# Patient Record
Sex: Female | Born: 1990 | Race: Black or African American | Hispanic: No | Smoking: Former smoker
Health system: Southern US, Community
[De-identification: ages and names within clinical notes are randomized; demographics above are authoritative.]

## PROBLEM LIST (undated history)

## (undated) ENCOUNTER — Inpatient Hospital Stay (HOSPITAL_COMMUNITY): Payer: Self-pay

## (undated) DIAGNOSIS — R87629 Unspecified abnormal cytological findings in specimens from vagina: Secondary | ICD-10-CM

## (undated) DIAGNOSIS — Z8619 Personal history of other infectious and parasitic diseases: Secondary | ICD-10-CM

## (undated) DIAGNOSIS — O139 Gestational [pregnancy-induced] hypertension without significant proteinuria, unspecified trimester: Secondary | ICD-10-CM

## (undated) DIAGNOSIS — R1011 Right upper quadrant pain: Secondary | ICD-10-CM

## (undated) DIAGNOSIS — K219 Gastro-esophageal reflux disease without esophagitis: Secondary | ICD-10-CM

## (undated) HISTORY — PX: NO PAST SURGERIES: SHX2092

## (undated) HISTORY — DX: Unspecified abnormal cytological findings in specimens from vagina: R87.629

## (undated) HISTORY — DX: Personal history of other infectious and parasitic diseases: Z86.19

---
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
## 2012-01-01 LAB — CBC
HCT: 26.9 % — ABNORMAL LOW (ref 36.0–46.0)
Hemoglobin: 9.1 g/dL — ABNORMAL LOW (ref 12.0–15.0)
MCH: 30.4 pg (ref 26.0–34.0)
MCHC: 33.8 g/dL (ref 30.0–36.0)
MCV: 90 fL (ref 78.0–100.0)
Platelets: 143 10*3/uL — ABNORMAL LOW (ref 150–400)
RBC: 2.99 MIL/uL — ABNORMAL LOW (ref 3.87–5.11)
RDW: 14.9 % (ref 11.5–15.5)
WBC: 8 10*3/uL (ref 4.0–10.5)

## 2012-01-01 LAB — COMPREHENSIVE METABOLIC PANEL
ALT: 48 U/L — ABNORMAL HIGH (ref 0–35)
AST: 43 U/L — ABNORMAL HIGH (ref 0–37)
Albumin: 2.3 g/dL — ABNORMAL LOW (ref 3.5–5.2)
Alkaline Phosphatase: 181 U/L — ABNORMAL HIGH (ref 39–117)
BUN: 6 mg/dL (ref 6–23)
CO2: 25 mEq/L (ref 19–32)
Calcium: 7.4 mg/dL — ABNORMAL LOW (ref 8.4–10.5)
Chloride: 102 mEq/L (ref 96–112)
Creatinine, Ser: 0.82 mg/dL (ref 0.50–1.10)
GFR calc Af Amer: >90 mL/min (ref >90)
GFR calc non Af Amer: >90 mL/min (ref >90)
Glucose, Bld: 92 mg/dL (ref 70–99)
Potassium: 3.2 mEq/L — ABNORMAL LOW (ref 3.5–5.1)
Sodium: 136 mEq/L (ref 135–145)
Total Bilirubin: 0.2 mg/dL — ABNORMAL LOW (ref 0.3–1.2)
Total Protein: 5.7 g/dL — ABNORMAL LOW (ref 6.0–8.3)

## 2012-01-01 LAB — LACTATE DEHYDROGENASE: LDH: 355 U/L — ABNORMAL HIGH (ref 94–250)

## 2012-01-01 MED ORDER — SODIUM CHLORIDE 0.9 % IJ SOLN: 3.0000 mL | INTRAMUSCULAR | Status: DC | PRN

## 2012-01-01 NOTE — Progress Notes (Signed)
Pt for transfer to [facility].  Report called to Lafonda Mosses, RN.

## 2012-01-01 NOTE — Progress Notes (Signed)
Dr Clearance Coots called and notified of pt's BP's overnight, BP's now and pt's complaint of current Headache and BP after morning Labetalol. Order received to transfer to floor.

## 2012-01-01 NOTE — Progress Notes (Signed)
Post Partum Day 1 Subjective: no complaints, up ad lib, voiding and tolerating PO  Objective: Blood pressure 132/90, pulse 90, temperature 98.6 F (37 C), temperature source Oral, resp. rate 20, height 5\' 5"  (1.651 m), weight 75.615 kg (166 lb 11.2 oz), SpO2 100.00%, unknown if currently breastfeeding.  Physical Exam:  General: alert and no distress Lochia: appropriate Uterine Fundus: firm Incision: healing well DVT Evaluation: No evidence of DVT seen on physical exam.   Basename 01/01/12 0515 12/31/11 0530  HGB 9.1* 11.1*  HCT 26.9* 33.7*    Assessment/Plan: Plan for discharge tomorrow   LOS: 2 days   HARPER,CHARLES A 01/01/2012, 12:17 PM

## 2012-01-01 NOTE — Progress Notes (Signed)
Pt educated on Tdap Booste. FOB and pt undecided and are not sure if pt wants to receive it or not. Returned to refrig and pt advised to let Rn know before discharge if she changes her mind.

## 2012-01-02 MED ORDER — IBUPROFEN 600 MG PO TABS: 600.0000 mg | ORAL_TABLET | Freq: Four times a day (QID) | ORAL | Status: DC

## 2012-01-02 MED ORDER — OXYCODONE-ACETAMINOPHEN 5-325 MG PO TABS: 1.0000 | ORAL_TABLET | ORAL | Status: DC | PRN

## 2012-01-02 MED ORDER — MEDROXYPROGESTERONE ACETATE 150 MG/ML IM SUSP
150.0000 mg | Freq: Once | INTRAMUSCULAR | Status: AC
  Administered 2012-01-02: 150 mg via INTRAMUSCULAR
  Filled 2012-01-02: qty 1

## 2012-01-02 MED ORDER — LABETALOL HCL 200 MG PO TABS: 200.0000 mg | ORAL_TABLET | Freq: Three times a day (TID) | ORAL | Status: DC

## 2012-01-02 MED ORDER — FUSION PLUS PO CAPS: 1.0000 | ORAL_CAPSULE | Freq: Every day | ORAL | Status: DC

## 2012-01-02 NOTE — Progress Notes (Signed)
Post Partum Day 2 Subjective: no complaints  Objective: Blood pressure 135/87, pulse 82, temperature 98.5 F (36.9 C), temperature source Oral, resp. rate 18, height 5\' 5"  (1.651 m), weight 75.615 kg (166 lb 11.2 oz), SpO2 100.00%, unknown if currently breastfeeding.  Physical Exam:  General: alert and no distress Lochia: appropriate Uterine Fundus: firm Incision: healing well DVT Evaluation: No evidence of DVT seen on physical exam.   Basename 01/01/12 0515 12/31/11 0530  HGB 9.1* 11.1*  HCT 26.9* 33.7*    Assessment/Plan: Discharge home.  Anemia.  Clinically stable.  Iron Rx.   LOS: 3 days   HARPER,CHARLES A 01/02/2012, 4:24 AM

## 2012-01-06 NOTE — Discharge Summary (Signed)
Obstetric Discharge Summary Reason for Admission: onset of labor Prenatal Procedures: ultrasound Intrapartum Procedures: spontaneous vaginal delivery Postpartum Procedures: none Complications-Operative and Postpartum: none Hemoglobin  Date Value Range Status  01/01/2012 9.1* 12.0 - 15.0 g/dL Final     REPEATED TO VERIFY     DELTA CHECK NOTED     HCT  Date Value Range Status  01/01/2012 26.9* 36.0 - 46.0 % Final    Physical Exam:  General: alert and no distress Lochia: appropriate Uterine Fundus: firm Incision: none DVT Evaluation: No evidence of DVT seen on physical exam.  Discharge Diagnoses: Term Pregnancy-delivered  Discharge Information: Date: 01/06/2012 Activity: pelvic rest Diet: routine Medications: PNV, Ibuprofen, Colace and Percocet Condition: stable Instructions: refer to practice specific booklet Discharge to: home Follow-up Information    Follow up with Antionette Char A, MD. Schedule an appointment as soon as possible for a visit in 6 weeks.   Contact information:   [address], [address] [place] [address] [phone]          Newborn Data: Live born female  Birth Weight: 4 lb 9.2 oz (2075 g) APGAR: 8, 9  Home with mother.  Anyra Kaufman A 01/06/2012, 5:40 PM

## 2012-06-17 ENCOUNTER — Emergency Department (HOSPITAL_COMMUNITY)
Admission: EM | Admit: 2012-06-17 | Discharge: 2012-06-17 | Disposition: A | Discharge status: Home / Self Care | Payer: Medicaid Other | Attending: Emergency Medicine | Admitting: Emergency Medicine

## 2012-06-17 ENCOUNTER — Encounter (HOSPITAL_COMMUNITY): Payer: Self-pay | Admitting: *Deleted

## 2012-06-17 DIAGNOSIS — S01111A Laceration without foreign body of right eyelid and periocular area, initial encounter: Secondary | ICD-10-CM

## 2012-06-17 DIAGNOSIS — S058X9A Other injuries of unspecified eye and orbit, initial encounter: Secondary | ICD-10-CM | POA: Insufficient documentation

## 2012-06-17 DIAGNOSIS — Z8619 Personal history of other infectious and parasitic diseases: Secondary | ICD-10-CM | POA: Insufficient documentation

## 2012-06-17 DIAGNOSIS — S0181XA Laceration without foreign body of other part of head, initial encounter: Secondary | ICD-10-CM

## 2012-06-17 LAB — POCT PREGNANCY, URINE: Preg Test, Ur: NEGATIVE

## 2012-06-17 MED ORDER — TETANUS-DIPHTH-ACELL PERTUSSIS 5-2.5-18.5 LF-MCG/0.5 IM SUSP
0.5000 mL | Freq: Once | INTRAMUSCULAR | Status: AC
  Administered 2012-06-17: 0.5 mL via INTRAMUSCULAR
  Filled 2012-06-17: qty 0.5

## 2012-06-17 MED ORDER — AMOXICILLIN-POT CLAVULANATE 875-125 MG PO TABS
1.0000 | ORAL_TABLET | Freq: Once | ORAL | Status: AC
  Administered 2012-06-17: 1 via ORAL
  Filled 2012-06-17: qty 1

## 2012-06-17 MED ORDER — PROPARACAINE HCL 0.5 % OP SOLN
1.0000 [drp] | Freq: Once | OPHTHALMIC | Status: AC
  Administered 2012-06-17: 1 [drp] via OPHTHALMIC
  Filled 2012-06-17: qty 15

## 2012-06-17 MED ORDER — IBUPROFEN 800 MG PO TABS
800.0000 mg | ORAL_TABLET | Freq: Once | ORAL | Status: AC
  Administered 2012-06-17: 800 mg via ORAL
  Filled 2012-06-17: qty 1

## 2012-06-17 MED ORDER — HYDROCODONE-ACETAMINOPHEN 5-325 MG PO TABS: 2.0000 | ORAL_TABLET | ORAL | Status: DC | PRN

## 2012-06-17 MED ORDER — FLUORESCEIN SODIUM 1 MG OP STRP
1.0000 | ORAL_STRIP | Freq: Once | OPHTHALMIC | Status: AC
  Administered 2012-06-17: 1 via OPHTHALMIC
  Filled 2012-06-17: qty 1

## 2012-06-17 MED ORDER — AMOXICILLIN-POT CLAVULANATE 875-125 MG PO TABS: 1.0000 | ORAL_TABLET | Freq: Two times a day (BID) | ORAL | Status: DC

## 2012-06-17 MED ORDER — PROPARACAINE HCL 0.5 % OP SOLN: 1.0000 [drp] | Freq: Once | OPHTHALMIC | Status: DC
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
## 2012-06-17 NOTE — ED Notes (Signed)
MD at bedside suturing patient.

## 2012-06-17 NOTE — ED Notes (Signed)
Pt verbalized understanding of going immediately to Eye doctor. R eye dressing changed to wet to dry.
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
## 2012-06-19 NOTE — ED Provider Notes (Signed)
Medical screening examination/treatment/procedure(s) were performed by non-physician practitioner and as supervising physician I was immediately available for consultation/collaboration.  Jasmine Awe, MD 06/19/12 2337

## 2014-02-05 ENCOUNTER — Encounter (HOSPITAL_COMMUNITY): Payer: Self-pay | Admitting: *Deleted

## 2014-04-06 NOTE — L&D Delivery Note (Cosign Needed)
Delivery Note This is a 24 year old G 3 P2 who was admitted for Active phase labor.. She progressed normally with epidural to the second stage of labor.  She pushed for 5 min. A nuchal cord was not identified.  At 12:08 PM a viable female was delivered via Vaginal, Spontaneous Delivery (Presentation: Right Occiput Anterior).  APGAR: 9, 9.  Infant placed on maternal abdomen.  Delayed cord clamping for about 5 minutes was done.  Cord double clamped and cut.    The placenta delivered spontaneously, shultz, with a 3 vessel cord.  Inspection revealed no lacerations.  The uterus was firm bleeding stable.  EBL was 250.    Placenta and umbilical artery blood gas were not sent.  There were no complications during the procedure.  Mom and baby skin to skin following delivery. Left in stable condition.   Placenta status: Intact, Spontaneous.  Cord: 3 vessels with the following complications: None.  Cord pH: N/A.   Anesthesia: Epidural  Episiotomy: None Lacerations: None Suture Repair: none Est. Blood Loss (mL): 250  Mom to postpartum.  Baby to Couplet care / Skin to Skin.  Roe Coombs, CNM 11/23/2014, 1:08 PM

## 2014-04-12 ENCOUNTER — Telehealth: Payer: Self-pay | Admitting: Obstetrics

## 2014-04-13 ENCOUNTER — Other Ambulatory Visit: Payer: Self-pay

## 2014-04-13 NOTE — Telephone Encounter (Signed)
[phone] - still unable to reach patient

## 2014-06-29 ENCOUNTER — Inpatient Hospital Stay (HOSPITAL_COMMUNITY)
Admission: AD | Admit: 2014-06-29 | Discharge: 2014-06-29 | Disposition: A | Discharge status: Home / Self Care | Payer: Medicaid Other | Source: Ambulatory Visit | Attending: Family Medicine | Admitting: Family Medicine

## 2014-06-29 ENCOUNTER — Encounter (HOSPITAL_COMMUNITY): Payer: Self-pay

## 2014-06-29 DIAGNOSIS — O0932 Supervision of pregnancy with insufficient antenatal care, second trimester: Secondary | ICD-10-CM | POA: Diagnosis not present

## 2014-06-29 DIAGNOSIS — Z3A17 17 weeks gestation of pregnancy: Secondary | ICD-10-CM | POA: Diagnosis not present

## 2014-06-29 DIAGNOSIS — S3991XA Unspecified injury of abdomen, initial encounter: Secondary | ICD-10-CM | POA: Diagnosis present

## 2014-06-29 DIAGNOSIS — O98312 Other infections with a predominantly sexual mode of transmission complicating pregnancy, second trimester: Secondary | ICD-10-CM | POA: Diagnosis not present

## 2014-06-29 DIAGNOSIS — A599 Trichomoniasis, unspecified: Secondary | ICD-10-CM | POA: Diagnosis not present

## 2014-06-29 DIAGNOSIS — O0933 Supervision of pregnancy with insufficient antenatal care, third trimester: Secondary | ICD-10-CM

## 2014-06-29 DIAGNOSIS — B373 Candidiasis of vulva and vagina: Secondary | ICD-10-CM | POA: Diagnosis not present

## 2014-06-29 DIAGNOSIS — B3731 Acute candidiasis of vulva and vagina: Secondary | ICD-10-CM

## 2014-06-29 LAB — URINALYSIS, ROUTINE W REFLEX MICROSCOPIC
Bilirubin Urine: NEGATIVE
Glucose, UA: NEGATIVE mg/dL
Hgb urine dipstick: NEGATIVE
Ketones, ur: NEGATIVE mg/dL
Nitrite: NEGATIVE
Protein, ur: NEGATIVE mg/dL
Specific Gravity, Urine: 1.025 (ref 1.005–1.030)
Urobilinogen, UA: 0.2 mg/dL (ref 0.0–1.0)
pH: 6.5 (ref 5.0–8.0)

## 2014-06-29 LAB — WET PREP, GENITAL
Clue Cells Wet Prep HPF POC: NONE SEEN
Trich, Wet Prep: NONE SEEN

## 2014-06-29 LAB — URINE MICROSCOPIC-ADD ON

## 2014-06-29 MED ORDER — METRONIDAZOLE 500 MG PO TABS: 500.0000 mg | ORAL_TABLET | Freq: Two times a day (BID) | ORAL | Status: DC

## 2014-06-29 MED ORDER — FLUCONAZOLE 150 MG PO TABS: 150.0000 mg | ORAL_TABLET | Freq: Once | ORAL | Status: DC

## 2014-06-29 NOTE — Progress Notes (Signed)
D. Poe CNM in earlier to discuss d/c plan. Written and verbal d/c instructions given and understanding voiced
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
## 2014-06-29 NOTE — Discharge Instructions (Signed)

## 2014-07-01 LAB — HIV ANTIBODY (ROUTINE TESTING W REFLEX): HIV Screen 4th Generation wRfx: NONREACTIVE

## 2014-07-02 LAB — GC/CHLAMYDIA PROBE AMP (~~LOC~~) NOT AT ARMC
Chlamydia: POSITIVE — AB
Neisseria Gonorrhea: NEGATIVE

## 2014-07-03 ENCOUNTER — Telehealth (HOSPITAL_COMMUNITY): Payer: Self-pay | Admitting: *Deleted

## 2014-07-03 DIAGNOSIS — A749 Chlamydial infection, unspecified: Secondary | ICD-10-CM

## 2014-07-03 DIAGNOSIS — O98812 Other maternal infectious and parasitic diseases complicating pregnancy, second trimester: Principal | ICD-10-CM

## 2014-07-03 MED ORDER — AZITHROMYCIN 500 MG PO TABS
ORAL_TABLET | ORAL | Status: DC
Start: 2014-07-03 — End: 2014-07-17
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
## 2014-07-12 ENCOUNTER — Encounter: Payer: Self-pay | Admitting: Certified Nurse Midwife

## 2014-07-12 ENCOUNTER — Other Ambulatory Visit (INDEPENDENT_AMBULATORY_CARE_PROVIDER_SITE_OTHER): Payer: Medicaid Other

## 2014-07-12 ENCOUNTER — Ambulatory Visit (INDEPENDENT_AMBULATORY_CARE_PROVIDER_SITE_OTHER): Payer: Medicaid Other | Admitting: Certified Nurse Midwife

## 2014-07-12 VITALS — BP 115/71 | HR 108 | Wt 152.0 lb

## 2014-07-12 VITALS — BP 115/71 | HR 108 | Temp 98.3°F | Wt 152.0 lb

## 2014-07-12 DIAGNOSIS — Z3200 Encounter for pregnancy test, result unknown: Secondary | ICD-10-CM

## 2014-07-12 DIAGNOSIS — O0932 Supervision of pregnancy with insufficient antenatal care, second trimester: Secondary | ICD-10-CM

## 2014-07-12 DIAGNOSIS — Z3492 Encounter for supervision of normal pregnancy, unspecified, second trimester: Secondary | ICD-10-CM

## 2014-07-12 DIAGNOSIS — O26892 Other specified pregnancy related conditions, second trimester: Secondary | ICD-10-CM

## 2014-07-12 DIAGNOSIS — Z3482 Encounter for supervision of other normal pregnancy, second trimester: Secondary | ICD-10-CM

## 2014-07-12 DIAGNOSIS — S3991XD Unspecified injury of abdomen, subsequent encounter: Secondary | ICD-10-CM

## 2014-07-12 DIAGNOSIS — A599 Trichomoniasis, unspecified: Secondary | ICD-10-CM

## 2014-07-12 DIAGNOSIS — N898 Other specified noninflammatory disorders of vagina: Secondary | ICD-10-CM

## 2014-07-12 LAB — POCT URINALYSIS DIPSTICK
Bilirubin, UA: NEGATIVE
Blood, UA: NEGATIVE
Glucose, UA: NEGATIVE
Ketones, UA: NEGATIVE
Leukocytes, UA: NEGATIVE
Nitrite, UA: NEGATIVE
Protein, UA: NEGATIVE
Spec Grav, UA: 1.015
Urobilinogen, UA: NEGATIVE
pH, UA: 7

## 2014-07-12 LAB — POCT URINE PREGNANCY: Preg Test, Ur: POSITIVE

## 2014-07-12 MED ORDER — OB COMPLETE PETITE 35-5-1-200 MG PO CAPS: 1.0000 | ORAL_CAPSULE | Freq: Every day | ORAL | Status: DC

## 2014-07-12 MED ORDER — FLUCONAZOLE 100 MG PO TABS: 100.0000 mg | ORAL_TABLET | Freq: Once | ORAL | Status: DC

## 2014-07-12 MED ORDER — TINIDAZOLE 500 MG PO TABS: 2.0000 g | ORAL_TABLET | Freq: Every day | ORAL | Status: AC
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
## 2014-07-12 NOTE — Patient Instructions (Signed)
Prenatal Care  WHAT IS PRENATAL CARE?  Prenatal care means health care during your pregnancy, before your baby is born. It is very important to take care of yourself and your baby during your pregnancy by:   Getting early prenatal care. If you know you are pregnant, or think you might be pregnant, call your health care provider as soon as possible. Schedule a visit for a prenatal exam.  Getting regular prenatal care. Follow your health care provider's schedule for blood and other necessary tests. Do not miss appointments.  Doing everything you can to keep yourself and your baby healthy during your pregnancy.  Getting complete care. Prenatal care should include evaluation of the medical, dietary, educational, psychological, and social needs of you and your significant other. The medical and genetic history of your family and the family of your baby's father should be discussed with your health care provider.  Discussing with your health care provider:  Prescription, over-the-counter, and herbal medicines that you take.  Any history of substance abuse, alcohol use, smoking, and illegal drug use.  Any history of domestic abuse and violence.  Immunizations you have received.  Your nutrition and diet.  The amount of exercise you do.  Any environmental and occupational hazards to which you are exposed.  History of sexually transmitted infections for both you and your partner.  Previous pregnancies you have had. WHY IS PRENATAL CARE SO IMPORTANT?  By regularly seeing your health care provider, you help ensure that problems can be identified early so that they can be treated as soon as possible. Other problems might be prevented. Many studies have shown that early and regular prenatal care is important for the health of mothers and their babies.  HOW CAN I TAKE CARE OF MYSELF WHILE I AM PREGNANT?  Here are ways to take care of yourself and your baby:   Start or continue taking your  multivitamin with 400 micrograms (mcg) of folic acid every day.  Get early and regular prenatal care. It is very important to see a health care provider during your pregnancy. Your health care provider will check at each visit to make sure that you and your baby are healthy. If there are any problems, action can be taken right away to help you and your baby.  Eat a healthy diet that includes:  Fruits.  Vegetables.  Foods low in saturated fat.  Whole grains.  Calcium-rich foods, such as milk, yogurt, and hard cheeses.  Drink 6-8 glasses of liquids a day.  Unless your health care provider tells you not to, try to be physically active for 30 minutes, most days of the week. If you are pressed for time, you can get your activity in through 10-minute segments, three times a day.  Do not smoke, drink alcohol, or use drugs. These can cause long-term damage to your baby. Talk with your health care provider about steps to take to stop smoking. Talk with a member of your faith community, a counselor, a trusted friend, or your health care provider if you are concerned about your alcohol or drug use.  Ask your health care provider before taking any medicine, even over-the-counter medicines. Some medicines are not safe to take during pregnancy.  Get plenty of rest and sleep.  Avoid hot tubs and saunas during pregnancy.  Do not have X-rays taken unless absolutely necessary and with the recommendation of your health care provider. A lead shield can be placed on your abdomen to protect your baby when   X-rays are taken in other parts of your body.  Do not empty the cat litter when you are pregnant. It may contain a parasite that causes an infection called toxoplasmosis, which can cause birth defects. Also, use gloves when working in garden areas used by cats.  Do not eat uncooked or undercooked meats or fish.  Do not eat soft, mold-ripened cheeses (Brie, Camembert, and chevre) or soft, blue-veined  cheese (Danish blue and Roquefort).  Stay away from toxic chemicals like:  Insecticides.  Solvents (some cleaners or paint thinners).  Lead.  Mercury.  Sexual intercourse may continue until the end of the pregnancy, unless you have a medical problem or there is a problem with the pregnancy and your health care provider tells you not to.  Do not wear high-heel shoes, especially during the second half of the pregnancy. You can lose your balance and fall.  Do not take long trips, unless absolutely necessary. Be sure to see your health care provider before going on the trip.  Do not sit in one position for more than 2 hours when on a trip.  Take a copy of your medical records when going on a trip. Know where a hospital is located in the city you are visiting, in case of an emergency.  Most dangerous household products will have pregnancy warnings on their labels. Ask your health care provider about products if you are unsure.  Limit or eliminate your caffeine intake from coffee, tea, sodas, medicines, and chocolate.  Many women continue working through pregnancy. Staying active might help you stay healthier. If you have a question about the safety or the hours you work at your particular job, talk with your health care provider.  Get informed:  Read books.  Watch videos.  Go to childbirth classes for you and your significant other.  Talk with experienced moms.  Ask your health care provider about childbirth education classes for you and your partner. Classes can help you and your partner prepare for the birth of your baby.  Ask about a baby doctor (pediatrician) and methods and pain medicine for labor, delivery, and possible cesarean delivery. HOW OFTEN SHOULD I SEE MY HEALTH CARE PROVIDER DURING PREGNANCY?  Your health care provider will give you a schedule for your prenatal visits. You will have visits more often as you get closer to the end of your pregnancy. An average  pregnancy lasts about 40 weeks.  A typical schedule includes visiting your health care provider:   About once each month during your first 6 months of pregnancy.  Every 2 weeks during the next 2 months.  Weekly in the last month, until the delivery date. Your health care provider will probably want to see you more often if:  You are older than 35 years.  Your pregnancy is high risk because you have certain health problems or problems with the pregnancy, such as:  Diabetes.  High blood pressure.  The baby is not growing on schedule, according to the dates of the pregnancy. Your health care provider will do special tests to make sure you and your baby are not having any serious problems. WHAT HAPPENS DURING PRENATAL VISITS?   At your first prenatal visit, your health care provider will do a physical exam and talk to you about your health history and the health history of your partner and your family. Your health care provider will be able to tell you what date to expect your baby to be born on.  Your   first physical exam will include checks of your blood pressure, measurements of your height and weight, and an exam of your pelvic organs. Your health care provider will do a Pap test if you have not had one recently and will do cultures of your cervix to make sure there is no infection.  At each prenatal visit, there will be tests of your blood, urine, blood pressure, weight, and the progress of the baby will be checked.  At your later prenatal visits, your health care provider will check how you are doing and how your baby is developing. You may have a number of tests done as your pregnancy progresses.  Ultrasound exams are often used to check on your baby's growth and health.  You may have more urine and blood tests, as well as special tests, if needed. These may include amniocentesis to examine fluid in the pregnancy sac, stress tests to check how the baby responds to contractions, or a  biophysical profile to measure your baby's well-being. Your health care provider will explain the tests and why they are necessary.  You should be tested for high blood sugar (gestational diabetes) between the 24th and 28th weeks of your pregnancy.  You should discuss with your health care provider your plans to breastfeed or bottle-feed your baby.  Each visit is also a chance for you to learn about staying healthy during pregnancy and to ask questions. Document Released: 03/26/2003 Document Revised: 03/28/2013 Document Reviewed: 06/07/2013 [facility] Patient Information 2015 [place], [place]. This information is not intended to replace advice given to you by your health care provider. Make sure you discuss any questions you have with your health care provider. Preterm Labor Information Preterm labor is when labor starts at less than 37 weeks of pregnancy. The normal length of a pregnancy is 39 to 41 weeks. CAUSES Often, there is no identifiable underlying cause as to why a woman goes into preterm labor. One of the most common known causes of preterm labor is infection. Infections of the uterus, cervix, vagina, amniotic sac, bladder, kidney, or even the lungs (pneumonia) can cause labor to start. Other suspected causes of preterm labor include:   Urogenital infections, such as yeast infections and bacterial vaginosis.   Uterine abnormalities (uterine shape, uterine septum, fibroids, or bleeding from the placenta).   A cervix that has been operated on (it may fail to stay closed).   Malformations in the fetus.   Multiple gestations (twins, triplets, and so on).   Breakage of the amniotic sac.  RISK FACTORS  Having a previous history of preterm labor.   Having premature rupture of membranes (PROM).   Having a placenta that covers the opening of the cervix (placenta previa).   Having a placenta that separates from the uterus (placental abruption).   Having a cervix that is too  weak to hold the fetus in the uterus (incompetent cervix).   Having too much fluid in the amniotic sac (polyhydramnios).   Taking illegal drugs or smoking while pregnant.   Not gaining enough weight while pregnant.   Being younger than 5118 and older than 22 years old.   Having a low socioeconomic status.   Being African American. SYMPTOMS Signs and symptoms of preterm labor include:   Menstrual-like cramps, abdominal pain, or back pain.  Uterine contractions that are regular, as frequent as six in an hour, regardless of their intensity (may be mild or painful).  Contractions that start on the top of the uterus and spread down  to the lower abdomen and back.   A sense of increased pelvic pressure.   A watery or bloody mucus discharge that comes from the vagina.  TREATMENT Depending on the length of the pregnancy and other circumstances, your health care provider may suggest bed rest. If necessary, there are medicines that can be given to stop contractions and to mature the fetal lungs. If labor happens before 34 weeks of pregnancy, a prolonged hospital stay may be recommended. Treatment depends on the condition of both you and the fetus.  WHAT SHOULD YOU DO IF YOU THINK YOU ARE IN PRETERM LABOR? Call your health care provider right away. You will need to go to the hospital to get checked immediately. HOW CAN YOU PREVENT PRETERM LABOR IN FUTURE PREGNANCIES? You should:   Stop smoking if you smoke.  Maintain healthy weight gain and avoid chemicals and drugs that are not necessary.  Be watchful for any type of infection.  Inform your health care provider if you have a known history of preterm labor. Document Released: 06/13/2003 Document Revised: 11/23/2012 Document Reviewed: 04/25/2012 [facility] Patient Information 2015 [place], [place]. This information is not intended to replace advice given to you by your health care provider. Make sure you discuss any questions you have  with your health care provider. Sexually Transmitted Disease A sexually transmitted disease (STD) is a disease or infection that may be passed (transmitted) from person to person, usually during sexual activity. This may happen by way of saliva, semen, blood, vaginal mucus, or urine. Common STDs include:   Gonorrhea.   Chlamydia.   Syphilis.   HIV and AIDS.   Genital herpes.   Hepatitis B and C.   Trichomonas.   Human papillomavirus (HPV).   Pubic lice.   Scabies.  Mites.  Bacterial vaginosis. WHAT ARE CAUSES OF STDs? An STD may be caused by bacteria, a virus, or parasites. STDs are often transmitted during sexual activity if one person is infected. However, they may also be transmitted through nonsexual means. STDs may be transmitted after:   Sexual intercourse with an infected person.   Sharing sex toys with an infected person.   Sharing needles with an infected person or using unclean piercing or tattoo needles.  Having intimate contact with the genitals, mouth, or rectal areas of an infected person.   Exposure to infected fluids during birth. WHAT ARE THE SIGNS AND SYMPTOMS OF STDs? Different STDs have different symptoms. Some people may not have any symptoms. If symptoms are present, they may include:   Painful or bloody urination.   Pain in the pelvis, abdomen, vagina, anus, throat, or eyes.   A skin rash, itching, or irritation.  Growths, ulcerations, blisters, or sores in the genital and anal areas.  Abnormal vaginal discharge with or without bad odor.   Penile discharge in men.   Fever.   Pain or bleeding during sexual intercourse.   Swollen glands in the groin area.   Yellow skin and eyes (jaundice). This is seen with hepatitis.   Swollen testicles.  Infertility.  Sores and blisters in the mouth. HOW ARE STDs DIAGNOSED? To make a diagnosis, your health care provider may:   Take a medical history.   Perform a  physical exam.   Take a sample of any discharge to examine.  Swab the throat, cervix, opening to the penis, rectum, or vagina for testing.  Test a sample of your first morning urine.   Perform blood tests.   Perform a Pap test,  if this applies.   Perform a colposcopy.   Perform a laparoscopy.  HOW ARE STDs TREATED? Treatment depends on the STD. Some STDs may be treated but not cured.   Chlamydia, gonorrhea, trichomonas, and syphilis can be cured with antibiotic medicine.   Genital herpes, hepatitis, and HIV can be treated, but not cured, with prescribed medicines. The medicines lessen symptoms.   Genital warts from HPV can be treated with medicine or by freezing, burning (electrocautery), or surgery. Warts may come back.   HPV cannot be cured with medicine or surgery. However, abnormal areas may be removed from the cervix, vagina, or vulva.   If your diagnosis is confirmed, your recent sexual partners need treatment. This is true even if they are symptom-free or have a negative culture or evaluation. They should not have sex until their health care providers say it is okay. HOW CAN I REDUCE MY RISK OF GETTING AN STD? Take these steps to reduce your risk of getting an STD:  Use latex condoms, dental dams, and water-soluble lubricants during sexual activity. Do not use petroleum jelly or oils.  Avoid having multiple sex partners.  Do not have sex with someone who has other sex partners.  Do not have sex with anyone you do not know or who is at high risk for an STD.  Avoid risky sex practices that can break your skin.  Do not have sex if you have open sores on your mouth or skin.  Avoid drinking too much alcohol or taking illegal drugs. Alcohol and drugs can affect your judgment and put you in a vulnerable position.  Avoid engaging in oral and anal sex acts.  Get vaccinated for HPV and hepatitis. If you have not received these vaccines in the past, talk to your  health care provider about whether one or both might be right for you.   If you are at risk of being infected with HIV, it is recommended that you take a prescription medicine daily to prevent HIV infection. This is called pre-exposure prophylaxis (PrEP). You are considered at risk if:  You are a man who has sex with other men (MSM).  You are a heterosexual man or woman and are sexually active with more than one partner.  You take drugs by injection.  You are sexually active with a partner who has HIV.  Talk with your health care provider about whether you are at high risk of being infected with HIV. If you choose to begin PrEP, you should first be tested for HIV. You should then be tested every 3 months for as long as you are taking PrEP.  WHAT SHOULD I DO IF I THINK I HAVE AN STD?  See your health care provider.   Tell your sexual partner(s). They should be tested and treated for any STDs.  Do not have sex until your health care provider says it is okay. WHEN SHOULD I GET IMMEDIATE MEDICAL CARE? Contact your health care provider right away if:   You have severe abdominal pain.  You are a man and notice swelling or pain in your testicles.  You are a woman and notice swelling or pain in your vagina. Document Released: 06/13/2002 Document Revised: 03/28/2013 Document Reviewed: 10/11/2012 [facility] Patient Information 2015 [place], [place]. This information is not intended to replace advice given to you by your health care provider. Make sure you discuss any questions you have with your health care provider. AFP Maternal This is a routine screen (tests) used  to check for fetal abnormalities such as Down syndrome and neural tube defects. Down syndrome is a chromosomal abnormality, sometimes called Trisomy 80. Neural tube defects are serious birth defects. The brain, spinal cord, or their coverings do not develop completely. Women should be tested in the 15th to 20th week of pregnancy.  The msAFP screen involves three or four tests that measure substances found in the blood that make the testing better. During development, AFP levels in fetal blood and amniotic fluid rise until about 12 weeks. The levels then gradually fall until birth. AFP is a protein produce by fetal tissue. AFP crosses the placenta and appears in the maternal blood. A baby with an open neural tube defect has an opening in its spine, head, or abdominal wall that allows higher than usual amounts of AFP to pass into the mother's blood. If a screen is positive, more tests are needed to make a diagnosis. These include ultrasound and perhaps amniocentesis (checking the fluid that surrounds the baby). These tests are used to help women and their caregivers make decisions about the management of their pregnancies. In pregnancies where the fetus is carrying the chromosomal defect that results in Down syndrome, the levels of AFP and unconjugated estriol tend to be low and hCG and inhibin A levels high.  PREPARATION FOR TEST Blood is drawn from a vein in your arm usually between the 15th and 20th weeks of pregnancy. Four different tests on your blood are done. These are AFP, hCG, unconjugated estriol, and inhibin A. The combination of tests produces a more accurate result. NORMAL FINDINGS   Adult: less than 40 ng/mL or less than 40 mg/L (SI units).  Child younger than 1 year: less than 30 ng/mL. Ranges are stratified by weeks of gestation and vary among laboratories. Ranges for normal findings may vary among different laboratories and hospitals. You should always check with your doctor after having lab work or other tests done to discuss the meaning of your test results and whether your values are considered within normal limits. MEANING OF TEST  These are screening tests. Not all fetal abnormalities will give positive test results. Of all women who have positive AFP screening results, only a very small number of them have  babies who actually have a neural tube defect or chromosomal abnormality. Your caregiver will go over the test results with you and discuss the importance and meaning of your results, as well as treatment options and the need for additional tests if necessary. OBTAINING THE TEST RESULTS It is your responsibility to obtain your test results. Ask the lab or department performing the test when and how you will get your results. Document Released: 04/14/2004 Document Revised: 08/07/2013 Document Reviewed: 02/25/2008 [facility] Patient Information 2015 [place], [place]. This information is not intended to replace advice given to you by your health care provider. Make sure you discuss any questions you have with your health care provider.

## 2014-07-13 LAB — OBSTETRIC PANEL
Antibody Screen: NEGATIVE
Basophils Absolute: 0 10*3/uL (ref 0.0–0.1)
Basophils Relative: 0 % (ref 0–1)
Eosinophils Absolute: 0.1 10*3/uL (ref 0.0–0.7)
Eosinophils Relative: 1 % (ref 0–5)
HCT: 33.4 % — ABNORMAL LOW (ref 36.0–46.0)
Hemoglobin: 11.2 g/dL — ABNORMAL LOW (ref 12.0–15.0)
Hepatitis B Surface Ag: NEGATIVE
Lymphocytes Relative: 24 % (ref 12–46)
Lymphs Abs: 1.8 10*3/uL (ref 0.7–4.0)
MCH: 30.9 pg (ref 26.0–34.0)
MCHC: 33.5 g/dL (ref 30.0–36.0)
MCV: 92.3 fL (ref 78.0–100.0)
MPV: 9.9 fL (ref 8.6–12.4)
Monocytes Absolute: 0.5 10*3/uL (ref 0.1–1.0)
Monocytes Relative: 7 % (ref 3–12)
Neutro Abs: 5.2 10*3/uL (ref 1.7–7.7)
Neutrophils Relative %: 68 % (ref 43–77)
Platelets: 270 10*3/uL (ref 150–400)
RBC: 3.62 MIL/uL — ABNORMAL LOW (ref 3.87–5.11)
RDW: 14.5 % (ref 11.5–15.5)
Rh Type: POSITIVE
Rubella: 1.27 Index — ABNORMAL HIGH (ref <0.90)
WBC: 7.6 10*3/uL (ref 4.0–10.5)

## 2014-07-13 LAB — HIV ANTIBODY (ROUTINE TESTING W REFLEX): HIV 1&2 Ab, 4th Generation: NONREACTIVE

## 2014-07-13 LAB — VITAMIN D 25 HYDROXY (VIT D DEFICIENCY, FRACTURES): Vit D, 25-Hydroxy: 17 ng/mL — ABNORMAL LOW (ref 30–100)

## 2014-07-13 LAB — VARICELLA ZOSTER ANTIBODY, IGG: Varicella IgG: 1117 Index — ABNORMAL HIGH (ref <135.00)

## 2014-07-14 LAB — CULTURE, OB URINE: Colony Count: 15000

## 2014-07-15 LAB — SURESWAB, VAGINOSIS/VAGINITIS PLUS
Atopobium vaginae: NOT DETECTED Log (cells/mL)
C. albicans, DNA: NOT DETECTED
C. glabrata, DNA: NOT DETECTED
C. parapsilosis, DNA: NOT DETECTED
C. trachomatis RNA, TMA: DETECTED — AB
C. tropicalis, DNA: NOT DETECTED
Gardnerella vaginalis: NOT DETECTED Log (cells/mL)
LACTOBACILLUS SPECIES: 7.4 Log (cells/mL)
MEGASPHAERA SPECIES: NOT DETECTED Log (cells/mL)
N. gonorrhoeae RNA, TMA: NOT DETECTED
T. vaginalis RNA, QL TMA: DETECTED — AB

## 2014-07-16 LAB — AFP, QUAD SCREEN
AFP: 57.7 ng/mL
Age Alone: 1:1150 {titer}
Curr Gest Age: 19.1 wks.days
Down Syndrome Scr Risk Est: 1:24300 {titer}
HCG, Total: 17.14 IU/mL
INH: 193.6 pg/mL
Interpretation-AFP: NEGATIVE
MoM for AFP: 1
MoM for INH: 1.12
MoM for hCG: 0.68
Open Spina bifida: NEGATIVE
Osb Risk: 1:26300 {titer}
Tri 18 Scr Risk Est: NEGATIVE
Trisomy 18 (Edward) Syndrome Interp.: 1:61900 {titer}
uE3 Mom: 1.3
uE3 Value: 2.08 ng/mL

## 2014-07-16 LAB — PAP IG W/ RFLX HPV ASCU

## 2014-07-16 LAB — HEMOGLOBINOPATHY EVALUATION
Hemoglobin Other: 0 %
Hgb A2 Quant: 2.8 % (ref 2.2–3.2)
Hgb A: 97.2 % (ref 96.8–97.8)
Hgb F Quant: 0 % (ref 0.0–2.0)
Hgb S Quant: 0 %

## 2014-07-17 ENCOUNTER — Other Ambulatory Visit: Payer: Self-pay | Admitting: Certified Nurse Midwife

## 2014-07-17 DIAGNOSIS — O98812 Other maternal infectious and parasitic diseases complicating pregnancy, second trimester: Principal | ICD-10-CM

## 2014-07-17 DIAGNOSIS — A5901 Trichomonal vulvovaginitis: Secondary | ICD-10-CM

## 2014-07-17 DIAGNOSIS — A749 Chlamydial infection, unspecified: Secondary | ICD-10-CM

## 2014-07-17 DIAGNOSIS — O23592 Infection of other part of genital tract in pregnancy, second trimester: Secondary | ICD-10-CM

## 2014-07-17 MED ORDER — AZITHROMYCIN 500 MG PO TABS: ORAL_TABLET | ORAL | Status: DC

## 2014-07-17 MED ORDER — TINIDAZOLE 500 MG PO TABS: 2.0000 g | ORAL_TABLET | Freq: Every day | ORAL | Status: AC

## 2014-07-18 ENCOUNTER — Other Ambulatory Visit: Payer: Self-pay | Admitting: Certified Nurse Midwife

## 2014-07-18 ENCOUNTER — Ambulatory Visit (INDEPENDENT_AMBULATORY_CARE_PROVIDER_SITE_OTHER): Payer: Medicaid Other

## 2014-07-18 DIAGNOSIS — Z3492 Encounter for supervision of normal pregnancy, unspecified, second trimester: Secondary | ICD-10-CM

## 2014-07-18 DIAGNOSIS — Z1389 Encounter for screening for other disorder: Secondary | ICD-10-CM

## 2014-07-18 DIAGNOSIS — O0932 Supervision of pregnancy with insufficient antenatal care, second trimester: Secondary | ICD-10-CM

## 2014-07-18 DIAGNOSIS — Z36 Encounter for antenatal screening of mother: Secondary | ICD-10-CM | POA: Diagnosis not present

## 2014-07-18 DIAGNOSIS — S3991XD Unspecified injury of abdomen, subsequent encounter: Secondary | ICD-10-CM

## 2014-07-18 LAB — HUMAN PAPILLOMAVIRUS, HIGH RISK: HPV DNA High Risk: DETECTED — AB

## 2014-07-18 LAB — US OB COMP + 14 WK

## 2014-07-26 ENCOUNTER — Other Ambulatory Visit: Payer: Self-pay | Admitting: *Deleted

## 2014-07-26 DIAGNOSIS — O98812 Other maternal infectious and parasitic diseases complicating pregnancy, second trimester: Secondary | ICD-10-CM

## 2014-07-26 DIAGNOSIS — A749 Chlamydial infection, unspecified: Secondary | ICD-10-CM

## 2014-07-26 DIAGNOSIS — A599 Trichomoniasis, unspecified: Secondary | ICD-10-CM

## 2014-07-26 MED ORDER — CEFIXIME 400 MG PO TABS: 400.0000 mg | ORAL_TABLET | Freq: Once | ORAL | Status: DC

## 2014-07-26 MED ORDER — TINIDAZOLE 500 MG PO TABS: 2.0000 g | ORAL_TABLET | Freq: Once | ORAL | Status: DC

## 2014-07-26 NOTE — Progress Notes (Unsigned)
See lab note.  

## 2014-08-09 ENCOUNTER — Encounter: Payer: Medicaid Other | Admitting: Certified Nurse Midwife

## 2014-08-14 ENCOUNTER — Ambulatory Visit (INDEPENDENT_AMBULATORY_CARE_PROVIDER_SITE_OTHER): Payer: Medicaid Other | Admitting: Obstetrics

## 2014-08-14 VITALS — BP 113/65 | HR 101 | Temp 97.8°F | Wt 155.0 lb

## 2014-08-14 DIAGNOSIS — Z3483 Encounter for supervision of other normal pregnancy, third trimester: Secondary | ICD-10-CM

## 2014-08-15 NOTE — Progress Notes (Signed)
Subjective:    Bethany Gallagher is a 24 y.o. female being seen today for her obstetrical visit. She is at 7668w0d gestation. Patient reports: no complaints . Fetal movement: normal.  Problem List Items Addressed This Visit    None    Visit Diagnoses    Encounter for supervision of other normal pregnancy in second trimester    -  Primary    Relevant Orders    POCT urinalysis dipstick      Patient Active Problem List   Diagnosis Date Noted  . Trichimoniasis 06/29/2014  . Insufficient prenatal care in second trimester 06/29/2014  . Normal delivery 12/31/2011  . Intrauterine growth restriction affecting antepartum care of mother 12/30/2011  . PIH (pregnancy induced hypertension) 12/30/2011   Objective:    BP 113/65 mmHg  Pulse 101  Temp(Src) 97.8 F (36.6 C)  Wt 155 lb (70.308 kg)  LMP 02/28/2014 (Exact Date) FHT: 150 BPM  Uterine Size: size equals dates     Assessment:    Pregnancy @ 2868w0d    Plan:    OBGCT: ordered.  Labs, problem list reviewed and updated 2 hr GTT planned Follow up in 4 weeks.

## 2014-09-12 ENCOUNTER — Other Ambulatory Visit: Payer: Medicaid Other

## 2014-09-12 ENCOUNTER — Encounter: Payer: Medicaid Other | Admitting: Certified Nurse Midwife

## 2014-09-13 ENCOUNTER — Other Ambulatory Visit: Payer: Medicaid Other

## 2014-09-13 ENCOUNTER — Ambulatory Visit (INDEPENDENT_AMBULATORY_CARE_PROVIDER_SITE_OTHER): Payer: Medicaid Other | Admitting: Certified Nurse Midwife

## 2014-09-13 VITALS — BP 109/69 | HR 104 | Temp 97.8°F | Wt 156.0 lb

## 2014-09-13 DIAGNOSIS — O2690 Pregnancy related conditions, unspecified, unspecified trimester: Secondary | ICD-10-CM | POA: Diagnosis not present

## 2014-09-13 DIAGNOSIS — Z3483 Encounter for supervision of other normal pregnancy, third trimester: Secondary | ICD-10-CM

## 2014-09-13 LAB — POCT URINALYSIS DIPSTICK
Bilirubin, UA: NEGATIVE
Blood, UA: NEGATIVE
Glucose, UA: NEGATIVE
Ketones, UA: NEGATIVE
Leukocytes, UA: NEGATIVE
Nitrite, UA: NEGATIVE
Protein, UA: NEGATIVE
Spec Grav, UA: 1.015
Urobilinogen, UA: NEGATIVE
pH, UA: 7

## 2014-09-13 LAB — CBC
HCT: 31.3 % — ABNORMAL LOW (ref 36.0–46.0)
Hemoglobin: 10.4 g/dL — ABNORMAL LOW (ref 12.0–15.0)
MCH: 29.6 pg (ref 26.0–34.0)
MCHC: 33.2 g/dL (ref 30.0–36.0)
MCV: 89.2 fL (ref 78.0–100.0)
MPV: 9.4 fL (ref 8.6–12.4)
Platelets: 239 10*3/uL (ref 150–400)
RBC: 3.51 MIL/uL — ABNORMAL LOW (ref 3.87–5.11)
RDW: 13.7 % (ref 11.5–15.5)
WBC: 6.5 10*3/uL (ref 4.0–10.5)

## 2014-09-13 NOTE — Patient Instructions (Signed)
How a Baby Grows During Pregnancy Pregnancy begins when the female's sperm enters the female's egg. This happens in the fallopian tube and is called fertilization. The fertilized egg is called an embryo until it reaches 9 weeks from the time of fertilization. From 9 weeks until birth it is called a fetus. The fertilized egg moves down the tube into the uterus and attaches to the inside lining of the uterus.  The pregnant woman is responsible for the growth of the embryo/fetus by supplying nourishment and oxygen through the blood stream and placenta to the developing fetus. The uterus becomes larger and pops out from the abdomen more and more as the fetus develops and grows. A normal pregnancy lasts 280 days, with a range of 259 to 294 days, or 40 weeks. The pregnancy is divided up into three trimesters:  First trimester - 0 to 13 weeks.  Second trimester - 14 to 27 weeks.  Third trimester - 28 to 40 weeks. The day your baby is supposed to be born is called estimated date of confinement [facility]) or estimated date of delivery (EDD). GROWTH OF THE BABY MONTH BY MONTH  First Month: The fertilized egg attaches to the inside of the uterus and certain cells will form the placenta and others will develop into the fetus. The arms, legs, brain, spinal cord, lungs, and heart begin to develop. At the end of the first month the heart begins to beat. The embryo weighs less than an ounce and is  inch long.  Second Month: The bones can be seen, the inner ear, eye lids, hands and feet form and genitals develop. By the end of 8 weeks, all of the major organs are developing. The fetus now weighs less than an ounce and is one inch (2.54 cm) long.  Third Month: Teeth buds appear, all the internal organs are forming, bones and muscles begin to grow, the spine can flex and the skin is transparent. Finger and toe nails begin to form, the hands develop faster than the feet and the arms are longer than the legs at this point.  The fetus weighs a little more than an ounce (0.03 kg) and is 3 inches (8.89cm) long.  Fourth Month: The placenta is completely formed. The external sex organs, neck, outer ear, eyebrows, eyelids and fingernails are formed. The fetus can hear, swallow, flex its arms and legs and the kidney begins to produce urine. The skin is covered with a white waxy coating (vernix) and very thin hair (lanugo) is present. The fetus weighs 5 ounces (0.14kg) and is 6 to 7 inches (16.51cm) long.  Fifth Month: The fetus moves around more and can be felt for the first time (called quickening), sleeps and wakes up at times, may begin to suck its finger and the nails grow to the end of the fingers. The gallbladder is now functioning and helps to digest the nutrients, eggs are formed in the female and the testicles begin to drop down from the abdomen to the scrotum in the female. The fetus weighs  to 1 pound (0.45kg) and is 10 inches (25.4cm) long.  Sixth Month: The lungs are formed but the fetus does not breath yet. The eyes open, the brain develops more quickly at this time, one can detect finger and toe prints and thicker hair grows. The fetus weighs 1 to 1 pounds (0.68kg) and is 12 inches (30.48cm) long.  Seventh Month: The fetus can hear and respond to sounds, kicks and stretches and can sense  changes in light. The fetus weighs 2 to 2 pounds (1.13kg) and is 14 inches (35.56cm) long.  Eight Month: All organs and body systems are fully developed and functioning. The bones get harder, taste buds develop and can taste sweet and sour flavors and the fetus may hiccup now. Different parts of the brain are developing and the skull remains soft for the brain to grow. The fetus weighs 5 pounds (2.27kg) and is 18 inches (45.75cm) long.  Ninth Month: The fetus gains about a half a pound a week, the lungs are fully developed, patterns of sleep develop and the head moves down into the bottom of the uterus called vertex. If the  buttocks moves into the bottom of the uterus, it is called a breech. The fetus weighs 6 to 9 pounds (2.72 to 4.08kg) and is 20 inches (50.8cm) long. You should be informed about your pregnancy, yourself and how the baby is developing as much as possible. Being informed helps you to enjoy this experience. It also gives you the sense to feel if something is not going right and when to ask questions. Talk to your caregiver when you have questions about your baby or your own body. Document Released: 09/09/2007 Document Revised: 06/15/2011 Document Reviewed: 09/09/2007 [facility] Patient Information 2015 [place], [place]. This information is not intended to replace advice given to you by your health care provider. Make sure you discuss any questions you have with your health care provider. Preterm Labor Information Preterm labor is when labor starts before you are [redacted] weeks pregnant. The normal length of pregnancy is 39 to 41 weeks.  CAUSES  The cause of preterm labor is not often known. The most common known cause is infection. RISK FACTORS  Having a history of preterm labor.  Having your water break before it should.  Having a placenta that covers the opening of the cervix.  Having a placenta that breaks away from the uterus.  Having a cervix that is too weak to hold the baby in the uterus.  Having too much fluid in the amniotic sac.  Taking drugs or smoking while pregnant.  Not gaining enough weight while pregnant.  Being younger than 53 and older than 22 years old.  Having a low income.  Being African American. SYMPTOMS  Period-like cramps, belly (abdominal) pain, or back pain.  Contractions that are regular, as often as six in an hour. They may be mild or painful.  Contractions that start at the top of the belly. They then move to the lower belly and back.  Lower belly pressure that seems to get stronger.  Bleeding from the vagina.  Fluid leaking from the vagina. TREATMENT    Treatment depends on:  Your condition.  The condition of your baby.  How many weeks pregnant you are. Your doctor may have you:  Take medicine to stop contractions.  Stay in bed except to use the restroom (bed rest).  Stay in the hospital. WHAT SHOULD YOU DO IF YOU THINK YOU ARE IN PRETERM LABOR? Call your doctor right away. You need to go to the hospital right away.  HOW CAN YOU PREVENT PRETERM LABOR IN FUTURE PREGNANCIES?  Stop smoking, if you smoke.  Maintain healthy weight gain.  Do not take drugs or be around chemicals that are not needed.  Tell your doctor if you think you have an infection.  Tell your doctor if you had a preterm labor before. Document Released: 06/19/2008 Document Revised: 01/11/2013 Document Reviewed: 06/19/2008 [employer] Patient  Information 2015 [place], [place]. This information is not intended to replace advice given to you by your health care provider. Make sure you discuss any questions you have with your health care provider. Second Trimester of Pregnancy The second trimester is from week 13 through week 28, months 4 through 6. The second trimester is often a time when you feel your best. Your body has also adjusted to being pregnant, and you begin to feel better physically. Usually, morning sickness has lessened or quit completely, you may have more energy, and you may have an increase in appetite. The second trimester is also a time when the fetus is growing rapidly. At the end of the sixth month, the fetus is about 9 inches long and weighs about 1 pounds. You will likely begin to feel the baby move (quickening) between 18 and 20 weeks of the pregnancy. BODY CHANGES Your body goes through many changes during pregnancy. The changes vary from woman to woman.   Your weight will continue to increase. You will notice your lower abdomen bulging out.  You may begin to get stretch marks on your hips, abdomen, and breasts.  You may develop headaches that  can be relieved by medicines approved by your health care provider.  You may urinate more often because the fetus is pressing on your bladder.  You may develop or continue to have heartburn as a result of your pregnancy.  You may develop constipation because certain hormones are causing the muscles that push waste through your intestines to slow down.  You may develop hemorrhoids or swollen, bulging veins (varicose veins).  You may have back pain because of the weight gain and pregnancy hormones relaxing your joints between the bones in your pelvis and as a result of a shift in weight and the muscles that support your balance.  Your breasts will continue to grow and be tender.  Your gums may bleed and may be sensitive to brushing and flossing.  Dark spots or blotches (chloasma, mask of pregnancy) may develop on your face. This will likely fade after the baby is born.  A dark line from your belly button to the pubic area (linea nigra) may appear. This will likely fade after the baby is born.  You may have changes in your hair. These can include thickening of your hair, rapid growth, and changes in texture. Some women also have hair loss during or after pregnancy, or hair that feels dry or thin. Your hair will most likely return to normal after your baby is born. WHAT TO EXPECT AT YOUR PRENATAL VISITS During a routine prenatal visit:  You will be weighed to make sure you and the fetus are growing normally.  Your blood pressure will be taken.  Your abdomen will be measured to track your baby's growth.  The fetal heartbeat will be listened to.  Any test results from the previous visit will be discussed. Your health care provider may ask you:  How you are feeling.  If you are feeling the baby move.  If you have had any abnormal symptoms, such as leaking fluid, bleeding, severe headaches, or abdominal cramping.  If you have any questions. Other tests that may be performed during  your second trimester include:  Blood tests that check for:  Low iron levels (anemia).  Gestational diabetes (between 24 and 28 weeks).  Rh antibodies.  Urine tests to check for infections, diabetes, or protein in the urine.  An ultrasound to confirm the proper growth and  development of the baby.  An amniocentesis to check for possible genetic problems.  Fetal screens for spina bifida and Down syndrome. HOME CARE INSTRUCTIONS   Avoid all smoking, herbs, alcohol, and unprescribed drugs. These chemicals affect the formation and growth of the baby.  Follow your health care provider's instructions regarding medicine use. There are medicines that are either safe or unsafe to take during pregnancy.  Exercise only as directed by your health care provider. Experiencing uterine cramps is a good sign to stop exercising.  Continue to eat regular, healthy meals.  Wear a good support bra for breast tenderness.  Do not use hot tubs, steam rooms, or saunas.  Wear your seat belt at all times when driving.  Avoid raw meat, uncooked cheese, cat litter boxes, and soil used by cats. These carry germs that can cause birth defects in the baby.  Take your prenatal vitamins.  Try taking a stool softener (if your health care provider approves) if you develop constipation. Eat more high-fiber foods, such as fresh vegetables or fruit and whole grains. Drink plenty of fluids to keep your urine clear or pale yellow.  Take warm sitz baths to soothe any pain or discomfort caused by hemorrhoids. Use hemorrhoid cream if your health care provider approves.  If you develop varicose veins, wear support hose. Elevate your feet for 15 minutes, 3-4 times a day. Limit salt in your diet.  Avoid heavy lifting, wear low heel shoes, and practice good posture.  Rest with your legs elevated if you have leg cramps or low back pain.  Visit your dentist if you have not gone yet during your pregnancy. Use a soft  toothbrush to brush your teeth and be gentle when you floss.  A sexual relationship may be continued unless your health care provider directs you otherwise.  Continue to go to all your prenatal visits as directed by your health care provider. SEEK MEDICAL CARE IF:   You have dizziness.  You have mild pelvic cramps, pelvic pressure, or nagging pain in the abdominal area.  You have persistent nausea, vomiting, or diarrhea.  You have a bad smelling vaginal discharge.  You have pain with urination. SEEK IMMEDIATE MEDICAL CARE IF:   You have a fever.  You are leaking fluid from your vagina.  You have spotting or bleeding from your vagina.  You have severe abdominal cramping or pain.  You have rapid weight gain or loss.  You have shortness of breath with chest pain.  You notice sudden or extreme swelling of your face, hands, ankles, feet, or legs.  You have not felt your baby move in over an hour.  You have severe headaches that do not go away with medicine.  You have vision changes. Document Released: 03/17/2001 Document Revised: 03/28/2013 Document Reviewed: 05/24/2012 [facility] Patient Information 2015 [place], [place]. This information is not intended to replace advice given to you by your health care provider. Make sure you discuss any questions you have with your health care provider.

## 2014-09-13 NOTE — Progress Notes (Signed)
Subjective:    Bethany Gallagher is a 24 y.o. female being seen today for her obstetrical visit. She is at [redacted]w[redacted]d gestation. Patient reports no bleeding, no leaking, occasional contractions and no contractions today. Fetal movement: normal.  Problem List Items Addressed This Visit    None    Visit Diagnoses    Encounter for supervision of other normal pregnancy in third trimester    -  Primary    Relevant Orders    POCT urinalysis dipstick    Glucose Tolerance, 2 Hours w/1 Hour    CBC    HIV antibody    RPR      Patient Active Problem List   Diagnosis Date Noted  . Trichimoniasis 06/29/2014  . Insufficient prenatal care in second trimester 06/29/2014  . Normal delivery 12/31/2011  . Intrauterine growth restriction affecting antepartum care of mother 12/30/2011  . PIH (pregnancy induced hypertension) 12/30/2011   Objective:    BP 109/69 mmHg  Pulse 104  Temp(Src) 97.8 F (36.6 C)  Wt 156 lb (70.761 kg)  LMP 02/28/2014 (Exact Date) FHT:  143 BPM  Uterine Size: 28 cm and size equals dates  Presentation: cephalic   Cervix: long, thick, closed, posterior.    Assessment:    Pregnancy @ [redacted]w[redacted]d weeks   Doing well  Plan:    Sure swab today for [facility]    labs reviewed, problem list updated Consent signed. TDAP offered  Rhogam given for RH negative Pediatrician: discussed. Infant feeding: plans to breastfeed. Maternity leave: N/A, not employed. Cigarette smoking: never smoked.  Orders Placed This Encounter  Procedures  . Glucose Tolerance, 2 Hours w/1 Hour  . CBC  . HIV antibody  . RPR  . POCT urinalysis dipstick   No orders of the defined types were placed in this encounter.   Follow up in 2 Weeks.

## 2014-09-14 LAB — GLUCOSE TOLERANCE, 2 HOURS W/ 1HR
Glucose, 1 hour: 116 mg/dL (ref 70–170)
Glucose, 2 hour: 104 mg/dL (ref 70–139)
Glucose, Fasting: 91 mg/dL (ref 70–99)

## 2014-09-14 LAB — RPR

## 2014-09-14 LAB — HIV ANTIBODY (ROUTINE TESTING W REFLEX): HIV 1&2 Ab, 4th Generation: NONREACTIVE

## 2014-09-17 LAB — SURESWAB, VAGINOSIS/VAGINITIS PLUS
Atopobium vaginae: NOT DETECTED Log (cells/mL)
C. albicans, DNA: DETECTED — AB
C. glabrata, DNA: NOT DETECTED
C. parapsilosis, DNA: NOT DETECTED
C. trachomatis RNA, TMA: NOT DETECTED
C. tropicalis, DNA: NOT DETECTED
Gardnerella vaginalis: NOT DETECTED Log (cells/mL)
LACTOBACILLUS SPECIES: 6.9 Log (cells/mL)
MEGASPHAERA SPECIES: NOT DETECTED Log (cells/mL)
N. gonorrhoeae RNA, TMA: NOT DETECTED
T. vaginalis RNA, QL TMA: DETECTED — AB

## 2014-09-18 ENCOUNTER — Other Ambulatory Visit: Payer: Self-pay | Admitting: Certified Nurse Midwife

## 2014-09-18 DIAGNOSIS — B373 Candidiasis of vulva and vagina: Secondary | ICD-10-CM

## 2014-09-18 DIAGNOSIS — O23593 Infection of other part of genital tract in pregnancy, third trimester: Principal | ICD-10-CM

## 2014-09-18 DIAGNOSIS — B3731 Acute candidiasis of vulva and vagina: Secondary | ICD-10-CM

## 2014-09-18 DIAGNOSIS — A5901 Trichomonal vulvovaginitis: Secondary | ICD-10-CM

## 2014-09-18 MED ORDER — TINIDAZOLE 500 MG PO TABS: 2.0000 g | ORAL_TABLET | Freq: Every day | ORAL | Status: AC

## 2014-09-18 MED ORDER — FLUCONAZOLE 100 MG PO TABS: 100.0000 mg | ORAL_TABLET | Freq: Once | ORAL | Status: DC

## 2014-09-27 ENCOUNTER — Encounter: Payer: Self-pay | Admitting: Certified Nurse Midwife

## 2014-09-27 ENCOUNTER — Ambulatory Visit (INDEPENDENT_AMBULATORY_CARE_PROVIDER_SITE_OTHER): Payer: Medicaid Other | Admitting: Certified Nurse Midwife

## 2014-09-27 VITALS — BP 112/59 | HR 94 | Temp 98.8°F | Wt 155.2 lb

## 2014-09-27 DIAGNOSIS — O219 Vomiting of pregnancy, unspecified: Secondary | ICD-10-CM

## 2014-09-27 DIAGNOSIS — N760 Acute vaginitis: Secondary | ICD-10-CM

## 2014-09-27 DIAGNOSIS — A599 Trichomoniasis, unspecified: Secondary | ICD-10-CM

## 2014-09-27 DIAGNOSIS — Z3482 Encounter for supervision of other normal pregnancy, second trimester: Secondary | ICD-10-CM

## 2014-09-27 LAB — POCT URINALYSIS DIPSTICK
Bilirubin, UA: NEGATIVE
Blood, UA: NEGATIVE
Ketones, UA: NEGATIVE
Nitrite, UA: NEGATIVE
Spec Grav, UA: 1.015
Urobilinogen, UA: 1
pH, UA: 7

## 2014-09-27 MED ORDER — FLUCONAZOLE 100 MG PO TABS: 100.0000 mg | ORAL_TABLET | Freq: Once | ORAL | Status: DC

## 2014-09-27 MED ORDER — TERCONAZOLE 0.4 % VA CREA: 1.0000 | TOPICAL_CREAM | Freq: Every day | VAGINAL | Status: DC

## 2014-09-27 MED ORDER — TINIDAZOLE 500 MG PO TABS: 2.0000 g | ORAL_TABLET | Freq: Every day | ORAL | Status: AC

## 2014-09-27 MED ORDER — ONDANSETRON HCL 4 MG PO TABS: 4.0000 mg | ORAL_TABLET | Freq: Every day | ORAL | Status: DC | PRN

## 2014-09-27 NOTE — Progress Notes (Signed)
Subjective:    Bethany Gallagher is a 24 y.o. female being seen today for her obstetrical visit. She is at [redacted]w[redacted]d gestation. Patient reports heartburn, nausea, no bleeding, no contractions, no cramping, no leaking and vaginal irritation. Fetal movement: normal.  Problem List Items Addressed This Visit    None    Visit Diagnoses    Supervision of other normal pregnancy, antepartum, second trimester    -  Primary    Relevant Orders    POCT urinalysis dipstick (Completed)    Trichomonas vaginalis infection        Relevant Medications    tinidazole (TINDAMAX) 500 MG tablet    fluconazole (DIFLUCAN) 100 MG tablet    Vulvovaginitis        Relevant Medications    terconazole (TERAZOL 7) 0.4 % vaginal cream    fluconazole (DIFLUCAN) 100 MG tablet    Nausea and vomiting in pregnancy        Relevant Medications    ondansetron (ZOFRAN) 4 MG tablet      Patient Active Problem List   Diagnosis Date Noted  . Trichimoniasis 06/29/2014  . Insufficient prenatal care in second trimester 06/29/2014  . Normal delivery 12/31/2011  . Intrauterine growth restriction affecting antepartum care of mother 12/30/2011  . PIH (pregnancy induced hypertension) 12/30/2011   Objective:    BP 112/59 mmHg  Pulse 94  Temp(Src) 98.8 F (37.1 C)  Wt 155 lb 3.2 oz (70.398 kg)  LMP 02/28/2014 (Exact Date) FHT:  140's BPM  Uterine Size: size equals dates  Presentation: unsure     Assessment:  Vulvovaginitis N&V Recent Trich infection  Pregnancy @ [redacted]w[redacted]d weeks   Plan:  Patient educated on completing her treatment for Trichomoniasis and yeast.  Patient verbalized understanding that her partner would need to be treated along with completing her treatment.   Is working, states that it is not hard for her sits most of the time.     labs reviewed, problem list updated Consent signed. TDAP offered  Rhogam given for RH negative Pediatrician: discussed. Infant feeding: plans to breastfeed. Maternity leave:  discussed. Cigarette smoking: quit at start of pregnancy. Orders Placed This Encounter  Procedures  . POCT urinalysis dipstick   Meds ordered this encounter  Medications  . tinidazole (TINDAMAX) 500 MG tablet    Sig: Take 4 tablets (2,000 mg total) by mouth daily with breakfast.    Dispense:  12 tablet    Refill:  0  . terconazole (TERAZOL 7) 0.4 % vaginal cream    Sig: Place 1 applicator vaginally at bedtime.    Dispense:  45 g    Refill:  0  . fluconazole (DIFLUCAN) 100 MG tablet    Sig: Take 1 tablet (100 mg total) by mouth once. Repeat dose in 48-72 hour.    Dispense:  3 tablet    Refill:  0  . ondansetron (ZOFRAN) 4 MG tablet    Sig: Take 1 tablet (4 mg total) by mouth daily as needed for nausea or vomiting.    Dispense:  30 tablet    Refill:  1   Follow up in 2 Weeks.

## 2014-10-03 ENCOUNTER — Other Ambulatory Visit: Payer: Self-pay | Admitting: Certified Nurse Midwife

## 2014-10-11 ENCOUNTER — Telehealth: Payer: Self-pay | Admitting: Obstetrics

## 2014-10-11 ENCOUNTER — Encounter: Payer: Medicaid Other | Admitting: Certified Nurse Midwife

## 2014-10-11 NOTE — Telephone Encounter (Signed)
10/11/2014 - Called patient to advise of missed ROB appt. Mother stated she had an appt. Elsewhere this morning. Rescheduled patient for 10/15/2014 @ 9:45am with Dr. Clearance CootsHarper. brm

## 2014-10-15 ENCOUNTER — Ambulatory Visit (INDEPENDENT_AMBULATORY_CARE_PROVIDER_SITE_OTHER): Payer: Medicaid Other | Admitting: Obstetrics

## 2014-10-15 VITALS — BP 103/67 | HR 86 | Temp 98.5°F | Wt 156.0 lb

## 2014-10-15 DIAGNOSIS — A5901 Trichomonal vulvovaginitis: Secondary | ICD-10-CM

## 2014-10-15 DIAGNOSIS — Z3483 Encounter for supervision of other normal pregnancy, third trimester: Secondary | ICD-10-CM

## 2014-10-15 LAB — POCT URINALYSIS DIPSTICK
Bilirubin, UA: NEGATIVE
Blood, UA: NEGATIVE
Glucose, UA: NEGATIVE
Ketones, UA: NEGATIVE
Leukocytes, UA: NEGATIVE
Nitrite, UA: NEGATIVE
Protein, UA: NEGATIVE
Spec Grav, UA: 1.015
Urobilinogen, UA: NEGATIVE
pH, UA: 7

## 2014-10-15 MED ORDER — TINIDAZOLE 500 MG PO TABS: 2.0000 g | ORAL_TABLET | Freq: Every day | ORAL | Status: DC

## 2014-10-16 ENCOUNTER — Encounter: Payer: Self-pay | Admitting: Obstetrics

## 2014-10-16 NOTE — Progress Notes (Signed)
Subjective:    Bethany Gallagher is a 24 y.o. female being seen today for her obstetrical visit. She is at 7335w6d gestation. Patient reports no complaints. Fetal movement: normal.  Problem List Items Addressed This Visit    None    Visit Diagnoses    Encounter for supervision of other normal pregnancy in third trimester    -  Primary    Relevant Orders    SureSwab, Vaginosis/Vaginitis Plus    POCT urinalysis dipstick (Completed)    Trichomonas vaginitis        Relevant Medications    tinidazole (TINDAMAX) 500 MG tablet    Other Relevant Orders    SureSwab, Vaginosis/Vaginitis Plus      Patient Active Problem List   Diagnosis Date Noted  . Trichimoniasis 06/29/2014  . Insufficient prenatal care in second trimester 06/29/2014  . Normal delivery 12/31/2011  . Intrauterine growth restriction affecting antepartum care of mother 12/30/2011  . PIH (pregnancy induced hypertension) 12/30/2011   Objective:    BP 103/67 mmHg  Pulse 86  Temp(Src) 98.5 F (36.9 C)  Wt 156 lb (70.761 kg)  LMP 02/28/2014 (Exact Date) FHT:  150 BPM  Uterine Size: size equals dates  Presentation: unsure     Assessment:    Pregnancy @ 5935w6d weeks   Plan:     labs reviewed, problem list updated Consent signed. GBS sent TDAP offered  Rhogam given for RH negative Pediatrician: discussed. Infant feeding: plans to breastfeed. Maternity leave: discussed. Cigarette smoking: never smoked. Orders Placed This Encounter  Procedures  . SureSwab, Vaginosis/Vaginitis Plus  . POCT urinalysis dipstick   Meds ordered this encounter  Medications  . tinidazole (TINDAMAX) 500 MG tablet    Sig: Take 4 tablets (2,000 mg total) by mouth daily with breakfast.    Dispense:  4 tablet    Refill:  1   Follow up in 2 Week.

## 2014-10-18 LAB — SURESWAB, VAGINOSIS/VAGINITIS PLUS
Atopobium vaginae: NOT DETECTED Log (cells/mL)
C. albicans, DNA: DETECTED — AB
C. glabrata, DNA: NOT DETECTED
C. parapsilosis, DNA: NOT DETECTED
C. trachomatis RNA, TMA: NOT DETECTED
C. tropicalis, DNA: NOT DETECTED
Gardnerella vaginalis: <4.7 Log (cells/mL)
LACTOBACILLUS SPECIES: 7.9 Log (cells/mL)
MEGASPHAERA SPECIES: NOT DETECTED Log (cells/mL)
N. gonorrhoeae RNA, TMA: NOT DETECTED
T. vaginalis RNA, QL TMA: DETECTED — AB

## 2014-10-19 ENCOUNTER — Other Ambulatory Visit: Payer: Self-pay | Admitting: Obstetrics

## 2014-10-25 ENCOUNTER — Inpatient Hospital Stay (HOSPITAL_COMMUNITY)
Admission: AD | Admit: 2014-10-25 | Discharge: 2014-10-25 | Disposition: A | Discharge status: Home / Self Care | Payer: Medicaid Other | Source: Ambulatory Visit | Attending: Obstetrics | Admitting: Obstetrics

## 2014-10-25 ENCOUNTER — Encounter (HOSPITAL_COMMUNITY): Payer: Self-pay | Admitting: *Deleted

## 2014-10-25 DIAGNOSIS — R109 Unspecified abdominal pain: Secondary | ICD-10-CM | POA: Diagnosis not present

## 2014-10-25 DIAGNOSIS — Z3A34 34 weeks gestation of pregnancy: Secondary | ICD-10-CM | POA: Insufficient documentation

## 2014-10-25 DIAGNOSIS — O4703 False labor before 37 completed weeks of gestation, third trimester: Secondary | ICD-10-CM

## 2014-10-25 DIAGNOSIS — O4700 False labor before 37 completed weeks of gestation, unspecified trimester: Secondary | ICD-10-CM | POA: Insufficient documentation

## 2014-10-25 LAB — URINALYSIS, DIPSTICK ONLY
Bilirubin Urine: NEGATIVE
Glucose, UA: NEGATIVE mg/dL
Hgb urine dipstick: NEGATIVE
Ketones, ur: NEGATIVE mg/dL
Nitrite: NEGATIVE
Protein, ur: NEGATIVE mg/dL
Specific Gravity, Urine: 1.01 (ref 1.005–1.030)
Urobilinogen, UA: 0.2 mg/dL (ref 0.0–1.0)
pH: 6.5 (ref 5.0–8.0)

## 2014-10-25 NOTE — Discharge Instructions (Signed)

## 2014-10-25 NOTE — MAU Provider Note (Signed)
Chief Complaint:  Contractions   First Provider Initiated Contact with Patient 10/25/14 2225      HPI: Bethany Gallagher is a 24 y.o. G3P2002 at 82w1dwho presents to maternity admissions reporting irregular contractions ~3-4x/hour described as cramping pain mostly in her lower abdomen but also accompanied by intermittent sharp pains in her upper abdomen.  She reports the pain is not associated with food/meals.  She reports good fetal movement, denies LOF, vaginal bleeding, vaginal itching/burning, urinary symptoms, h/a, dizziness, n/v, or fever/chills.    Abdominal Pain This is a new problem. The current episode started today. The onset quality is gradual. The problem occurs intermittently. The most recent episode lasted 1 day. The problem has been resolved. The pain is located in the LLQ, RLQ, RUQ and LUQ. The pain is moderate. The quality of the pain is cramping and sharp. The abdominal pain does not radiate. Associated symptoms include nausea. Pertinent negatives include no constipation, diarrhea, dysuria, fever, frequency, headaches or vomiting. She has tried nothing for the symptoms.    Past Medical History: Past Medical History  Diagnosis Date  . No pertinent past medical history   . Hx of gonorrhea   . Hx of chlamydia infection   . Pregnancy induced hypertension     Past obstetric history: OB History  Gravida Para Term Preterm AB SAB TAB Ectopic Multiple Living  0 0 0 0 0 0 2    # Outcome Date GA Lbr Len/2nd Weight Sex Delivery Anes PTL Lv  3 Current           2 Term 12/31/11 [redacted]w[redacted]d 03:12 / 00:10 2.075 kg (4 lb 9.2 oz) F Vag-Spont EPI  Y  1 Term 2012 [redacted]w[redacted]d 48:00 2.438 kg (5 lb 6 oz) F Vag-Spont EPI  Y      Past Surgical History: Past Surgical History  Procedure Laterality Date  . No past surgeries      Family History: Family History  Problem Relation Age of Onset  . Alcohol abuse Neg Hx     Social History: History  Substance Use Topics  . Smoking status:  Never Smoker   . Smokeless tobacco: Not on file  . Alcohol Use: No    Allergies: No Known Allergies  Meds:  Prescriptions prior to admission  Medication Sig Dispense Refill Last Dose  . Prenat-FeCbn-FeAspGl-FA-Omega (OB COMPLETE PETITE) 35-5-1-200 MG CAPS Take 1 capsule by mouth daily. 30 capsule 11 10/25/2014 at Unknown time  . fluconazole (DIFLUCAN) 100 MG tablet Take 1 tablet (100 mg total) by mouth once. Repeat dose in 48-72 hour. (Patient not taking: Reported on 10/15/2014) 3 tablet 0 Not Taking  . ondansetron (ZOFRAN) 4 MG tablet Take 1 tablet (4 mg total) by mouth daily as needed for nausea or vomiting. (Patient not taking: Reported on 10/15/2014) 30 tablet 1 Not Taking at Unknown time  . terconazole (TERAZOL 7) 0.4 % vaginal cream Place 1 applicator vaginally at bedtime. (Patient not taking: Reported on 10/15/2014) 45 g 0 Not Taking at Unknown time  . tinidazole (TINDAMAX) 500 MG tablet Take 4 tablets (2,000 mg total) by mouth daily with breakfast. 4 tablet 1 10/22/2014    Review of Systems  Constitutional: Negative for fever, chills and malaise/fatigue.  Eyes: Negative for blurred vision.  Respiratory: Negative for cough and shortness of breath.   Cardiovascular: Negative for chest pain.  Gastrointestinal: Positive for nausea and abdominal pain. Negative for heartburn, vomiting, diarrhea and constipation.  Genitourinary: Negative for dysuria, urgency and  frequency.  Musculoskeletal: Negative.   Neurological: Negative for dizziness and headaches.  Psychiatric/Behavioral: Negative for depression.    Physical Exam  Blood pressure 106/65, pulse 94, temperature 97.6 F (36.4 C), temperature source Oral, resp. rate 18, height 5\' 5"  (1.651 m), weight 71.759 kg (158 lb 3.2 oz), last menstrual period 02/28/2014, unknown if currently breastfeeding. GENERAL: Well-developed, well-nourished female in no acute distress.  EYES: normal sclera/conjunctiva; no lid-lag HENT: Atraumatic,  normocephalic HEART: normal rate RESP: normal effort ABDOMEN: Soft, non-tender MUSCULOSKELETAL: Normal ROM EXTREMITIES: Nontender, no edema NEURO/PSYCH: Alert and oriented, appropriate affect  GU: Cervix: Dilation: 1 Effacement (%): Thick Cervical Position: Posterior Station: -3 Presentation: Vertex Exam by:: L Kirby-Leftwich CNM  FHT:  Baseline 135 , moderate variability, accelerations present, no decelerations Contractions: None on toco or to palpation   Labs: Results for orders placed or performed during the hospital encounter of 10/25/14 (from the past 24 hour(s))  Urinalysis, dipstick only     Status: Abnormal   Collection Time: 10/25/14  9:10 PM  Result Value Ref Range   Specific Gravity, Urine 1.010 1.005 - 1.030   pH 6.5 5.0 - 8.0   Glucose, UA NEGATIVE NEGATIVE mg/dL   Hgb urine dipstick NEGATIVE NEGATIVE   Bilirubin Urine NEGATIVE NEGATIVE   Ketones, ur NEGATIVE NEGATIVE mg/dL   Protein, ur NEGATIVE NEGATIVE mg/dL   Urobilinogen, UA 0.2 0.0 - 1.0 mg/dL   Nitrite NEGATIVE NEGATIVE   Leukocytes, UA MODERATE (A) NEGATIVE    ED Course Pt reports lower abdominal cramping and upper abdominal sharp pain resolved after arriving in MAU.  Cervical exam done, and fetal monitoring reviewed, revealing no signs of PTL or evidence of abnormal fetal heart rate.  Assessment: 1. Threatened preterm labor, third trimester     Plan: Discharge home  PTL precautions and fetal kick counts Urine sent for culture      Follow-up Information    Follow up with HARPER,CHARLES A, MD.   Specialty:  Obstetrics and Gynecology   Why:  As scheduled   Contact information:   [address] [place] [address] [phone]       Follow up with [facility].   Why:  As needed for emergencies   Contact information:   [address] [id number] mc [place] [address] [phone]       Medication  List    STOP taking these medications        fluconazole 100 MG tablet  Commonly known as:  DIFLUCAN     ondansetron 4 MG tablet  Commonly known as:  ZOFRAN     terconazole 0.4 % vaginal cream  Commonly known as:  TERAZOL 7      TAKE these medications        OB COMPLETE PETITE 35-5-1-200 MG Caps  Take 1 capsule by mouth daily.     tinidazole 500 MG tablet  Commonly known as:  TINDAMAX  Take 4 tablets (2,000 mg total) by mouth daily with breakfast.        Sharen Counter Certified Nurse-Midwife 10/25/2014 10:35 PM

## 2014-10-25 NOTE — MAU Note (Signed)
Over the past hour pt has felt tightening pressure at upper abd and sharp pains in lower abd; states it is worse when she sits.  Denies any bleeding or leaking, reports good fetal movement.

## 2014-10-28 LAB — CULTURE, OB URINE

## 2014-10-29 ENCOUNTER — Ambulatory Visit (INDEPENDENT_AMBULATORY_CARE_PROVIDER_SITE_OTHER): Payer: Medicaid Other | Admitting: Obstetrics

## 2014-10-29 ENCOUNTER — Encounter: Payer: Self-pay | Admitting: Advanced Practice Midwife

## 2014-10-29 ENCOUNTER — Telehealth: Payer: Self-pay | Admitting: Advanced Practice Midwife

## 2014-10-29 ENCOUNTER — Encounter: Payer: Self-pay | Admitting: Obstetrics

## 2014-10-29 VITALS — BP 105/61 | HR 97 | Temp 98.3°F | Wt 156.0 lb

## 2014-10-29 DIAGNOSIS — B951 Streptococcus, group B, as the cause of diseases classified elsewhere: Secondary | ICD-10-CM | POA: Insufficient documentation

## 2014-10-29 DIAGNOSIS — O2340 Unspecified infection of urinary tract in pregnancy, unspecified trimester: Secondary | ICD-10-CM

## 2014-10-29 DIAGNOSIS — Z3483 Encounter for supervision of other normal pregnancy, third trimester: Secondary | ICD-10-CM

## 2014-10-29 MED ORDER — AMOXICILLIN 500 MG PO CAPS: 500.0000 mg | ORAL_CAPSULE | Freq: Three times a day (TID) | ORAL | Status: DC

## 2014-10-29 NOTE — Progress Notes (Signed)
Subjective:    Bethany Gallagher is a 24 y.o. female being seen today for her obstetrical visit. She is at [redacted]w[redacted]d gestation. Patient reports no complaints. Fetal movement: normal.  Problem List Items Addressed This Visit    None     Patient Active Problem List   Diagnosis Date Noted  . GBS (group B streptococcus) UTI complicating pregnancy 10/29/2014  . Trichimoniasis 06/29/2014  . Insufficient prenatal care in second trimester 06/29/2014  . Normal delivery 12/31/2011  . Intrauterine growth restriction affecting antepartum care of mother 12/30/2011  . PIH (pregnancy induced hypertension) 12/30/2011   Objective:    BP 105/61 mmHg  Pulse 97  Temp(Src) 98.3 F (36.8 C)  Wt 156 lb (70.761 kg)  LMP 02/28/2014 (Exact Date) FHT:  150 BPM  Uterine Size: size equals dates  Presentation: unsure     Assessment:    Pregnancy @ [redacted]w[redacted]d weeks   Plan:     labs reviewed, problem list updated Consent signed. GBS sent TDAP offered  Rhogam given for RH negative Pediatrician: discussed. Infant feeding: plans to breastfeed. Maternity leave: discussed. Cigarette smoking: never smoked. No orders of the defined types were placed in this encounter.   No orders of the defined types were placed in this encounter.   Follow up in 1 Week.

## 2014-10-29 NOTE — Telephone Encounter (Signed)
Pt urine culture positive for GBS UTI.  Amoxicillin 500 mg TID sent to pharmacy.

## 2014-11-12 ENCOUNTER — Encounter: Payer: Medicaid Other | Admitting: Obstetrics

## 2014-11-13 ENCOUNTER — Encounter: Payer: Self-pay | Admitting: Obstetrics

## 2014-11-13 ENCOUNTER — Encounter: Payer: Medicaid Other | Admitting: Obstetrics

## 2014-11-20 ENCOUNTER — Encounter: Payer: Medicaid Other | Admitting: Obstetrics

## 2014-11-21 ENCOUNTER — Encounter: Payer: Medicaid Other | Admitting: Obstetrics

## 2014-11-23 ENCOUNTER — Inpatient Hospital Stay (HOSPITAL_COMMUNITY): Payer: Medicaid Other | Admitting: Anesthesiology

## 2014-11-23 ENCOUNTER — Encounter (HOSPITAL_COMMUNITY): Payer: Self-pay | Admitting: *Deleted

## 2014-11-23 ENCOUNTER — Inpatient Hospital Stay (HOSPITAL_COMMUNITY)
Admission: AD | Admit: 2014-11-23 | Discharge: 2014-11-25 | DRG: 775 | Disposition: A | Discharge status: Home / Self Care | Payer: Medicaid Other | Source: Ambulatory Visit | Attending: Obstetrics | Admitting: Obstetrics

## 2014-11-23 DIAGNOSIS — O99824 Streptococcus B carrier state complicating childbirth: Secondary | ICD-10-CM | POA: Diagnosis present

## 2014-11-23 DIAGNOSIS — B951 Streptococcus, group B, as the cause of diseases classified elsewhere: Secondary | ICD-10-CM

## 2014-11-23 DIAGNOSIS — Z3A38 38 weeks gestation of pregnancy: Secondary | ICD-10-CM | POA: Diagnosis present

## 2014-11-23 DIAGNOSIS — O2343 Unspecified infection of urinary tract in pregnancy, third trimester: Secondary | ICD-10-CM

## 2014-11-23 DIAGNOSIS — IMO0001 Reserved for inherently not codable concepts without codable children: Secondary | ICD-10-CM

## 2014-11-23 LAB — CBC
HCT: 30.2 % — ABNORMAL LOW (ref 36.0–46.0)
Hemoglobin: 9.9 g/dL — ABNORMAL LOW (ref 12.0–15.0)
MCH: 27.2 pg (ref 26.0–34.0)
MCHC: 32.8 g/dL (ref 30.0–36.0)
MCV: 83 fL (ref 78.0–100.0)
Platelets: 249 10*3/uL (ref 150–400)
RBC: 3.64 MIL/uL — ABNORMAL LOW (ref 3.87–5.11)
RDW: 14.3 % (ref 11.5–15.5)
WBC: 6.3 10*3/uL (ref 4.0–10.5)

## 2014-11-23 LAB — TYPE AND SCREEN
ABO/RH(D): O POS
Antibody Screen: NEGATIVE

## 2014-11-23 MED ORDER — CITRIC ACID-SODIUM CITRATE 334-500 MG/5ML PO SOLN
30.0000 mL | ORAL | Status: DC | PRN
Start: 2014-11-23 — End: 2014-11-23

## 2014-11-23 MED ORDER — WITCH HAZEL-GLYCERIN EX PADS: 1.0000 "application " | MEDICATED_PAD | CUTANEOUS | Status: DC | PRN

## 2014-11-23 MED ORDER — PENICILLIN G POTASSIUM 5000000 UNITS IJ SOLR
5.0000 10*6.[IU] | Freq: Once | INTRAVENOUS | Status: AC
  Administered 2014-11-23: 5 10*6.[IU] via INTRAVENOUS
  Filled 2014-11-23: qty 5

## 2014-11-23 MED ORDER — SENNOSIDES-DOCUSATE SODIUM 8.6-50 MG PO TABS
2.0000 | ORAL_TABLET | ORAL | Status: DC
  Administered 2014-11-23 – 2014-11-25 (×2): 2 via ORAL
  Filled 2014-11-23 (×2): qty 2

## 2014-11-23 MED ORDER — SIMETHICONE 80 MG PO CHEW: 80.0000 mg | CHEWABLE_TABLET | ORAL | Status: DC | PRN

## 2014-11-23 MED ORDER — BENZOCAINE-MENTHOL 20-0.5 % EX AERO
1.0000 "application " | INHALATION_SPRAY | CUTANEOUS | Status: DC | PRN
  Administered 2014-11-23: 1 via TOPICAL
  Filled 2014-11-23: qty 56

## 2014-11-23 MED ORDER — OXYTOCIN BOLUS FROM INFUSION
500.0000 mL | INTRAVENOUS | Status: DC
  Administered 2014-11-23: 500 mL via INTRAVENOUS

## 2014-11-23 MED ORDER — FENTANYL 2.5 MCG/ML BUPIVACAINE 1/10 % EPIDURAL INFUSION (WH - ANES)
14.0000 mL/h | INTRAMUSCULAR | Status: DC | PRN
  Administered 2014-11-23: 14 mL/h via EPIDURAL
  Filled 2014-11-23: qty 125

## 2014-11-23 MED ORDER — ONDANSETRON HCL 4 MG/2ML IJ SOLN: 4.0000 mg | Freq: Four times a day (QID) | INTRAMUSCULAR | Status: DC | PRN

## 2014-11-23 MED ORDER — OXYTOCIN 40 UNITS IN LACTATED RINGERS INFUSION - SIMPLE MED
62.5000 mL/h | INTRAVENOUS | Status: DC
  Filled 2014-11-23: qty 1000

## 2014-11-23 MED ORDER — FLEET ENEMA 7-19 GM/118ML RE ENEM: 1.0000 | ENEMA | RECTAL | Status: DC | PRN

## 2014-11-23 MED ORDER — IBUPROFEN 600 MG PO TABS
600.0000 mg | ORAL_TABLET | Freq: Four times a day (QID) | ORAL | Status: DC
  Administered 2014-11-23 – 2014-11-25 (×7): 600 mg via ORAL
  Filled 2014-11-23 (×7): qty 1

## 2014-11-23 MED ORDER — DIBUCAINE 1 % RE OINT: 1.0000 "application " | TOPICAL_OINTMENT | RECTAL | Status: DC | PRN

## 2014-11-23 MED ORDER — LACTATED RINGERS IV SOLN
INTRAVENOUS | Status: DC
  Administered 2014-11-23 (×3): via INTRAVENOUS

## 2014-11-23 MED ORDER — ONDANSETRON HCL 4 MG/2ML IJ SOLN: 4.0000 mg | INTRAMUSCULAR | Status: DC | PRN

## 2014-11-23 MED ORDER — TETANUS-DIPHTH-ACELL PERTUSSIS 5-2.5-18.5 LF-MCG/0.5 IM SUSP: 0.5000 mL | Freq: Once | INTRAMUSCULAR | Status: DC

## 2014-11-23 MED ORDER — OXYCODONE-ACETAMINOPHEN 5-325 MG PO TABS: 2.0000 | ORAL_TABLET | ORAL | Status: DC | PRN

## 2014-11-23 MED ORDER — FENTANYL 2.5 MCG/ML BUPIVACAINE 1/10 % EPIDURAL INFUSION (WH - ANES): 14.0000 mL/h | INTRAMUSCULAR | Status: DC | PRN

## 2014-11-23 MED ORDER — PRENATAL MULTIVITAMIN CH
1.0000 | ORAL_TABLET | Freq: Every day | ORAL | Status: DC
  Administered 2014-11-24: 1 via ORAL
  Filled 2014-11-23 (×2): qty 1

## 2014-11-23 MED ORDER — LACTATED RINGERS IV SOLN
500.0000 mL | INTRAVENOUS | Status: DC | PRN
  Administered 2014-11-23: 500 mL via INTRAVENOUS

## 2014-11-23 MED ORDER — LIDOCAINE HCL (PF) 1 % IJ SOLN
30.0000 mL | INTRAMUSCULAR | Status: DC | PRN
  Filled 2014-11-23: qty 30

## 2014-11-23 MED ORDER — LIDOCAINE HCL (PF) 1 % IJ SOLN
INTRAMUSCULAR | Status: DC | PRN
  Administered 2014-11-23: 4 mL
  Administered 2014-11-23: 6 mL via EPIDURAL

## 2014-11-23 MED ORDER — METHYLERGONOVINE MALEATE 0.2 MG/ML IJ SOLN: 0.2000 mg | INTRAMUSCULAR | Status: DC | PRN

## 2014-11-23 MED ORDER — ACETAMINOPHEN 325 MG PO TABS: 650.0000 mg | ORAL_TABLET | ORAL | Status: DC | PRN

## 2014-11-23 MED ORDER — DEXTROSE 5 % IV SOLN
2.5000 10*6.[IU] | INTRAVENOUS | Status: DC
  Administered 2014-11-23: 2.5 10*6.[IU] via INTRAVENOUS
  Filled 2014-11-23 (×5): qty 2.5

## 2014-11-23 MED ORDER — OXYCODONE-ACETAMINOPHEN 5-325 MG PO TABS: 1.0000 | ORAL_TABLET | ORAL | Status: DC | PRN

## 2014-11-23 MED ORDER — OXYTOCIN 40 UNITS IN LACTATED RINGERS INFUSION - SIMPLE MED: 62.5000 mL/h | INTRAVENOUS | Status: DC | PRN

## 2014-11-23 MED ORDER — DIPHENHYDRAMINE HCL 50 MG/ML IJ SOLN: 12.5000 mg | INTRAMUSCULAR | Status: DC | PRN

## 2014-11-23 MED ORDER — LANOLIN HYDROUS EX OINT: TOPICAL_OINTMENT | CUTANEOUS | Status: DC | PRN

## 2014-11-23 MED ORDER — METHYLERGONOVINE MALEATE 0.2 MG PO TABS: 0.2000 mg | ORAL_TABLET | ORAL | Status: DC | PRN

## 2014-11-23 MED ORDER — ONDANSETRON HCL 4 MG PO TABS: 4.0000 mg | ORAL_TABLET | ORAL | Status: DC | PRN

## 2014-11-23 MED ORDER — DIPHENHYDRAMINE HCL 25 MG PO CAPS: 25.0000 mg | ORAL_CAPSULE | Freq: Four times a day (QID) | ORAL | Status: DC | PRN

## 2014-11-23 MED ORDER — EPHEDRINE 5 MG/ML INJ
10.0000 mg | INTRAVENOUS | Status: DC | PRN
  Filled 2014-11-23: qty 2

## 2014-11-23 MED ORDER — ZOLPIDEM TARTRATE 5 MG PO TABS: 5.0000 mg | ORAL_TABLET | Freq: Every evening | ORAL | Status: DC | PRN

## 2014-11-23 MED ORDER — PHENYLEPHRINE 40 MCG/ML (10ML) SYRINGE FOR IV PUSH (FOR BLOOD PRESSURE SUPPORT)
80.0000 ug | PREFILLED_SYRINGE | INTRAVENOUS | Status: DC | PRN
  Filled 2014-11-23: qty 2
  Filled 2014-11-23: qty 20

## 2014-11-23 NOTE — Anesthesia Preprocedure Evaluation (Addendum)
Anesthesia Evaluation  Patient identified by MRN, date of birth, ID band Patient awake    Reviewed: Allergy & Precautions, H&P , Patient's Chart, lab work & pertinent test results  Airway Mallampati: II  TM Distance: >3 FB Neck ROM: full    Dental  (+) Teeth Intact   Pulmonary  breath sounds clear to auscultation        Cardiovascular hypertension, Rhythm:regular Rate:Normal     Neuro/Psych    GI/Hepatic   Endo/Other    Renal/GU      Musculoskeletal   Abdominal   Peds  Hematology  (+) anemia ,   Anesthesia Other Findings   Pregnancy induced hypertension- previous pregnancy    Reproductive/Obstetrics (+) Pregnancy                            Anesthesia Physical Anesthesia Plan  ASA: II  Anesthesia Plan: Epidural   Post-op Pain Management:    Induction:   Airway Management Planned:   Additional Equipment:   Intra-op Plan:   Post-operative Plan:   Informed Consent: I have reviewed the patients History and Physical, chart, labs and discussed the procedure including the risks, benefits and alternatives for the proposed anesthesia with the patient or authorized representative who has indicated his/her understanding and acceptance.   Dental Advisory Given  Plan Discussed with:   Anesthesia Plan Comments: (Labs checked- platelets confirmed with RN in room. Fetal heart tracing, per RN, reported to be stable enough for sitting procedure. Discussed epidural, and patient consents to the procedure:  included risk of possible headache,backache, failed block, allergic reaction, and nerve injury. This patient was asked if she had any questions or concerns before the procedure started.)        Anesthesia Quick Evaluation

## 2014-11-23 NOTE — H&P (Signed)
Bethany Gallagher is a 24 y.o. female presenting for SROM and term labor. Maternal Medical History:  Reason for admission: Rupture of membranes and contractions.   Contractions: Onset was 3-5 hours ago.   Frequency: regular.    Fetal activity: Perceived fetal activity is normal.    Prenatal complications: no prenatal complications   OB History    Gravida Para Term Preterm AB TAB SAB Ectopic Multiple Living   0 0 0 0 0 0 2     Past Medical History  Diagnosis Date  . No pertinent past medical history   . Hx of gonorrhea   . Hx of chlamydia infection   . Pregnancy induced hypertension    Past Surgical History  Procedure Laterality Date  . No past surgeries     Family History: family history is negative for Alcohol abuse. Social History:  reports that she has never smoked. She does not have any smokeless tobacco history on file. She reports that she does not drink alcohol or use illicit drugs.   Prenatal Transfer Tool  Maternal Diabetes: No Genetic Screening: Normal Maternal Ultrasounds/Referrals: Normal Fetal Ultrasounds or other Referrals:  None Maternal Substance Abuse:  No Significant Maternal Medications:  None Significant Maternal Lab Results:  Lab values include: Group B Strep positive Other Comments:  None  ROS  Dilation: 3.5 Effacement (%): 70 Station: -2 Exam by:: DCALLAWAY,RN   Blood pressure 133/98, pulse 116, temperature 98.2 F (36.8 C), temperature source Oral, resp. rate 18, height  (1.676 m), weight 154 lb (69.854 kg), last menstrual period 02/28/2014, SpO2 100 %, unknown if currently breastfeeding. Exam Physical Exam  Prenatal labs: ABO, Rh: --/--/O POS (08/19 1610) Antibody: NEG (08/19 9604) Rubella: 1.27 (04/07 1509) RPR: NON REAC (06/09 1004)  HBsAg: NEGATIVE (04/07 1509)  HIV: NONREACTIVE (06/09 1004)  GBS:   Positive in Urine  Assessment/Plan: Term Labor with SROM, patient reported contractions around 0450 this AM with SROM  at home.  Desires Epidural for pain management, declines IVP pain medications.  Anticipate NSVD.     Bethany Gallagher A Bethany Gallagher 11/23/2014, 8:41 AM

## 2014-11-23 NOTE — Anesthesia Procedure Notes (Signed)

## 2014-11-23 NOTE — MAU Note (Signed)
SAYS  AT 0440-   WENT  TO B-ROOM   -  GUSH OF  FLUID -VE   IN OFFFICE   -  1 CM.   DENIES HSV AND MRSA.    GBS-   POSITIVE.

## 2014-11-24 LAB — CBC
HCT: 24.6 % — ABNORMAL LOW (ref 36.0–46.0)
Hemoglobin: 7.9 g/dL — ABNORMAL LOW (ref 12.0–15.0)
MCH: 26.3 pg (ref 26.0–34.0)
MCHC: 32.1 g/dL (ref 30.0–36.0)
MCV: 82 fL (ref 78.0–100.0)
Platelets: 214 10*3/uL (ref 150–400)
RBC: 3 MIL/uL — ABNORMAL LOW (ref 3.87–5.11)
RDW: 14.3 % (ref 11.5–15.5)
WBC: 8.6 10*3/uL (ref 4.0–10.5)

## 2014-11-24 LAB — RPR: RPR Ser Ql: NONREACTIVE

## 2014-11-24 NOTE — Anesthesia Postprocedure Evaluation (Signed)
Anesthesia Post Note  Patient:    Procedure(s) Performed: CLE/C/S  Anesthesia type: Epidural  Patient location: Mother/Baby  Post pain: Pain level controlled  Post assessment: Post-op Vital signs reviewed  Last Vitals: BP 114/74 mmHg  Pulse 81  Temp(Src) 36.9 C (Oral)  Resp 18  Ht  (1.676 m)  Wt 154 lb (69.854 kg)  BMI 24.87 kg/m2  SpO2 100%  LMP 02/28/2014 (Exact Date)  Breastfeeding? Unknown  Post vital signs: Reviewed  Level of consciousness: awake  Complications: No apparent anesthesia complications

## 2014-11-24 NOTE — Progress Notes (Signed)
Patient ID: Bethany Gallagher, female   DOB: [birth year], 24 y.o.   MRN: [id number] Postpartum day one Vital signs normal Fundus firm Lochia moderate Doing well

## 2014-11-24 NOTE — Addendum Note (Signed)
Addendum  created 11/24/14 1431 by Graciela Husbands, CRNA   Modules edited: Charges VN, Notes Section   Notes Section:  File: [id number]

## 2014-11-25 ENCOUNTER — Other Ambulatory Visit: Payer: Self-pay | Admitting: Certified Nurse Midwife

## 2014-11-25 DIAGNOSIS — D500 Iron deficiency anemia secondary to blood loss (chronic): Secondary | ICD-10-CM

## 2014-11-25 MED ORDER — FERIVA 21/7 75-1 MG PO TABS: 1.0000 | ORAL_TABLET | Freq: Every day | ORAL | Status: DC

## 2014-11-25 NOTE — Discharge Instructions (Signed)
Discharge instructions   You can wash your hair  Shower  Eat what you want  Drink what you want  See me in 6 weeks  Your ankles are going to swell more in the next 2 weeks than when pregnant  No sex for 6 weeks   Bethany Gallagher A, MD 11/25/2014

## 2014-11-25 NOTE — Progress Notes (Signed)
Patient ID: Bethany Gallagher, female   DOB: 1990-09-09, 24 y.o.   MRN: [id number] Postpartum day 2 Vital signs normal Fundus firm Lochia moderate Legs negative home today

## 2014-11-25 NOTE — Discharge Summary (Signed)
Obstetric Discharge Summary Reason for Admission: onset of labor Prenatal Procedures: none Intrapartum Procedures: spontaneous vaginal delivery Postpartum Procedures: none Complications-Operative and Postpartum: none HEMOGLOBIN  Date Value Ref Range Status  11/24/2014 7.9* 12.0 - 15.0 g/dL Final    Comment:    DELTA CHECK NOTED REPEATED TO VERIFY    HCT  Date Value Ref Range Status  11/24/2014 24.6* 36.0 - 46.0 % Final    Physical Exam:  General: alert Lochia: appropriate Uterine Fundus: firm Incision: healing well DVT Evaluation: No evidence of DVT seen on physical exam.  Discharge Diagnoses: Term Pregnancy-delivered  Discharge Information: Date: 11/25/2014 Activity: pelvic rest Diet: routine Medications: Percocet Condition: improved Instructions: refer to practice specific booklet Discharge to: home Follow-up Information    Follow up with HARPER,CHARLES A, MD.   Specialty:  Obstetrics and Gynecology   Contact information:   [address] [place] [address] [phone]       Newborn Data: Live born female  Birth Weight: 7 lb 4.4 oz (3300 g) APGAR: 9, 9  Home with mother.  Bethany Gallagher A 11/25/2014, 8:07 AM

## 2014-11-30 ENCOUNTER — Other Ambulatory Visit: Payer: Self-pay | Admitting: Certified Nurse Midwife

## 2014-12-20 ENCOUNTER — Encounter: Payer: Self-pay | Admitting: *Deleted

## 2015-01-02 ENCOUNTER — Ambulatory Visit: Payer: Medicaid Other | Admitting: Obstetrics

## 2015-01-07 ENCOUNTER — Ambulatory Visit: Payer: Medicaid Other | Admitting: Obstetrics

## 2015-01-07 ENCOUNTER — Telehealth: Payer: Self-pay | Admitting: Obstetrics

## 2015-01-10 NOTE — Telephone Encounter (Signed)
Close encounter 

## 2015-01-28 ENCOUNTER — Ambulatory Visit: Payer: Medicaid Other | Admitting: Obstetrics

## 2015-01-30 ENCOUNTER — Ambulatory Visit: Payer: Medicaid Other | Admitting: Obstetrics

## 2015-02-04 ENCOUNTER — Ambulatory Visit: Payer: Medicaid Other | Admitting: Obstetrics

## 2015-02-20 ENCOUNTER — Ambulatory Visit: Payer: Medicaid Other | Admitting: Obstetrics

## 2015-03-19 ENCOUNTER — Ambulatory Visit: Payer: Medicaid Other | Admitting: Obstetrics

## 2015-11-21 ENCOUNTER — Encounter (HOSPITAL_COMMUNITY): Payer: Self-pay | Admitting: *Deleted

## 2015-11-21 ENCOUNTER — Inpatient Hospital Stay (HOSPITAL_COMMUNITY)
Admission: AD | Admit: 2015-11-21 | Discharge: 2015-11-21 | Disposition: A | Discharge status: Home / Self Care | Payer: Medicaid Other | Source: Ambulatory Visit | Attending: Obstetrics & Gynecology | Admitting: Obstetrics & Gynecology

## 2015-11-21 DIAGNOSIS — O26891 Other specified pregnancy related conditions, first trimester: Secondary | ICD-10-CM | POA: Insufficient documentation

## 2015-11-21 DIAGNOSIS — R1013 Epigastric pain: Secondary | ICD-10-CM | POA: Insufficient documentation

## 2015-11-21 DIAGNOSIS — Z3A10 10 weeks gestation of pregnancy: Secondary | ICD-10-CM | POA: Insufficient documentation

## 2015-11-21 LAB — CBC
HCT: 31.3 % — ABNORMAL LOW (ref 36.0–46.0)
Hemoglobin: 11.1 g/dL — ABNORMAL LOW (ref 12.0–15.0)
MCH: 30.2 pg (ref 26.0–34.0)
MCHC: 35.5 g/dL (ref 30.0–36.0)
MCV: 85.1 fL (ref 78.0–100.0)
Platelets: 283 10*3/uL (ref 150–400)
RBC: 3.68 MIL/uL — ABNORMAL LOW (ref 3.87–5.11)
RDW: 13.4 % (ref 11.5–15.5)
WBC: 6.3 10*3/uL (ref 4.0–10.5)

## 2015-11-21 LAB — HCG, QUANTITATIVE, PREGNANCY: hCG, Beta Chain, Quant, S: 81007 m[IU]/mL — ABNORMAL HIGH (ref <5)

## 2015-11-21 LAB — URINALYSIS, ROUTINE W REFLEX MICROSCOPIC
Bilirubin Urine: NEGATIVE
Glucose, UA: NEGATIVE mg/dL
Hgb urine dipstick: NEGATIVE
Ketones, ur: NEGATIVE mg/dL
Nitrite: NEGATIVE
Protein, ur: NEGATIVE mg/dL
Specific Gravity, Urine: 1.025 (ref 1.005–1.030)
pH: 6 (ref 5.0–8.0)

## 2015-11-21 LAB — URINE MICROSCOPIC-ADD ON: RBC / HPF: NONE SEEN RBC/hpf (ref 0–5)

## 2015-11-21 LAB — POCT PREGNANCY, URINE: Preg Test, Ur: POSITIVE — AB

## 2015-11-21 LAB — ABO/RH: ABO/RH(D): O POS

## 2015-11-21 MED ORDER — GI COCKTAIL ~~LOC~~
30.0000 mL | Freq: Once | ORAL | Status: AC
  Administered 2015-11-21: 30 mL via ORAL
  Filled 2015-11-21: qty 30

## 2015-11-21 MED ORDER — PANTOPRAZOLE SODIUM 20 MG PO TBEC: 20.0000 mg | DELAYED_RELEASE_TABLET | Freq: Every day | ORAL | 1 refills | Status: DC

## 2015-11-21 MED ORDER — PRENATAL VITAMINS 0.8 MG PO TABS: 1.0000 | ORAL_TABLET | Freq: Every day | ORAL | 12 refills | Status: DC

## 2015-11-21 NOTE — MAU Provider Note (Signed)
Chief Complaint: Abdominal Pain   First Provider Initiated Contact with Patient 11/21/15 2022        SUBJECTIVE HPI: Bethany Gallagher is a 25 y.o. G4P3003 at [redacted]w[redacted]d by LMP who presents to maternity admissions reporting single sharp upper abdominal pain, earlier this month and today.  Spotted in July.  No bleeding now   May be pregnant.   Just had a baby a year ago.  No contraception. She denies vaginal bleeding, vaginal itching/burning, urinary symptoms, h/a, dizziness, n/v, or fever/chills.    Abdominal Pain  This is a recurrent problem. The current episode started today. The onset quality is gradual. The problem occurs intermittently. The problem has been resolved. The pain is located in the epigastric region. The patient is experiencing no pain. The quality of the pain is sharp. The abdominal pain does not radiate. Pertinent negatives include no constipation, diarrhea, dysuria, fever, myalgias, nausea or vomiting. Nothing aggravates the pain. The pain is relieved by nothing. She has tried nothing for the symptoms.   RN Note: Pt C/O sharp upper abd pain a month ago, was hurting earlier today but not now.  Had a period in June, then only spotting in July.  No bleeding today, unsure if pregnant.     Past Medical History:  Diagnosis Date  . Hx of chlamydia infection   . Hx of gonorrhea   . No pertinent past medical history   . Pregnancy induced hypertension    Past Surgical History:  Procedure Laterality Date  . NO PAST SURGERIES     Social History   Social History  . Marital status: Single    Spouse name: N/A  . Number of children: N/A  . Years of education: N/A   Occupational History  . Not on file.   Social History Main Topics  . Smoking status: Never Smoker  . Smokeless tobacco: Never Used  . Alcohol use No  . Drug use: No  . Sexual activity: Yes   Other Topics Concern  . Not on file   Social History Narrative  . No narrative on file   No current  facility-administered medications on file prior to encounter.    No current outpatient prescriptions on file prior to encounter.   No Known Allergies  I have reviewed patient's Past Medical Hx, Surgical Hx, Family Hx, Social Hx, medications and allergies.   ROS:  Review of Systems  Constitutional: Negative for appetite change, chills and fever.  Respiratory: Negative for shortness of breath.   Gastrointestinal: Positive for abdominal pain (once, sharp, epigastric). Negative for constipation, diarrhea, nausea and vomiting.  Genitourinary: Negative for dysuria, pelvic pain, vaginal bleeding and vaginal discharge.  Musculoskeletal: Negative for myalgias.   Other systems negative  Physical Exam  Patient Vitals for the past 24 hrs:  BP Temp Temp src Pulse Resp Height Weight  11/21/15 1833 - - - - - 5\' 6"  (1.676 m) 52.6 kg (116 lb)  11/21/15 1830 117/68 98.4 F (36.9 C) Oral 95 18 - -   Physical Exam  Constitutional: Well-developed, well-nourished female in no acute distress.  Cardiovascular: normal rate Respiratory: normal effort GI: Abd soft, non-tender. Pos BS x 4 MS: Extremities nontender, no edema, normal ROM Neurologic: Alert and oriented x 4.  GU: Neg CVAT.  PELVIC EXAM: not indicated  Bedside US done by me showed SIUP with FHR 160  normal fluid + yolk sac CRL c/w [redacted]w[redacted]d   LAB RESULTS Results for orders placed or performed during the hospital  encounter of 11/21/15 (from the past 72 hour(s))  Urinalysis, Routine w reflex microscopic (not [facility])     Status: Abnormal   Collection Time: 11/21/15  6:07 PM  Result Value Ref Range   Color, Urine YELLOW YELLOW   APPearance CLEAR CLEAR   Specific Gravity, Urine 1.025 1.005 - 1.030   pH 6.0 5.0 - 8.0   Glucose, UA NEGATIVE NEGATIVE mg/dL   Hgb urine dipstick NEGATIVE NEGATIVE   Bilirubin Urine NEGATIVE NEGATIVE   Ketones, ur NEGATIVE NEGATIVE mg/dL   Protein, ur NEGATIVE NEGATIVE mg/dL   Nitrite NEGATIVE NEGATIVE    Leukocytes, UA TRACE (A) NEGATIVE  Urine microscopic-add on     Status: Abnormal   Collection Time: 11/21/15  6:07 PM  Result Value Ref Range   Squamous Epithelial / LPF 6-30 (A) NONE SEEN   WBC, UA 6-30 0 - 5 WBC/hpf   RBC / HPF NONE SEEN 0 - 5 RBC/hpf   Bacteria, UA MANY (A) NONE SEEN  Pregnancy, urine POC     Status: Abnormal   Collection Time: 11/21/15  6:54 PM  Result Value Ref Range   Preg Test, Ur POSITIVE (A) NEGATIVE    Comment:        THE SENSITIVITY OF THIS METHODOLOGY IS >24 mIU/mL   hCG, quantitative, pregnancy     Status: Abnormal   Collection Time: 11/21/15  8:07 PM  Result Value Ref Range   hCG, Beta Chain, Quant, S 81,007 (H) <5 mIU/mL    Comment:          GEST. AGE      CONC.  (mIU/mL)   <=1 WEEK        5 - 50     2 WEEKS       50 - 500     3 WEEKS       100 - 10,000     4 WEEKS     1,000 - 30,000     5 WEEKS     3,500 - 115,000   6-8 WEEKS     12,000 - 270,000    12 WEEKS     15,000 - 220,000        FEMALE AND NON-PREGNANT FEMALE:     LESS THAN 5 mIU/mL   HIV antibody (routine testing) (NOT for [facility])     Status: None   Collection Time: 11/21/15  8:07 PM  Result Value Ref Range   HIV Screen 4th Generation wRfx Non Reactive Non Reactive    Comment: (NOTE) Performed At: [facility] [address] [place], [place] [id number] Mila HomerHancock William F MD GE:[phone]   CBC     Status: Abnormal   Collection Time: 11/21/15  8:07 PM  Result Value Ref Range   WBC 6.3 4.0 - 10.5 K/uL   RBC 3.68 (L) 3.87 - 5.11 MIL/uL   Hemoglobin 11.1 (L) 12.0 - 15.0 g/dL   HCT 01.031.3 (L) 27.236.0 - 53.646.0 %   MCV 85.1 78.0 - 100.0 fL   MCH 30.2 26.0 - 34.0 pg   MCHC 35.5 30.0 - 36.0 g/dL   RDW 64.413.4 03.411.5 - 74.215.5 %   Platelets 283 150 - 400 K/uL  ABO/Rh     Status: None   Collection Time: 11/21/15  8:07 PM  Result Value Ref Range   ABO/RH(D) O POS     --/--/O POS (08/17 2007)  IMAGING No results found.  MAU Management/MDM: Ordered usual first trimester r/o ectopic labs.  Pelvic exam not done checked baseline Ultrasound to rule out ectopic and determine viability, which it did.  Consult Dr Langston MaskerMorris with presentation, exam findings, and results.   Treatments in MAU included GI cocktail, which made epigastric pain go away.    ASSESSMENT Epigastric abdominal pain Single intrauterine pregnancy, live   PLAN Discharge home Rx Protonix 20mg  qd for presumed acid reflux Encouraged to begin prenatal care  Pt stable at time of discharge. Encouraged to return here or to other Urgent Care/ED if she develops worsening of symptoms, increase in pain, fever, or other concerning symptoms.    Wynelle BourgeoisMarie Geraldine Tesar CNM, MSN Certified Nurse-Midwife 11/21/2015  8:54 PM

## 2015-11-21 NOTE — MAU Note (Signed)
Pt C/O sharp upper abd pain a month ago, was hurting earlier today but not now.  Had a period in June, then only spotting in July.  No bleeding today, unsure if pregnant.

## 2015-11-21 NOTE — Discharge Instructions (Signed)

## 2015-11-22 LAB — HIV ANTIBODY (ROUTINE TESTING W REFLEX): HIV Screen 4th Generation wRfx: NONREACTIVE

## 2015-12-01 ENCOUNTER — Encounter: Payer: Self-pay | Admitting: Advanced Practice Midwife

## 2015-12-01 DIAGNOSIS — O09893 Supervision of other high risk pregnancies, third trimester: Secondary | ICD-10-CM | POA: Insufficient documentation

## 2015-12-29 ENCOUNTER — Inpatient Hospital Stay (HOSPITAL_COMMUNITY)
Admission: AD | Admit: 2015-12-29 | Discharge: 2015-12-29 | Disposition: A | Discharge status: Home / Self Care | Payer: Medicaid Other | Source: Ambulatory Visit | Attending: Obstetrics & Gynecology | Admitting: Obstetrics & Gynecology

## 2015-12-29 ENCOUNTER — Encounter (HOSPITAL_COMMUNITY): Payer: Self-pay

## 2015-12-29 DIAGNOSIS — O23591 Infection of other part of genital tract in pregnancy, first trimester: Secondary | ICD-10-CM | POA: Insufficient documentation

## 2015-12-29 DIAGNOSIS — B9689 Other specified bacterial agents as the cause of diseases classified elsewhere: Secondary | ICD-10-CM

## 2015-12-29 DIAGNOSIS — O4692 Antepartum hemorrhage, unspecified, second trimester: Secondary | ICD-10-CM

## 2015-12-29 DIAGNOSIS — O98811 Other maternal infectious and parasitic diseases complicating pregnancy, first trimester: Secondary | ICD-10-CM | POA: Insufficient documentation

## 2015-12-29 DIAGNOSIS — Z3A15 15 weeks gestation of pregnancy: Secondary | ICD-10-CM | POA: Insufficient documentation

## 2015-12-29 DIAGNOSIS — O23592 Infection of other part of genital tract in pregnancy, second trimester: Secondary | ICD-10-CM

## 2015-12-29 DIAGNOSIS — O26852 Spotting complicating pregnancy, second trimester: Secondary | ICD-10-CM | POA: Insufficient documentation

## 2015-12-29 DIAGNOSIS — B373 Candidiasis of vulva and vagina: Secondary | ICD-10-CM | POA: Insufficient documentation

## 2015-12-29 DIAGNOSIS — B3731 Acute candidiasis of vulva and vagina: Secondary | ICD-10-CM

## 2015-12-29 DIAGNOSIS — O09891 Supervision of other high risk pregnancies, first trimester: Secondary | ICD-10-CM

## 2015-12-29 DIAGNOSIS — N760 Acute vaginitis: Secondary | ICD-10-CM

## 2015-12-29 DIAGNOSIS — O98812 Other maternal infectious and parasitic diseases complicating pregnancy, second trimester: Secondary | ICD-10-CM

## 2015-12-29 LAB — WET PREP, GENITAL
Sperm: NONE SEEN
Trich, Wet Prep: NONE SEEN

## 2015-12-29 LAB — URINE MICROSCOPIC-ADD ON

## 2015-12-29 LAB — URINALYSIS, ROUTINE W REFLEX MICROSCOPIC
Bilirubin Urine: NEGATIVE
Glucose, UA: NEGATIVE mg/dL
Hgb urine dipstick: NEGATIVE
Ketones, ur: NEGATIVE mg/dL
Nitrite: NEGATIVE
Protein, ur: NEGATIVE mg/dL
Specific Gravity, Urine: 1.02 (ref 1.005–1.030)
pH: 6 (ref 5.0–8.0)

## 2015-12-29 MED ORDER — METRONIDAZOLE 500 MG PO TABS: 500.0000 mg | ORAL_TABLET | Freq: Two times a day (BID) | ORAL | 0 refills | Status: DC

## 2015-12-29 MED ORDER — TERCONAZOLE 0.4 % VA CREA: 1.0000 | TOPICAL_CREAM | Freq: Every day | VAGINAL | 0 refills | Status: DC

## 2015-12-29 NOTE — MAU Note (Signed)
Was spotting earlier today,  Off and on for almost a month.  Initially lasted  A couple days.  Has had cramps off and on.

## 2015-12-29 NOTE — MAU Provider Note (Signed)
History     CSN: [id number]  Arrival date and time: 12/29/15 [id number]   First Provider Initiated Contact with Patient 12/29/15 1907      Chief Complaint  Patient presents with  . Vaginal Bleeding   HPI Bethany Gallagher 25 y.o. 7641w4d  Comes to MAU with vaginal spotting for approximately 3 weeks.  Had stopped and then she had intercourse and has had intermittent spotting since the intercourse.  Is client of Dr. Langston MaskerMorris.  Has been to the office for her first prenatal visit and has another appointment in October.  Is not having other problems with vaginal discharge.  Notices spotting when she wipes.  OB History    Gravida Para Term Preterm AB Living   4 3 3  0 0 3   SAB TAB Ectopic Multiple Live Births   0 0 0 0 3      Past Medical History:  Diagnosis Date  . Hx of chlamydia infection   . Hx of gonorrhea   . No pertinent past medical history   . Pregnancy induced hypertension     Past Surgical History:  Procedure Laterality Date  . NO PAST SURGERIES      Family History  Problem Relation Age of Onset  . Alcohol abuse Neg Hx     Social History  Substance Use Topics  . Smoking status: Never Smoker  . Smokeless tobacco: Never Used  . Alcohol use No    Allergies: No Known Allergies  Prescriptions Prior to Admission  Medication Sig Dispense Refill Last Dose  . pantoprazole (PROTONIX) 20 MG tablet Take 1 tablet (20 mg total) by mouth daily. 30 tablet 1   . Prenatal Multivit-Min-Fe-FA (PRENATAL VITAMINS) 0.8 MG tablet Take 1 tablet by mouth daily. 30 tablet 12     Review of Systems  Constitutional: Negative for fever.  Gastrointestinal: Negative for abdominal pain, nausea and vomiting.  Genitourinary:       No vaginal discharge. Intermittent vaginal bleeding. No dysuria.   Physical Exam   Blood pressure 120/66, pulse 84, temperature 98.7 F (37.1 C), temperature source Oral, resp. rate 16, last menstrual period 09/11/2015, unknown if currently  breastfeeding.  Physical Exam  Nursing note and vitals reviewed. Constitutional: She is oriented to person, place, and time. She appears well-developed and well-nourished.  HENT:  Head: Normocephalic.  Eyes: EOM are normal.  Neck: Neck supple.  GI: Soft. There is no tenderness.  Genitourinary:  Genitourinary Comments: Speculum exam: Vagina - Small amount of white discharge, no blood in the vagina Cervix - Small amount of contact bleeding noted from the touch of the speculum on her cervix.  Cervix appears closed, long and thick Bimanual exam deferred. GC/Chlam, wet prep done Chaperone present for exam.   Musculoskeletal: Normal range of motion.  Neurological: She is alert and oriented to person, place, and time.  Skin: Skin is warm and dry.  Psychiatric: She has a normal mood and affect.    MAU Course  Procedures Results for orders placed or performed during the hospital encounter of 12/29/15 (from the past 24 hour(s))  Urinalysis, Routine w reflex microscopic (not at [facility])     Status: Abnormal   Collection Time: 12/29/15  6:25 PM  Result Value Ref Range   Color, Urine YELLOW YELLOW   APPearance CLEAR CLEAR   Specific Gravity, Urine 1.020 1.005 - 1.030   pH 6.0 5.0 - 8.0   Glucose, UA NEGATIVE NEGATIVE mg/dL   Hgb urine dipstick NEGATIVE NEGATIVE  Bilirubin Urine NEGATIVE NEGATIVE   Ketones, ur NEGATIVE NEGATIVE mg/dL   Protein, ur NEGATIVE NEGATIVE mg/dL   Nitrite NEGATIVE NEGATIVE   Leukocytes, UA MODERATE (A) NEGATIVE  Urine microscopic-add on     Status: Abnormal   Collection Time: 12/29/15  6:25 PM  Result Value Ref Range   Squamous Epithelial / LPF 0-5 (A) NONE SEEN   WBC, UA 6-30 0 - 5 WBC/hpf   RBC / HPF 0-5 0 - 5 RBC/hpf   Bacteria, UA MANY (A) NONE SEEN  Wet prep, genital     Status: Abnormal   Collection Time: 12/29/15  8:02 PM  Result Value Ref Range   Yeast Wet Prep HPF POC PRESENT (A) NONE SEEN   Trich, Wet Prep NONE SEEN NONE SEEN   Clue Cells Wet  Prep HPF POC PRESENT (A) NONE SEEN   WBC, Wet Prep HPF POC MANY (A) NONE SEEN   Sperm NONE SEEN     MDM   Assessment and Plan  Vaginal bleeding in pregancy - second trimester - likely due to intercourse. Bacterial vaginosis Yeast  Plan Continue prenatal care. Labs pending. No intercourse until seen in the office again. Metronidazole bid x 7 days terazol vaginal cream for yeast  Aarit Kashuba L Jahmari Esbenshade 12/29/2015, 8:50 PM

## 2015-12-29 NOTE — Discharge Instructions (Signed)
Keep your appointments in the office. Get your medications from your pharmacy and use as directed.

## 2015-12-30 LAB — GC/CHLAMYDIA PROBE AMP (~~LOC~~) NOT AT ARMC
Chlamydia: NEGATIVE
Neisseria Gonorrhea: NEGATIVE

## 2015-12-30 LAB — RPR: RPR Ser Ql: NONREACTIVE

## 2015-12-30 LAB — HIV ANTIBODY (ROUTINE TESTING W REFLEX): HIV Screen 4th Generation wRfx: NONREACTIVE

## 2015-12-31 LAB — URINE CULTURE: Culture: <10000 — AB

## 2016-01-09 ENCOUNTER — Emergency Department (HOSPITAL_COMMUNITY): Payer: Medicaid Other

## 2016-01-09 ENCOUNTER — Emergency Department (HOSPITAL_COMMUNITY)
Admission: EM | Admit: 2016-01-09 | Discharge: 2016-01-09 | Disposition: A | Discharge status: Home / Self Care | Payer: Medicaid Other | Attending: Emergency Medicine | Admitting: Emergency Medicine

## 2016-01-09 ENCOUNTER — Encounter (HOSPITAL_COMMUNITY): Payer: Self-pay | Admitting: *Deleted

## 2016-01-09 DIAGNOSIS — Y939 Activity, unspecified: Secondary | ICD-10-CM | POA: Insufficient documentation

## 2016-01-09 DIAGNOSIS — S0181XA Laceration without foreign body of other part of head, initial encounter: Secondary | ICD-10-CM | POA: Insufficient documentation

## 2016-01-09 DIAGNOSIS — W268XXA Contact with other sharp object(s), not elsewhere classified, initial encounter: Secondary | ICD-10-CM | POA: Insufficient documentation

## 2016-01-09 DIAGNOSIS — Y999 Unspecified external cause status: Secondary | ICD-10-CM | POA: Insufficient documentation

## 2016-01-09 DIAGNOSIS — S0990XA Unspecified injury of head, initial encounter: Secondary | ICD-10-CM | POA: Diagnosis present

## 2016-01-09 DIAGNOSIS — Y929 Unspecified place or not applicable: Secondary | ICD-10-CM | POA: Diagnosis not present

## 2016-01-09 MED ORDER — LIDOCAINE-EPINEPHRINE 2 %-1:100000 IJ SOLN
20.0000 mL | Freq: Once | INTRAMUSCULAR | Status: AC
  Administered 2016-01-09: 1 mL
  Filled 2016-01-09: qty 20

## 2016-01-09 MED ORDER — LIDOCAINE-EPINEPHRINE (PF) 2 %-1:200000 IJ SOLN
20.0000 mL | Freq: Once | INTRAMUSCULAR | Status: DC
  Filled 2016-01-09: qty 20

## 2016-01-09 NOTE — Discharge Instructions (Signed)
Follow-up with Dr. Jenne PaneBates regarding the weakness in her forehead. Sutures should be removed in 5-7 days. Return to the ED if you develop new or worsening symptoms.

## 2016-01-09 NOTE — ED Notes (Signed)
Denies concern with dc 

## 2016-01-09 NOTE — ED Provider Notes (Signed)
[facility]-EMERGENCY DEPT Provider Note   CSN: [id number] Arrival date & time: 01/09/16  1724  By signing my name below, I, Sandrea Hammond, attest that this documentation has been prepared under the direction and in the presence of Glynn Octave, MD. Electronically Signed: Sandrea Hammond, ED Scribe. 01/09/16. 7:50 PM.   History   Chief Complaint Chief Complaint  Patient presents with  . Head Injury  . Head Laceration    HPI Comments: Bethany Gallagher is a 24 y.o. female who presents to the Emergency Department due to a laceration she suffered when she hit her head on the corner of a door in her home earlier today. Pt denies LOC, dizziness, or vomiting. Pt says the laceration bled generously at the time of the injury. Pt denies previous episodes of dizziness or head injury. Pt denies any other pain in her body. Pt denies neck pain, back pain, diplopia, or other vision changes. She says her tetanus status is UTD. Pt reports no other symptoms or complaints at this time.   The history is provided by the patient. No language interpreter was used.    Past Medical History:  Diagnosis Date  . Hx of chlamydia infection   . Hx of gonorrhea   . No pertinent past medical history   . Pregnancy induced hypertension     Patient Active Problem List   Diagnosis Date Noted  . Short interval between pregnancies affecting pregnancy in first trimester, antepartum 12/01/2015  . Active labor 11/23/2014  . NSVD (normal spontaneous vaginal delivery) 11/23/2014  . GBS (group B streptococcus) UTI complicating pregnancy 10/29/2014  . Trichimoniasis 06/29/2014  . Insufficient prenatal care in second trimester 06/29/2014  . Normal delivery 12/31/2011  . Intrauterine growth restriction affecting antepartum care of mother 12/30/2011  . PIH (pregnancy induced hypertension) 12/30/2011    Past Surgical History:  Procedure Laterality Date  . NO PAST SURGERIES      OB History    Gravida Para Term Preterm  AB Living   4 3 3  0 0 3   SAB TAB Ectopic Multiple Live Births   0 0 0 0 3       Home Medications    Prior to Admission medications   Medication Sig Start Date End Date Taking? Authorizing Provider  metroNIDAZOLE (FLAGYL) 500 MG tablet Take 1 tablet (500 mg total) by mouth 2 (two) times daily. No alcohol while taking this medication 12/29/15   Currie Paris, NP  pantoprazole (PROTONIX) 20 MG tablet Take 1 tablet (20 mg total) by mouth daily. 11/21/15 11/20/16  Aviva Signs, CNM  Prenatal Multivit-Min-Fe-FA (PRENATAL VITAMINS) 0.8 MG tablet Take 1 tablet by mouth daily. 11/21/15   Aviva Signs, CNM  terconazole (TERAZOL 7) 0.4 % vaginal cream Place 1 applicator vaginally at bedtime. Use for 7 days. 12/29/15   Currie Paris, NP    Family History Family History  Problem Relation Age of Onset  . Alcohol abuse Neg Hx     Social History Social History  Substance Use Topics  . Smoking status: Never Smoker  . Smokeless tobacco: Never Used  . Alcohol use No     Allergies   Review of patient's allergies indicates no known allergies.   Review of Systems Review of Systems  Eyes: Negative for visual disturbance.  Gastrointestinal: Negative for vomiting.  Musculoskeletal: Negative for back pain and neck pain.  Skin: Positive for wound.  Neurological: Negative for dizziness and syncope.     Physical Exam Updated  Vital Signs BP 103/72   Pulse 84   Temp 98.7 F (37.1 C) (Oral)   Resp 20   Ht 5\' 4"  (1.626 m)   Wt 125 lb (56.7 kg)   LMP 09/11/2015 (Approximate)   SpO2 100%   BMI 21.46 kg/m   Physical Exam  Constitutional: She is oriented to person, place, and time. She appears well-developed and well-nourished. No distress.  HENT:  Head: Normocephalic.  Mouth/Throat: Oropharynx is clear and moist. No oropharyngeal exudate.  Eyes: Conjunctivae and EOM are normal. Pupils are equal, round, and reactive to light.  Neck: Normal range of motion. Neck supple.  No  meningismus.  Cardiovascular: Normal rate, regular rhythm, normal heart sounds and intact distal pulses.   No murmur heard. Pulmonary/Chest: Effort normal and breath sounds normal. No respiratory distress.  Abdominal: Soft. There is no tenderness. There is no rebound and no guarding.  Musculoskeletal: Normal range of motion. She exhibits no edema.  No TTP over the cervical spine  Neurological: She is alert and oriented to person, place, and time. No cranial nerve deficit. She exhibits normal muscle tone. Coordination normal.  5/5 strength throughout (weakness in left eyebrow raise). CN 2-12 intact. Equal grip strength.   Skin: Skin is warm.  Left forehead shows 2 cm by 3 cm V-shaped laceration to the skin and muscle  Psychiatric: She has a normal mood and affect. Her behavior is normal.  Nursing note and vitals reviewed.      ED Treatments / Results   DIAGNOSTIC STUDIES: Oxygen Saturation is 100% on RA, normal by my interpretation.    COORDINATION OF CARE: 5:41 PM Discussed treatment plan with pt at bedside and pt agreed to plan.   Labs (all labs ordered are listed, but only abnormal results are displayed) Labs Reviewed - No data to display  EKG  EKG Interpretation None       Radiology No results found.  Procedures Procedures (including critical care time)  LACERATION REPAIR PROCEDURE NOTE The patient's identification was confirmed and consent was obtained. This procedure was performed by Glynn OctaveStephen Annie Saephan, MD at 7:50 PM. Site: left forehead Sterile procedures observed Anesthetic used (type and amt): 2% lidocaine with epinephrine Suture type/size: 5-0 prolene Length: 2x3 cm # of Sutures: 12 Technique:simple interrupted Tetanus UTD Site anesthetized, irrigated with NS, explored without evidence of foreign body, wound well approximated, site covered with dry, sterile dressing.  Patient tolerated procedure well without complications. Instructions for care  discussed verbally and patient provided with additional written instructions for homecare and f/u.   Medications Ordered in ED Medications - No data to display   Initial Impression / Assessment and Plan / ED Course  I have reviewed the triage vital signs and the nursing notes.  Pertinent labs & imaging results that were available during my care of the patient were reviewed by me and considered in my medical decision making (see chart for details).  Clinical Course  Laceration and head injury sustained while running through the house. Denies loss of consciousness. No C-spine pain no focal weakness, numbness or tingling.  There is V shaped laceration to left forehead with some weakness of the left frontalis muscle.  Discussed with Dr. Jenne PaneBates of facial trauma. He states patient could have small branch nerve or muscle involvement. He does not recommend surgical exploration if wound does not extend past lateral canthus. He states this should again function on its own. He will see in office.   Laceration repaired as above. Tetanus is up-to-date.  Patient instructed  to follow-up for suture removal in 5-7 days. Will need to follow up with ENT given her left frontalis muscle weakness as well   Fi.nal Clinical Impressions(s) / ED Diagnoses   Final diagnoses:  Laceration of forehead, initial encounter    New Prescriptions New Prescriptions   No medications on file   I personally performed the services described in this documentation, which was scribed in my presence. The recorded information has been reviewed and is accurate.      Glynn Octave, MD 01/09/16 2036

## 2016-01-09 NOTE — ED Triage Notes (Signed)
Pt reports exact same story from EMS 

## 2016-01-09 NOTE — ED Triage Notes (Signed)
Per EMS: pt coming from home with c/o fall and head laceration. Pt denies LOC, denies headache, no neck tenderness, bleeding controlled. Pt has laceration to left temporal. VSS, Pt A&Ox4, respirations equal and unlabored, skin warm and dry

## 2016-03-05 ENCOUNTER — Encounter: Payer: Self-pay | Admitting: Certified Nurse Midwife

## 2016-04-03 ENCOUNTER — Other Ambulatory Visit (HOSPITAL_COMMUNITY)
Admission: RE | Admit: 2016-04-03 | Discharge: 2016-04-03 | Disposition: A | Discharge status: Home / Self Care | Payer: Medicaid Other | Source: Ambulatory Visit | Attending: Obstetrics | Admitting: Obstetrics

## 2016-04-03 ENCOUNTER — Ambulatory Visit (INDEPENDENT_AMBULATORY_CARE_PROVIDER_SITE_OTHER): Payer: Medicaid Other | Admitting: Certified Nurse Midwife

## 2016-04-03 ENCOUNTER — Encounter: Payer: Self-pay | Admitting: *Deleted

## 2016-04-03 VITALS — BP 118/74 | HR 84 | Wt 128.0 lb

## 2016-04-03 DIAGNOSIS — N760 Acute vaginitis: Secondary | ICD-10-CM

## 2016-04-03 DIAGNOSIS — O3433 Maternal care for cervical incompetence, third trimester: Secondary | ICD-10-CM

## 2016-04-03 DIAGNOSIS — O09893 Supervision of other high risk pregnancies, third trimester: Secondary | ICD-10-CM

## 2016-04-03 DIAGNOSIS — O0933 Supervision of pregnancy with insufficient antenatal care, third trimester: Secondary | ICD-10-CM

## 2016-04-03 DIAGNOSIS — Z01419 Encounter for gynecological examination (general) (routine) without abnormal findings: Secondary | ICD-10-CM | POA: Insufficient documentation

## 2016-04-03 DIAGNOSIS — O4693 Antepartum hemorrhage, unspecified, third trimester: Secondary | ICD-10-CM

## 2016-04-03 DIAGNOSIS — Z3480 Encounter for supervision of other normal pregnancy, unspecified trimester: Secondary | ICD-10-CM | POA: Insufficient documentation

## 2016-04-03 DIAGNOSIS — B9689 Other specified bacterial agents as the cause of diseases classified elsewhere: Secondary | ICD-10-CM

## 2016-04-03 MED ORDER — METRONIDAZOLE 500 MG PO TABS: 500.0000 mg | ORAL_TABLET | Freq: Two times a day (BID) | ORAL | 0 refills | Status: DC

## 2016-04-03 NOTE — Addendum Note (Signed)
Addended by: Marya LandryFOSTER, Raylea Adcox D on: 04/03/2016 12:46 PM   Modules accepted: Orders

## 2016-04-03 NOTE — Progress Notes (Signed)
Pt states that she has had bleeding with this pregnancy, still having bleeding now.

## 2016-04-03 NOTE — Progress Notes (Signed)
Subjective:    Bethany Gallagher is being seen today for her first obstetrical visit.  This is not a planned pregnancy. She is at [redacted]w[redacted]d gestation. Her obstetrical history is significant for intrauterine growth restriction (IUGR), pre-eclampsia and late to prenatal care at 29 weeks, close spaced pregnancies X3. Relationship with FOB: significant other, living together. Patient does intend to breast feed. Pregnancy history fully reviewed.  The information documented in the HPI was reviewed and verified.  Menstrual History: OB History    Gravida Para Term Preterm AB Living   4 3 3  0 0 3   SAB TAB Ectopic Multiple Live Births   0 0 0 0 3       Patient's last menstrual period was 09/11/2015 (approximate).    Past Medical History:  Diagnosis Date  . Hx of chlamydia infection   . Hx of gonorrhea   . No pertinent past medical history   . Pregnancy induced hypertension     Past Surgical History:  Procedure Laterality Date  . NO PAST SURGERIES       (Not in a hospital admission) No Known Allergies  Social History  Substance Use Topics  . Smoking status: Never Smoker  . Smokeless tobacco: Never Used  . Alcohol use No    Family History  Problem Relation Age of Onset  . Alcohol abuse Neg Hx      Review of Systems Constitutional: negative for weight loss Gastrointestinal: negative for vomiting Genitourinary:negative for genital lesions and vaginal discharge and dysuria, + vaginal bleeding since start of pregnancy, not currently sexually active.  Musculoskeletal:negative for back pain Behavioral/Psych: negative for abusive relationship, depression, illegal drug usage and tobacco use    Objective:    BP 118/74   Pulse 84   Wt 128 lb (58.1 kg)   LMP 09/11/2015 (Approximate)   BMI 21.97 kg/m  General Appearance:    Alert, cooperative, no distress, appears stated age  Head:    Normocephalic, without obvious abnormality, atraumatic  Eyes:    PERRL, conjunctiva/corneas clear,  EOM's intact, fundi    benign, both eyes  Ears:    Normal TM's and external ear canals, both ears  Nose:   Nares normal, septum midline, mucosa normal, no drainage    or sinus tenderness  Throat:   Lips, mucosa, and tongue normal; teeth and gums normal  Neck:   Supple, symmetrical, trachea midline, no adenopathy;    thyroid:  no enlargement/tenderness/nodules; no carotid   bruit or JVD  Back:     Symmetric, no curvature, ROM normal, no CVA tenderness  Lungs:     Clear to auscultation bilaterally, respirations unlabored  Chest Wall:    No tenderness or deformity   Heart:    Regular rate and rhythm, S1 and S2 normal, no murmur, rub   or gallop  Breast Exam:    No tenderness, masses, or nipple abnormality  Abdomen:     Soft, non-tender, bowel sounds active all four quadrants,    no masses, no organomegaly  Genitalia:    Normal female without lesion, discharge or tenderness  Extremities:   Extremities normal, atraumatic, no cyanosis or edema  Pulses:   2+ and symmetric all extremities  Skin:   Skin color, texture, turgor normal, no rashes or lesions  Lymph nodes:   Cervical, supraclavicular, and axillary nodes normal  Neurologic:   CNII-XII intact, normal strength, sensation and reflexes    throughout        Cervix:   50 %  effaced, 1 cm dilated and midline.  FH: 29cm,  FHR:  145 by doppler.  Vaginal bleeding present.  Friable on exam.     Lab Review Urine pregnancy test Labs reviewed yes Radiologic studies reviewed no Assessment:    Pregnancy at 8361w2d weeks   Vaginal bleeding in pregnancy  Premature dilation  H/O BV this pregnancy ?treatment  Plan:    Discussed POC with Dr. Clearance CootsHarper Out of work, works 3 12 hour shifts, d/t bleeding and preterm cervical dilation. Prenatal vitamins.  Counseling provided regarding continued use of seat belts, cessation of alcohol consumption, smoking or use of illicit drugs; infection precautions i.e., influenza/TDAP immunizations, toxoplasmosis,CMV,  parvovirus, listeria and varicella; workplace safety, exercise during pregnancy; routine dental care, safe medications, sexual activity, hot tubs, saunas, pools, travel, caffeine use, fish and methlymercury, potential toxins, hair treatments, varicose veins Weight gain recommendations per IOM guidelines reviewed: underweight/BMI< 18.5--> gain 28 - 40 lbs; normal weight/BMI 18.5 - 24.9--> gain 25 - 35 lbs; overweight/BMI 25 - 29.9--> gain 15 - 25 lbs; obese/BMI >30->gain  11 - 20 lbs Problem list reviewed and updated. FIRST/CF mutation testing/NIPT/QUAD SCREEN/fragile X/Ashkenazi Jewish population testing/Spinal muscular atrophy discussed: ordered. Role of ultrasound in pregnancy discussed; fetal survey: ordered. Amniocentesis discussed: not indicated. VBAC calculator score: VBAC consent form provided Meds ordered this encounter  Medications  . metroNIDAZOLE (FLAGYL) 500 MG tablet    Sig: Take 1 tablet (500 mg total) by mouth 2 (two) times daily.    Dispense:  28 tablet    Refill:  0   Orders Placed This Encounter  Procedures  . Culture, OB Urine  . US MFM OB COMP + 14 WK    Standing Status:   Future    Standing Expiration Date:   06/03/2017    Order Specific Question:   Reason for Exam (SYMPTOM  OR DIAGNOSIS REQUIRED)    Answer:   fetal anatomy scan, late to prenatal care, close spaced pregnancies    Order Specific Question:   Preferred imaging location?    Answer:   [facility]-Ultrasound  . US MFM OB Transvaginal    Standing Status:   Future    Standing Expiration Date:   06/03/2017    Order Specific Question:   Reason for Exam (SYMPTOM  OR DIAGNOSIS REQUIRED)    Answer:   preterm dilation with vaginal bleeding, not currently sexually active ?previa    Order Specific Question:   Preferred imaging location?    Answer:   [facility]-Ultrasound  . TSH  . Hemoglobinopathy evaluation  . Varicella zoster antibody, IgG  . MaterniT21 PLUS Core+SCA    Order Specific Question:   Is the patient insulin  dependent?    Answer:   No    Order Specific Question:   Please enter gestational age. This should be expressed as weeks AND days, i.e. 16w 6d. Enter weeks here. Enter days in next question.    Answer:   1729    Order Specific Question:   Please enter gestational age. This should be expressed as weeks AND days, i.e. 16w 6d. Enter days here. Enter weeks in previous question.    Answer:   3    Order Specific Question:   How was gestational age calculated?    Answer:   LMP    Order Specific Question:   Please give the date of LMP OR Ultrasound OR Estimated date of delivery.    Answer:   06/17/2016    Order Specific Question:   Number of  Fetuses (Type of Pregnancy):    Answer:   1    Order Specific Question:   Indications for performing the test? (please choose all that apply):    Answer:   Routine screening    Order Specific Question:   Other Indications? (Y=Yes, N=No)    Answer:   N    Order Specific Question:   If this is a repeat specimen, please indicate the reason:    Answer:   Not indicated    Order Specific Question:   Please specify the patient's race: (C=White/Caucasion, B=Black, I=Native American, A=Asian, H=Hispanic, O=Other, U=Unknown)    Answer:   B    Order Specific Question:   Donor Egg - indicate if the egg was obtained from in vitro fertilization.    Answer:   N    Order Specific Question:   Age of Egg Donor.    Answer:   [age]    Order Specific Question:   Prior Down Syndrome/ONTD screening during current pregnancy.    Answer:   N    Order Specific Question:   Prior First Trimester Testing    Answer:   N    Order Specific Question:   Prior Second Trimester Testing    Answer:   N    Order Specific Question:   Family History of Neural Tube Defects    Answer:   N    Order Specific Question:   Prior Pregnancy with Down Syndrome    Answer:   N    Order Specific Question:   Please give the patient's weight (in pounds)    Answer:   125  . ToxASSURE Select 13 (MW), Urine  .  Hemoglobin A1c  . Obstetric Panel, Including HIV  . Cystic Fibrosis Mutation 97  . AMB referral to maternal fetal medicine    Referral Priority:   Routine    Referral Type:   Consultation    Referral Reason:   Specialty Services Required    Number of Visits Requested:   1    Follow up in 2 weeks. 50% of 30 min visit spent on counseling and coordination of care.

## 2016-04-03 NOTE — Addendum Note (Signed)
Addended by: Marya LandryFOSTER, Nayson Traweek D on: 04/03/2016 11:56 AM   Modules accepted: Orders

## 2016-04-05 LAB — URINE CULTURE, OB REFLEX

## 2016-04-05 LAB — CULTURE, OB URINE

## 2016-04-06 NOTE — L&D Delivery Note (Signed)
Delivery Note Progressed quickly to complete dilation and pushed very well.    At 12:23 AM a viable and healthy female was delivered via Vaginal, Spontaneous Delivery (Presentation: OA  ).  APGAR: ; weight  .   Placenta status: spontaneously and grossly intact with 3 vessel Cord:  with the following complications: none  Anesthesia:  epidural Episiotomy: None Lacerations: None Suture Repair: none Est. Blood Loss (mL): 75  Mom to postpartum.  Baby to Couplet care / Skin to Skin.  Bethany Gallagher 06/09/2016, 12:36 AM

## 2016-04-06 NOTE — L&D Delivery Note (Addendum)
Vaginal Delivery Note  26 y.o. G4P3003 at 10185w6d delivered a viable female infant at 0023 in cephalic, OA position. No nuchal cord. Right anterior shoulder delivered with ease. Sixty sec delayed cord clamping. Cord clamped x2 and cut. Placenta delivered spontaneously intact, with 3VC. Fundus firm on exam with massage and pitocin.  Mother: Anesthesia: Epidural Laceration: None Suture repair: N/A  EBL: 75 mL  Baby: Apgars: 8, 9 Weight: Pending Cord pH: Not sent  Good hemostasis noted. Mom to postpartum.  Baby to Couplet care / Skin to Skin.  Wendee Beaversavid J McMullen, DO, PGY-1 06/09/2016, 12:36 AM   I was gloved and present for entire delivery SVD without incident No difficulty with shoulders No lacerations  Aviva SignsMarie L Cynde Menard, CNM

## 2016-04-07 ENCOUNTER — Inpatient Hospital Stay (HOSPITAL_COMMUNITY): Payer: Medicaid Other

## 2016-04-07 ENCOUNTER — Encounter (HOSPITAL_COMMUNITY): Payer: Self-pay | Admitting: *Deleted

## 2016-04-07 ENCOUNTER — Inpatient Hospital Stay (HOSPITAL_COMMUNITY)
Admission: AD | Admit: 2016-04-07 | Discharge: 2016-04-07 | Disposition: A | Discharge status: Home / Self Care | Payer: Medicaid Other | Source: Ambulatory Visit | Attending: Family Medicine | Admitting: Family Medicine

## 2016-04-07 ENCOUNTER — Encounter (HOSPITAL_COMMUNITY): Payer: Self-pay | Admitting: Certified Nurse Midwife

## 2016-04-07 DIAGNOSIS — O4693 Antepartum hemorrhage, unspecified, third trimester: Secondary | ICD-10-CM | POA: Diagnosis present

## 2016-04-07 DIAGNOSIS — O4703 False labor before 37 completed weeks of gestation, third trimester: Secondary | ICD-10-CM

## 2016-04-07 DIAGNOSIS — O4700 False labor before 37 completed weeks of gestation, unspecified trimester: Secondary | ICD-10-CM

## 2016-04-07 DIAGNOSIS — Z3A30 30 weeks gestation of pregnancy: Secondary | ICD-10-CM

## 2016-04-07 DIAGNOSIS — O26873 Cervical shortening, third trimester: Secondary | ICD-10-CM | POA: Insufficient documentation

## 2016-04-07 DIAGNOSIS — Z3A29 29 weeks gestation of pregnancy: Secondary | ICD-10-CM | POA: Insufficient documentation

## 2016-04-07 DIAGNOSIS — O479 False labor, unspecified: Secondary | ICD-10-CM

## 2016-04-07 LAB — CYTOLOGY - PAP: Diagnosis: NEGATIVE

## 2016-04-07 LAB — URINALYSIS, ROUTINE W REFLEX MICROSCOPIC
Bilirubin Urine: NEGATIVE
Glucose, UA: NEGATIVE mg/dL
Hgb urine dipstick: NEGATIVE
Ketones, ur: NEGATIVE mg/dL
Nitrite: NEGATIVE
Protein, ur: NEGATIVE mg/dL
Specific Gravity, Urine: 1.025 (ref 1.005–1.030)
pH: 6 (ref 5.0–8.0)

## 2016-04-07 MED ORDER — BETAMETHASONE SOD PHOS & ACET 6 (3-3) MG/ML IJ SUSP
12.0000 mg | Freq: Once | INTRAMUSCULAR | Status: AC
  Administered 2016-04-07: 12 mg via INTRAMUSCULAR
  Filled 2016-04-07: qty 2

## 2016-04-07 NOTE — MAU Note (Signed)
C/o spotting that started this AM around 0900 and then stopped but spotting started back around 1330;has had 2 episodes of vaginal bleeding before today; had first prenatal visit with [facility] on Friday; no vaginal leaking; OB has pulled her out of work;

## 2016-04-07 NOTE — MAU Provider Note (Signed)
Chief Complaint:  Vaginal Bleeding   First Provider Initiated Contact with Patient 04/07/16 1438     HPI: Bethany Gallagher is a 26 y.o. G4P3003 at 37w6dwho presents to maternity admissions reporting vaginal bleeding today.  Had some spotting in the past week or so.  Has never had an [place].Marland Kitchen She reports good fetal movement, denies LOF, vaginal itching/burning, urinary symptoms, h/a, dizziness, n/v, diarrhea, constipation or fever/chills.    Vaginal Bleeding  The patient's primary symptoms include vaginal bleeding. The patient's pertinent negatives include no genital itching, genital lesions, genital odor or pelvic pain. This is a recurrent problem. The current episode started today. The problem occurs intermittently. The problem has been waxing and waning. The patient is experiencing no pain. She is pregnant. Pertinent negatives include no abdominal pain, chills, constipation, diarrhea, fever, frequency, nausea or vomiting. The vaginal discharge was bloody. The vaginal bleeding is spotting. She has not been passing clots. She has not been passing tissue. Nothing aggravates the symptoms. She has tried nothing for the symptoms.   RN Note: C/o spotting that started this AM around 0900 and then stopped but spotting started back around 1330;has had 2 episodes of vaginal bleeding before today; had first prenatal visit with [facility] on Friday; no vaginal leaking; OB has pulled her out of work; Past Medical History: Past Medical History:  Diagnosis Date  . Hx of chlamydia infection   . Hx of gonorrhea   . No pertinent past medical history   . Pregnancy induced hypertension     Past obstetric history: OB History  Gravida Para Term Preterm AB Living  4 3 3  0 0 3  SAB TAB Ectopic Multiple Live Births  0 0 0 0 3    # Outcome Date GA Lbr Len/2nd Weight Sex Delivery Anes PTL Lv  4 Current           3 Term 11/23/14 [redacted]w[redacted]d 07:07 / 00:11 7 lb 4.4 oz (3.3 kg) M Vag-Spont EPI  LIV  2 Term 12/31/11 [redacted]w[redacted]d 03:12  / 00:10 4 lb 9.2 oz (2.075 kg) F Vag-Spont EPI  LIV  1 Term 2012 [redacted]w[redacted]d 48:00 5 lb 6 oz (2.438 kg) F Vag-Spont EPI  LIV      Past Surgical History: Past Surgical History:  Procedure Laterality Date  . NO PAST SURGERIES      Family History: Family History  Problem Relation Age of Onset  . Alcohol abuse Neg Hx     Social History: Social History  Substance Use Topics  . Smoking status: Never Smoker  . Smokeless tobacco: Never Used  . Alcohol use No    Allergies: No Known Allergies  Meds:  Prescriptions Prior to Admission  Medication Sig Dispense Refill Last Dose  . miconazole (MONISTAT 7) 2 % vaginal cream Place 1 Applicatorful vaginally at bedtime.   04/06/2016 at Unknown time  . metroNIDAZOLE (FLAGYL) 500 MG tablet Take 1 tablet (500 mg total) by mouth 2 (two) times daily. (Patient not taking: Reported on 04/07/2016) 28 tablet 0 Not Taking at Unknown time  . pantoprazole (PROTONIX) 20 MG tablet Take 1 tablet (20 mg total) by mouth daily. (Patient not taking: Reported on 04/03/2016) 30 tablet 1 Not Taking    I have reviewed patient's Past Medical Hx, Surgical Hx, Family Hx, Social Hx, medications and allergies.   ROS:  Review of Systems  Constitutional: Negative for chills and fever.  Gastrointestinal: Negative for abdominal pain, constipation, diarrhea, nausea and vomiting.  Genitourinary: Positive for vaginal bleeding.  Negative for frequency and pelvic pain.   Other systems negative  Physical Exam  Patient Vitals for the past 24 hrs:  BP Temp Temp src Pulse Resp  04/07/16 1435 105/66 97.5 F (36.4 C) Oral 94 18   Constitutional: Well-developed, well-nourished female in no acute distress.  Cardiovascular: normal rate and rhythm Respiratory: normal effort, clear to auscultation bilaterally GI: Abd soft, non-tender, gravid appropriate for gestational age.   No rebound or guarding. MS: Extremities nontender, no edema, normal ROM Neurologic: Alert and oriented x 4.  GU:  Neg CVAT.  PELVIC EXAM: Cervix pink, visually closed, without lesion, scant pink/red discharge, vaginal walls and external genitalia normal    FHT:  Baseline 140 , moderate variability, accelerations present, no decelerations Contractions:  Rare   Labs: Results for orders placed or performed during the hospital encounter of 04/07/16 (from the past 24 hour(s))  Urinalysis, Routine w reflex microscopic     Status: Abnormal   Collection Time: 04/07/16  2:15 PM  Result Value Ref Range   Color, Urine AMBER (A) YELLOW   APPearance CLOUDY (A) CLEAR   Specific Gravity, Urine 1.025 1.005 - 1.030   pH 6.0 5.0 - 8.0   Glucose, UA NEGATIVE NEGATIVE mg/dL   Hgb urine dipstick NEGATIVE NEGATIVE   Bilirubin Urine NEGATIVE NEGATIVE   Ketones, ur NEGATIVE NEGATIVE mg/dL   Protein, ur NEGATIVE NEGATIVE mg/dL   Nitrite NEGATIVE NEGATIVE   Leukocytes, UA MODERATE (A) NEGATIVE   RBC / HPF 0-5 0 - 5 RBC/hpf   WBC, UA TOO NUMEROUS TO COUNT 0 - 5 WBC/hpf   Bacteria, UA RARE (A) NONE SEEN   Squamous Epithelial / LPF 6-30 (A) NONE SEEN   Mucous PRESENT     Imaging:  SIUP at 2977w6d Placenta anterior, no previa AFI 11.1cm Cervical length 2.8cm   MAU Course/MDM: I have ordered labs and reviewed results.  NST reviewed Consult Dr Debroah LoopArnold with presentation, exam findings and test results.   Siince she has a shortened cervix and spotting, suspect preterm contractions have caused this. Denies recent IC.  Cervix only somewhat shortened but will give Betamethasone series anyway.  STrict preterm labor precautions Treatments in MAU included Betamethasone.    Assessment: 1. Antepartum bleeding, third trimester   2.    Cervical effacement  Plan: Discharge home Repeat second dose of betamethasone tomorrow Preterm Labor precautions and fetal kick counts Follow up in Office for prenatal visits and recheck of bleeding  Encouraged to return here or to other Urgent Care/ED if she develops worsening of symptoms,  increase in pain, fever, or other concerning symptoms.    Pt stable at time of discharge.  Wynelle BourgeoisMarie Vesta Wheeland CNM, MSN Certified Nurse-Midwife 04/07/2016 2:55 PM

## 2016-04-07 NOTE — Discharge Instructions (Signed)
Preterm Labor and Birth Information °Pregnancy normally lasts 39-41 weeks. Preterm labor is when labor starts early. It starts before you have been pregnant for 37 whole weeks. °What are the risk factors for preterm labor? °Preterm labor is more likely to occur in women who: °· Have an infection while pregnant. °· Have a cervix that is short. °· Have gone into preterm labor before. °· Have had surgery on their cervix. °· Are younger than age 24. °· Are older than age 35. °· Are African American. °· Are pregnant with two or more babies. °· Take street drugs while pregnant. °· Smoke while pregnant. °· Do not gain enough weight while pregnant. °· Got pregnant right after another pregnancy. °What are the symptoms of preterm labor? °Symptoms of preterm labor include: °· Cramps. The cramps may feel like the cramps some women get during their period. The cramps may happen with watery poop (diarrhea). °· Pain in the belly (abdomen). °· Pain in the lower back. °· Regular contractions or tightening. It may feel like your belly is getting tighter. °· Pressure in the lower belly that seems to get stronger. °· More fluid (discharge) leaking from the vagina. The fluid may be watery or bloody. °· Water breaking. °Why is it important to notice signs of preterm labor? °Babies who are born early may not be fully developed. They have a higher chance for: °· Long-term heart problems. °· Long-term lung problems. °· Trouble controlling body systems, like breathing. °· Bleeding in the brain. °· A condition called cerebral palsy. °· Learning difficulties. °· Death. °These risks are highest for babies who are born before 34 weeks of pregnancy. °How is preterm labor treated? °Treatment depends on: °· How long you were pregnant. °· Your condition. °· The health of your baby. °Treatment may involve: °· Having a stitch (suture) placed in your cervix. When you give birth, your cervix opens so the baby can come out. The stitch keeps the cervix  from opening too soon. °· Staying at the hospital. °· Taking or getting medicines, such as: °¨ Hormone medicines. °¨ Medicines to stop contractions. °¨ Medicines to help the baby’s lungs develop. °¨ Medicines to prevent your baby from having cerebral palsy. °What should I do if I am in preterm labor? °If you think you are going into labor too soon, call your doctor right away. °How can I prevent preterm labor? °· Do not use any tobacco products. °¨ Examples of these are cigarettes, chewing tobacco, and e-cigarettes. °¨ If you need help quitting, ask your doctor. °· Do not use street drugs. °· Do not use any medicines unless you ask your doctor if they are safe for you. °· Talk with your doctor before taking any herbal supplements. °· Make sure you gain enough weight. °· Watch for infection. If you think you might have an infection, get it checked right away. °· If you have gone into preterm labor before, tell your doctor. °This information is not intended to replace advice given to you by your health care provider. Make sure you discuss any questions you have with your health care provider. °Document Released: 06/19/2008 Document Revised: 09/03/2015 Document Reviewed: 08/14/2015 °Elsevier Interactive Patient Education © 2017 Elsevier Inc. ° °

## 2016-04-08 LAB — NUSWAB VG+, CANDIDA 6SP
Candida albicans, NAA: POSITIVE — AB
Candida glabrata, NAA: NEGATIVE
Candida krusei, NAA: NEGATIVE
Candida lusitaniae, NAA: NEGATIVE
Candida parapsilosis, NAA: NEGATIVE
Candida tropicalis, NAA: NEGATIVE
Chlamydia trachomatis, NAA: NEGATIVE
Megasphaera 1: HIGH Score — AB
Neisseria gonorrhoeae, NAA: NEGATIVE
Trich vag by NAA: POSITIVE — AB

## 2016-04-09 ENCOUNTER — Other Ambulatory Visit: Payer: Self-pay | Admitting: Certified Nurse Midwife

## 2016-04-09 DIAGNOSIS — A599 Trichomoniasis, unspecified: Secondary | ICD-10-CM

## 2016-04-10 ENCOUNTER — Other Ambulatory Visit: Payer: Medicaid Other

## 2016-04-11 LAB — OBSTETRIC PANEL, INCLUDING HIV
Antibody Screen: NEGATIVE
Basophils Absolute: 0 10*3/uL (ref 0.0–0.2)
Basos: 0 %
EOS (ABSOLUTE): 0 10*3/uL (ref 0.0–0.4)
Eos: 1 %
HIV Screen 4th Generation wRfx: NONREACTIVE
Hematocrit: 33.1 % — ABNORMAL LOW (ref 34.0–46.6)
Hemoglobin: 10.6 g/dL — ABNORMAL LOW (ref 11.1–15.9)
Hepatitis B Surface Ag: NEGATIVE
Immature Grans (Abs): 0 10*3/uL (ref 0.0–0.1)
Immature Granulocytes: 0 %
Lymphocytes Absolute: 1.2 10*3/uL (ref 0.7–3.1)
Lymphs: 17 %
MCH: 28.7 pg (ref 26.6–33.0)
MCHC: 32 g/dL (ref 31.5–35.7)
MCV: 90 fL (ref 79–97)
Monocytes Absolute: 0.5 10*3/uL (ref 0.1–0.9)
Monocytes: 7 %
Neutrophils Absolute: 5.6 10*3/uL (ref 1.4–7.0)
Neutrophils: 75 %
Platelets: 245 10*3/uL (ref 150–379)
RBC: 3.69 x10E6/uL — ABNORMAL LOW (ref 3.77–5.28)
RDW: 13.7 % (ref 12.3–15.4)
RPR Ser Ql: NONREACTIVE
Rh Factor: POSITIVE
Rubella Antibodies, IGG: 1.54 index (ref >0.99)
WBC: 7.4 10*3/uL (ref 3.4–10.8)

## 2016-04-11 LAB — HEMOGLOBINOPATHY EVALUATION
HGB C: 0 %
HGB S: 0 %
HGB VARIANT: 0 %
Hemoglobin A2 Quantitation: 2.5 % (ref 1.8–3.2)
Hemoglobin F Quantitation: 0 % (ref 0.0–2.0)
Hgb A: 97.5 % (ref 96.4–98.8)

## 2016-04-11 LAB — HEMOGLOBIN A1C
Est. average glucose Bld gHb Est-mCnc: 100 mg/dL
Hgb A1c MFr Bld: 5.1 % (ref 4.8–5.6)

## 2016-04-11 LAB — TSH: TSH: 0.705 u[IU]/mL (ref 0.450–4.500)

## 2016-04-11 LAB — VARICELLA ZOSTER ANTIBODY, IGG: Varicella zoster IgG: 1287 index (ref >165)

## 2016-04-11 LAB — TOXASSURE SELECT 13 (MW), URINE

## 2016-04-11 LAB — CYSTIC FIBROSIS MUTATION 97: Interpretation: NOT DETECTED

## 2016-04-12 LAB — MATERNIT21 PLUS CORE+SCA
Chromosome 13: NEGATIVE
Chromosome 18: NEGATIVE
Chromosome 21: NEGATIVE
PDF: 0
Y Chromosome: DETECTED

## 2016-04-13 ENCOUNTER — Other Ambulatory Visit: Payer: Self-pay | Admitting: Certified Nurse Midwife

## 2016-04-13 DIAGNOSIS — Z3480 Encounter for supervision of other normal pregnancy, unspecified trimester: Secondary | ICD-10-CM

## 2016-04-13 DIAGNOSIS — F1210 Cannabis abuse, uncomplicated: Secondary | ICD-10-CM

## 2016-04-14 ENCOUNTER — Ambulatory Visit (HOSPITAL_COMMUNITY): Payer: Medicaid Other | Attending: Certified Nurse Midwife

## 2016-04-14 ENCOUNTER — Ambulatory Visit (HOSPITAL_COMMUNITY): Admission: RE | Admit: 2016-04-14 | Payer: Medicaid Other | Source: Ambulatory Visit

## 2016-04-15 ENCOUNTER — Ambulatory Visit (HOSPITAL_COMMUNITY): Payer: Medicaid Other

## 2016-04-20 ENCOUNTER — Encounter: Payer: Medicaid Other | Admitting: Certified Nurse Midwife

## 2016-04-28 ENCOUNTER — Inpatient Hospital Stay (HOSPITAL_COMMUNITY): Admission: RE | Admit: 2016-04-28 | Payer: Medicaid Other | Source: Ambulatory Visit

## 2016-04-28 ENCOUNTER — Ambulatory Visit (HOSPITAL_COMMUNITY): Admission: RE | Admit: 2016-04-28 | Payer: Medicaid Other | Source: Ambulatory Visit

## 2016-04-29 ENCOUNTER — Encounter: Payer: Medicaid Other | Admitting: Obstetrics & Gynecology

## 2016-05-08 ENCOUNTER — Inpatient Hospital Stay (HOSPITAL_COMMUNITY)
Admission: AD | Admit: 2016-05-08 | Discharge: 2016-05-08 | Disposition: A | Discharge status: Home / Self Care | Payer: Medicaid Other | Source: Ambulatory Visit | Attending: Obstetrics and Gynecology | Admitting: Obstetrics and Gynecology

## 2016-05-08 ENCOUNTER — Encounter (HOSPITAL_COMMUNITY): Payer: Self-pay

## 2016-05-08 DIAGNOSIS — O26893 Other specified pregnancy related conditions, third trimester: Secondary | ICD-10-CM | POA: Insufficient documentation

## 2016-05-08 DIAGNOSIS — Z3A34 34 weeks gestation of pregnancy: Secondary | ICD-10-CM | POA: Insufficient documentation

## 2016-05-08 DIAGNOSIS — A599 Trichomoniasis, unspecified: Secondary | ICD-10-CM | POA: Diagnosis not present

## 2016-05-08 DIAGNOSIS — O98313 Other infections with a predominantly sexual mode of transmission complicating pregnancy, third trimester: Secondary | ICD-10-CM | POA: Diagnosis not present

## 2016-05-08 DIAGNOSIS — R109 Unspecified abdominal pain: Secondary | ICD-10-CM | POA: Insufficient documentation

## 2016-05-08 DIAGNOSIS — N949 Unspecified condition associated with female genital organs and menstrual cycle: Secondary | ICD-10-CM

## 2016-05-08 LAB — URINALYSIS, ROUTINE W REFLEX MICROSCOPIC
Bilirubin Urine: NEGATIVE
Glucose, UA: NEGATIVE mg/dL
Hgb urine dipstick: NEGATIVE
Ketones, ur: NEGATIVE mg/dL
Nitrite: NEGATIVE
Protein, ur: NEGATIVE mg/dL
Specific Gravity, Urine: 1.005 (ref 1.005–1.030)
pH: 7 (ref 5.0–8.0)

## 2016-05-08 LAB — WET PREP, GENITAL
Clue Cells Wet Prep HPF POC: NONE SEEN
Sperm: NONE SEEN
Yeast Wet Prep HPF POC: NONE SEEN

## 2016-05-08 LAB — FETAL FIBRONECTIN: Fetal Fibronectin: POSITIVE — AB

## 2016-05-08 LAB — OB RESULTS CONSOLE GC/CHLAMYDIA: Gonorrhea: NEGATIVE

## 2016-05-08 MED ORDER — ONDANSETRON HCL 4 MG PO TABS
4.0000 mg | ORAL_TABLET | Freq: Once | ORAL | Status: AC
  Administered 2016-05-08: 4 mg via ORAL
  Filled 2016-05-08: qty 1

## 2016-05-08 MED ORDER — METRONIDAZOLE 500 MG PO TABS
2000.0000 mg | ORAL_TABLET | Freq: Once | ORAL | Status: AC
  Administered 2016-05-08: 2000 mg via ORAL
  Filled 2016-05-08: qty 4

## 2016-05-08 NOTE — MAU Provider Note (Signed)
History   Patient Bethany Gallagher is a 26 year old G4P3003 at 34 weeks and 2 days here with complaints of abdominal when she sits up and that starts contractions. She also has occasional contractions throughout the day (about 5/day) that she rates a 6/10. She did not complete her treatment for trich four weeks ago because the medicine made her sick. She has not had any vaginal spotting in 3 days.   CSN: [id number]  Arrival date and time: 05/08/16 1237   None     Chief Complaint  Patient presents with  . Abdominal Pain   Abdominal Pain  This is a new problem. The current episode started in the past 7 days. The onset quality is gradual. The problem occurs intermittently. The problem has been unchanged. The pain is located in the suprapubic region. The pain is at a severity of 8/10. The pain is severe. The quality of the pain is sharp and tearing. The abdominal pain does not radiate. Pertinent negatives include no anorexia, arthralgias, belching, constipation, diarrhea, dysuria, fever, flatus, frequency, headaches, hematochezia, hematuria, melena, myalgias, nausea, vomiting or weight loss. The pain is aggravated by certain positions. Relieved by: leaning foward helps it feel better. She has tried nothing for the symptoms. There is no history of abdominal surgery.  Vaginal Bleeding  The patient's pertinent negatives include no genital itching, genital lesions, genital odor, genital rash or vaginal discharge. Primary symptoms comment: last e(isode of spotting was on 1/30. This is a chronic problem. The current episode started more than 1 month ago. The problem occurs intermittently. Associated symptoms include abdominal pain. Pertinent negatives include no anorexia, constipation, diarrhea, dysuria, fever, frequency, headaches, hematuria, nausea or vomiting. She is not sexually active. There is no history of an abdominal surgery.    OB History    Gravida Para Term Preterm AB Living   4 3 3  0 0 3    SAB TAB Ectopic Multiple Live Births   0 0 0 0 3      Past Medical History:  Diagnosis Date  . Hx of chlamydia infection   . Hx of gonorrhea   . No pertinent past medical history   . Pregnancy induced hypertension     Past Surgical History:  Procedure Laterality Date  . NO PAST SURGERIES      Family History  Problem Relation Age of Onset  . Alcohol abuse Neg Hx     Social History  Substance Use Topics  . Smoking status: Never Smoker  . Smokeless tobacco: Never Used  . Alcohol use No    Allergies: No Known Allergies  Prescriptions Prior to Admission  Medication Sig Dispense Refill Last Dose  . metroNIDAZOLE (FLAGYL) 500 MG tablet Take 1 tablet (500 mg total) by mouth 2 (two) times daily. (Patient not taking: Reported on 04/07/2016) 28 tablet 0 Not Taking at Unknown time  . miconazole (MONISTAT 7) 2 % vaginal cream Place 1 Applicatorful vaginally at bedtime.   04/06/2016 at Unknown time  . pantoprazole (PROTONIX) 20 MG tablet Take 1 tablet (20 mg total) by mouth daily. (Patient not taking: Reported on 04/03/2016) 30 tablet 1 Not Taking    Review of Systems  Constitutional: Negative for fever and weight loss.  HENT: Negative.   Eyes: Negative.   Respiratory: Negative.   Gastrointestinal: Positive for abdominal pain. Negative for anorexia, constipation, diarrhea, flatus, hematochezia, melena, nausea and vomiting.  Endocrine: Negative.   Genitourinary: Positive for vaginal bleeding. Negative for dysuria, frequency, hematuria and vaginal  discharge.  Musculoskeletal: Negative for arthralgias and myalgias.  Skin: Negative.   Allergic/Immunologic: Negative.   Neurological: Negative for headaches.  Psychiatric/Behavioral: Negative.    Physical Exam   Blood pressure 112/67, pulse 95, temperature 97.2 F (36.2 C), temperature source Oral, resp. rate 16, height 5\' 6"  (1.676 m), weight 136 lb (61.7 kg), last menstrual period 09/11/2015, unknown if currently  breastfeeding.  Physical Exam  Constitutional: She is oriented to person, place, and time. She appears well-developed.  HENT:  Head: Normocephalic.  Neck: Normal range of motion.  Cardiovascular: Normal rate.   Respiratory: Effort normal.  GI: Soft. Bowel sounds are normal. She exhibits no distension and no mass. There is no tenderness. There is no rebound and no guarding.  Genitourinary:  Genitourinary Comments: NEFG, no lesions. Vaginal walls pink with no lesions. Cervix is pink with no lesions, no vaginal discharge or bleeding. Cervix is 1 cm, 60%. No CMT. No suprapubic pressure.   Musculoskeletal: Normal range of motion.  Neurological: She is alert and oriented to person, place, and time.  Skin: Skin is warm and dry.  Psychiatric: She has a normal mood and affect.    MAU Course  Procedures  MDM -UA-no signs of UTI -GC CT-pending -fetal fibronectin-positive, but patient is 34 weeks and 2 days -NST: 130, moderate variability, positive accelerations, no decelerations and no contractions.  -wet prep: positive for trich. Patient received 2 grams of flagyl in the MAU and RX for partner Bethany Gallagher given  Assessment and Plan   1. Trichomonosis   2. Round Ligament pain   2. D/C patient home with recommendations to purchase a pregnancy support belt.  3. Discussed importance of partner treatment and pelvic rest; patient verbalized understanding.   4. Reviewed signs and symptoms of when to return to the MAU (increased contractions, increased bleeding, leaking of fluid, decreased fetal movement).    Charlesetta Garibaldi Nichael Ehly CNM 05/08/2016, 1:22 PM

## 2016-05-08 NOTE — MAU Note (Signed)
Pt C/O has lower abd pain pain "feels like something is being pulled" with movement, changing positions for the past 2 days.  Has had issues with spotting, none today.  No LOF.

## 2016-05-08 NOTE — Discharge Instructions (Signed)
Trichomoniasis °Trichomoniasis is an infection caused by an organism called Trichomonas. The infection can affect both women and men. In women, the outer female genitalia and the vagina are affected. In men, the penis is mainly affected, but the prostate and other reproductive organs can also be involved. Trichomoniasis is a sexually transmitted infection (STI) and is most often passed to another person through sexual contact.  °RISK FACTORS °Having unprotected sexual intercourse. °Having sexual intercourse with an infected partner. °SIGNS AND SYMPTOMS  °Symptoms of trichomoniasis in women include: °Abnormal gray-green frothy vaginal discharge. °Itching and irritation of the vagina. °Itching and irritation of the area outside the vagina. °Symptoms of trichomoniasis in men include:  °Penile discharge with or without pain. °Pain during urination. This results from inflammation of the urethra. °DIAGNOSIS  °Trichomoniasis may be found during a Pap test or physical exam. Your health care provider may use one of the following methods to help diagnose this infection: °Testing the pH of the vagina with a test tape. °Using a vaginal swab test that checks for the Trichomonas organism. A test is available that provides results within a few minutes. °Examining a urine sample. °Testing vaginal secretions. °Your health care provider may test you for other STIs, including HIV. °TREATMENT  °You may be given medicine to fight the infection. Women should inform their health care provider if they could be or are pregnant. Some medicines used to treat the infection should not be taken during pregnancy. °Your health care provider may recommend over-the-counter medicines or creams to decrease itching or irritation. °Your sexual partner will need to be treated if infected. °Your health care provider may test you for infection again 3 months after treatment. °HOME CARE INSTRUCTIONS  °Take medicines only as directed by your health care  provider. °Take over-the-counter medicine for itching or irritation as directed by your health care provider. °Do not have sexual intercourse while you have the infection. °Women should not douche or wear tampons while they have the infection. °Discuss your infection with your partner. Your partner may have gotten the infection from you, or you may have gotten it from your partner. °Have your sex partner get examined and treated if necessary. °Practice safe, informed, and protected sex. °See your health care provider for other STI testing. °SEEK MEDICAL CARE IF:  °You still have symptoms after you finish your medicine. °You develop abdominal pain. °You have pain when you urinate. °You have bleeding after sexual intercourse. °You develop a rash. °Your medicine makes you sick or makes you throw up (vomit). °MAKE SURE YOU: °Understand these instructions. °Will watch your condition. °Will get help right away if you are not doing well or get worse. °This information is not intended to replace advice given to you by your health care provider. Make sure you discuss any questions you have with your health care provider. °Document Released: 09/16/2000 Document Revised: 04/13/2014 Document Reviewed: 01/02/2013 °Elsevier Interactive Patient Education © 2017 Elsevier Inc. °  °

## 2016-05-08 NOTE — MAU Note (Addendum)
Pt fell 3 or 4 days ago. Pain started a day after the fall.

## 2016-05-11 LAB — GC/CHLAMYDIA PROBE AMP (~~LOC~~) NOT AT ARMC
Chlamydia: NEGATIVE
Neisseria Gonorrhea: NEGATIVE

## 2016-05-13 ENCOUNTER — Encounter: Payer: Medicaid Other | Admitting: Certified Nurse Midwife

## 2016-05-29 ENCOUNTER — Ambulatory Visit (HOSPITAL_COMMUNITY): Admission: RE | Admit: 2016-05-29 | Payer: Medicaid Other | Source: Ambulatory Visit

## 2016-05-29 ENCOUNTER — Ambulatory Visit (HOSPITAL_COMMUNITY): Payer: Medicaid Other | Attending: Certified Nurse Midwife

## 2016-06-05 ENCOUNTER — Inpatient Hospital Stay (HOSPITAL_COMMUNITY)
Admission: AD | Admit: 2016-06-05 | Discharge: 2016-06-05 | Disposition: A | Discharge status: Home / Self Care | Payer: Medicaid Other | Source: Ambulatory Visit | Attending: Obstetrics and Gynecology | Admitting: Obstetrics and Gynecology

## 2016-06-05 ENCOUNTER — Encounter (HOSPITAL_COMMUNITY): Payer: Self-pay | Admitting: *Deleted

## 2016-06-05 DIAGNOSIS — O98313 Other infections with a predominantly sexual mode of transmission complicating pregnancy, third trimester: Secondary | ICD-10-CM | POA: Insufficient documentation

## 2016-06-05 DIAGNOSIS — O23593 Infection of other part of genital tract in pregnancy, third trimester: Secondary | ICD-10-CM | POA: Diagnosis not present

## 2016-06-05 DIAGNOSIS — O26893 Other specified pregnancy related conditions, third trimester: Secondary | ICD-10-CM

## 2016-06-05 DIAGNOSIS — N898 Other specified noninflammatory disorders of vagina: Secondary | ICD-10-CM

## 2016-06-05 DIAGNOSIS — A5901 Trichomonal vulvovaginitis: Secondary | ICD-10-CM | POA: Diagnosis not present

## 2016-06-05 DIAGNOSIS — Z87891 Personal history of nicotine dependence: Secondary | ICD-10-CM | POA: Insufficient documentation

## 2016-06-05 DIAGNOSIS — Z3A38 38 weeks gestation of pregnancy: Secondary | ICD-10-CM | POA: Insufficient documentation

## 2016-06-05 LAB — POCT FERN TEST: POCT Fern Test: NEGATIVE

## 2016-06-05 LAB — WET PREP, GENITAL
Clue Cells Wet Prep HPF POC: NONE SEEN
Sperm: NONE SEEN
Yeast Wet Prep HPF POC: NONE SEEN

## 2016-06-05 MED ORDER — METRONIDAZOLE 500 MG PO TABS
2000.0000 mg | ORAL_TABLET | Freq: Once | ORAL | Status: AC
  Administered 2016-06-05: 2000 mg via ORAL
  Filled 2016-06-05: qty 4

## 2016-06-05 NOTE — MAU Note (Signed)
Thinks her waterbag is leaking.  Went to the bathroom and her drawers are really really wet, pantiliner is wet.  Clear fluid, no odor. Irregular ctxs, about every .

## 2016-06-05 NOTE — Discharge Instructions (Signed)
Trichomonas Test Why am I having this test? The trichomonas test is done to diagnose trichomoniasis, an infection caused by an organism called Trichomonas. Trichomoniasis is a sexually transmitted infection (STI). In women, it causes vaginal infections. In men, it can cause the tube that carries urine (urethra) to become inflamed (urethritis). You may have this test as a part of a routine screening for STIs or if you have symptoms of trichomoniasis. To perform the test, your health care provider will take a sample of discharge. The sample is taken from the vagina or cervix in women and from the urethra in men. A urine sample can also be used for testing. What do the results mean? It is your responsibility to obtain your test results. Ask the lab or department performing the test when and how you will get your results. Contact your health care provider to discuss any questions you have about your results. Negative test results A negative test means you do not have trichomoniasis. Follow your health care provider's directions about any follow-up testing. Positive test results A positive test result means you have an active infection that needs to be treated with antibiotic medicine. All your current sexual partners must also be treated or it is likely you will get reinfected. If your test is positive, your health care provider will start you on medicine and may advise you to:  Not have sexual intercourse until your infection has cleared up.  Use a latex condom properly every time you have sexual intercourse.  Limit the number of sexual partners you have. The more partners you have, the greater your risk of contracting trichomoniasis or another STI.  Tell all sexual partners about your infection so they can also be treated and to prevent reinfection.  Talk with your health care provider to discuss your results, treatment options, and if necessary, the need for more tests. Talk with your health care  provider if you have any questions about your results. This information is not intended to replace advice given to you by your health care provider. Make sure you discuss any questions you have with your health care provider. Document Released: 04/25/2004 Document Revised: 10/23/2015 Document Reviewed: 04/04/2013 Elsevier Interactive Patient Education  2017 [employer]  

## 2016-06-05 NOTE — MAU Provider Note (Signed)
Chief Complaint:  possible SROM   First Provider Initiated Contact with Patient 06/05/16 1413     HPI: Bethany Gallagher is a 26 y.o. G4P3003 at 6160w2dwho presents to maternity admissions reporting possible leaking of water.  Had wet clothes, but now dry. . She reports good fetal movement, denies LOF, vaginal bleeding, vaginal itching/burning, urinary symptoms, h/a, dizziness, n/v, diarrhea, constipation or fever/chills.  She denies headache, visual changes or RUQ abdominal pain.  Vaginal Discharge  The patient's primary symptoms include vaginal discharge. The patient's pertinent negatives include no genital itching, genital lesions, genital odor or pelvic pain. This is a new problem. The current episode started today. The problem occurs intermittently. The problem has been gradually improving. The pain is mild. She is pregnant. Pertinent negatives include no back pain, chills, constipation, diarrhea, dysuria, fever, nausea or vomiting. The vaginal discharge was clear and watery. There has been no bleeding. She has not been passing clots. She has not been passing tissue. Nothing aggravates the symptoms. She has tried nothing for the symptoms.    RN Note: Thinks her waterbag is leaking.  Went to the bathroom and her drawers are really really wet, pantiliner is wet.  Clear fluid, no odor. Irregular ctxs, about every 45min.  Past Medical History: Past Medical History:  Diagnosis Date  . Hx of chlamydia infection   . Hx of gonorrhea   . No pertinent past medical history   . Pregnancy induced hypertension     Past obstetric history: OB History  Gravida Para Term Preterm AB Living  4 3 3  0 0 3  SAB TAB Ectopic Multiple Live Births  0 0 0 0 3    # Outcome Date GA Lbr Len/2nd Weight Sex Delivery Anes PTL Lv  4 Current           3 Term 11/23/14 3960w2d 07:07 / 00:11 7 lb 4.4 oz (3.3 kg) M Vag-Spont EPI  LIV  2 Term 12/31/11 5533w4d 03:12 / 00:10 4 lb 9.2 oz (2.075 kg) F Vag-Spont EPI  LIV  1  Term 2012 7149w0d 48:00 5 lb 6 oz (2.438 kg) F Vag-Spont EPI  LIV      Past Surgical History: Past Surgical History:  Procedure Laterality Date  . NO PAST SURGERIES      Family History: Family History  Problem Relation Age of Onset  . Cancer Maternal Grandfather   . Alcohol abuse Neg Hx   . Asthma Neg Hx   . Diabetes Neg Hx   . Heart disease Neg Hx   . Hypertension Neg Hx   . Stroke Neg Hx     Social History: Social History  Substance Use Topics  . Smoking status: Former Smoker    Types: Cigarettes  . Smokeless tobacco: Never Used     Comment: with + preg test  . Alcohol use No    Allergies: No Known Allergies  Meds:  Prescriptions Prior to Admission  Medication Sig Dispense Refill Last Dose  . Prenatal Vit-Fe Fumarate-FA (PRENATAL MULTIVITAMIN) TABS tablet Take 1 tablet by mouth daily at 12 noon.   06/05/2016 at Unknown time    I have reviewed patient's Past Medical Hx, Surgical Hx, Family Hx, Social Hx, medications and allergies.   ROS:  Review of Systems  Constitutional: Negative for chills and fever.  Gastrointestinal: Negative for constipation, diarrhea, nausea and vomiting.  Genitourinary: Positive for vaginal discharge. Negative for dysuria and pelvic pain.  Musculoskeletal: Negative for back pain.   Other systems negative  Physical Exam  Patient Vitals for the past 24 hrs:  BP Temp Temp src Pulse Resp SpO2 Weight  06/05/16 1411 105/62 - - 92 17 - -  06/05/16 1330 106/69 - - 95 - - -  06/05/16 1300 107/67 98.2 F (36.8 C) Oral 99 16 100 % 140 lb 4 oz (63.6 kg)   Constitutional: Well-developed, well-nourished female in no acute distress.  Cardiovascular: normal rate and rhythm Respiratory: normal effort, clear to auscultation bilaterally GI: Abd soft, non-tender, gravid appropriate for gestational age.   No rebound or guarding. MS: Extremities nontender, no edema, normal ROM Neurologic: Alert and oriented x 4.  GU: Neg CVAT.  PELVIC EXAM: Cervix  pink, visually closed, without lesion, scant white creamy discharge, No pooling or ferning. vaginal walls and external genitalia normal  Bimanual exam: Cervix firm, posterior, neg CMT, uterus nontender, Fundal Height consistent with dates, adnexa without tenderness, enlargement, or mass     FHT:  Baseline 140 , moderate variability, accelerations present, no decelerations Contractions: Irregular     Labs: Results for orders placed or performed during the hospital encounter of 06/05/16 (from the past 24 hour(s))  POCT fern test     Status: None   Collection Time: 06/05/16  2:12 PM  Result Value Ref Range   POCT Fern Test Negative = intact amniotic membranes    O/Positive/-- (12/29 1214)   Ref. Range 06/05/2016 14:20  Yeast Wet Prep HPF POC Latest Ref Range: NONE SEEN  NONE SEEN  Trich, Wet Prep Latest Ref Range: NONE SEEN  PRESENT (A)  Clue Cells Wet Prep HPF POC Latest Ref Range: NONE SEEN  NONE SEEN  WBC, Wet Prep HPF POC Latest Ref Range: NONE SEEN  MANY (A)   Imaging:  No results found.  MAU Course/MDM: I have ordered labs and reviewed results.  NST reviewed and found to be reactive, Category I Informed her of + Trichomonas, treated  Assessment: Single IUP at [redacted]w[redacted]d Vaginal discharge, no evidence of ruptured membranes Trichomonal vaginitis  Plan: Discharge home Labor precautions and fetal kick counts Follow up in Office for prenatal visits and recheck of status   Pt stable at time of discharge.  Wynelle Bourgeois CNM, MSN Certified Nurse-Midwife 06/05/2016 2:13 PM

## 2016-06-08 ENCOUNTER — Inpatient Hospital Stay (HOSPITAL_COMMUNITY)
Admission: AD | Admit: 2016-06-08 | Discharge: 2016-06-10 | DRG: 775 | Disposition: A | Discharge status: Home / Self Care | Payer: Medicaid Other | Source: Ambulatory Visit | Attending: Family Medicine | Admitting: Family Medicine

## 2016-06-08 ENCOUNTER — Encounter (HOSPITAL_COMMUNITY): Payer: Self-pay

## 2016-06-08 ENCOUNTER — Inpatient Hospital Stay (HOSPITAL_COMMUNITY): Payer: Medicaid Other | Admitting: Anesthesiology

## 2016-06-08 DIAGNOSIS — Z87891 Personal history of nicotine dependence: Secondary | ICD-10-CM

## 2016-06-08 DIAGNOSIS — F1210 Cannabis abuse, uncomplicated: Secondary | ICD-10-CM | POA: Diagnosis present

## 2016-06-08 DIAGNOSIS — Z3A38 38 weeks gestation of pregnancy: Secondary | ICD-10-CM

## 2016-06-08 DIAGNOSIS — O99324 Drug use complicating childbirth: Principal | ICD-10-CM | POA: Diagnosis present

## 2016-06-08 DIAGNOSIS — Z3493 Encounter for supervision of normal pregnancy, unspecified, third trimester: Secondary | ICD-10-CM | POA: Diagnosis present

## 2016-06-08 LAB — CBC
HCT: 30.9 % — ABNORMAL LOW (ref 36.0–46.0)
Hemoglobin: 10 g/dL — ABNORMAL LOW (ref 12.0–15.0)
MCH: 27.4 pg (ref 26.0–34.0)
MCHC: 32.4 g/dL (ref 30.0–36.0)
MCV: 84.7 fL (ref 78.0–100.0)
Platelets: 251 10*3/uL (ref 150–400)
RBC: 3.65 MIL/uL — ABNORMAL LOW (ref 3.87–5.11)
RDW: 14.3 % (ref 11.5–15.5)
WBC: 8.3 10*3/uL (ref 4.0–10.5)

## 2016-06-08 LAB — OB RESULTS CONSOLE GBS: GBS: NEGATIVE

## 2016-06-08 LAB — GROUP B STREP BY PCR: Group B strep by PCR: NEGATIVE

## 2016-06-08 LAB — TYPE AND SCREEN
ABO/RH(D): O POS
Antibody Screen: NEGATIVE

## 2016-06-08 MED ORDER — OXYTOCIN BOLUS FROM INFUSION
500.0000 mL | Freq: Once | INTRAVENOUS | Status: AC
  Administered 2016-06-09: 500 mL via INTRAVENOUS

## 2016-06-08 MED ORDER — LACTATED RINGERS IV SOLN
INTRAVENOUS | Status: DC
  Administered 2016-06-08: 22:00:00 via INTRAVENOUS

## 2016-06-08 MED ORDER — PHENYLEPHRINE 40 MCG/ML (10ML) SYRINGE FOR IV PUSH (FOR BLOOD PRESSURE SUPPORT)
80.0000 ug | PREFILLED_SYRINGE | INTRAVENOUS | Status: DC | PRN
  Filled 2016-06-08: qty 5

## 2016-06-08 MED ORDER — LIDOCAINE HCL (PF) 1 % IJ SOLN
INTRAMUSCULAR | Status: DC | PRN
  Administered 2016-06-08 (×2): 6 mL via EPIDURAL

## 2016-06-08 MED ORDER — DIPHENHYDRAMINE HCL 50 MG/ML IJ SOLN: 12.5000 mg | INTRAMUSCULAR | Status: DC | PRN

## 2016-06-08 MED ORDER — FENTANYL 2.5 MCG/ML BUPIVACAINE 1/10 % EPIDURAL INFUSION (WH - ANES)
14.0000 mL/h | INTRAMUSCULAR | Status: DC | PRN
  Administered 2016-06-08: 14 mL/h via EPIDURAL
  Filled 2016-06-08: qty 100

## 2016-06-08 MED ORDER — OXYTOCIN 40 UNITS IN LACTATED RINGERS INFUSION - SIMPLE MED: 1.0000 m[IU]/min | INTRAVENOUS | Status: DC

## 2016-06-08 MED ORDER — EPHEDRINE 5 MG/ML INJ
10.0000 mg | INTRAVENOUS | Status: DC | PRN
  Filled 2016-06-08: qty 4

## 2016-06-08 MED ORDER — LACTATED RINGERS IV SOLN
500.0000 mL | Freq: Once | INTRAVENOUS | Status: AC
  Administered 2016-06-08: 500 mL via INTRAVENOUS

## 2016-06-08 MED ORDER — LACTATED RINGERS IV SOLN: 500.0000 mL | INTRAVENOUS | Status: DC | PRN

## 2016-06-08 MED ORDER — FLEET ENEMA 7-19 GM/118ML RE ENEM: 1.0000 | ENEMA | RECTAL | Status: DC | PRN

## 2016-06-08 MED ORDER — ACETAMINOPHEN 325 MG PO TABS: 650.0000 mg | ORAL_TABLET | ORAL | Status: DC | PRN

## 2016-06-08 MED ORDER — TERBUTALINE SULFATE 1 MG/ML IJ SOLN
0.2500 mg | Freq: Once | INTRAMUSCULAR | Status: DC | PRN
  Filled 2016-06-08: qty 1

## 2016-06-08 MED ORDER — OXYCODONE-ACETAMINOPHEN 5-325 MG PO TABS: 1.0000 | ORAL_TABLET | ORAL | Status: DC | PRN

## 2016-06-08 MED ORDER — ONDANSETRON HCL 4 MG/2ML IJ SOLN: 4.0000 mg | Freq: Four times a day (QID) | INTRAMUSCULAR | Status: DC | PRN

## 2016-06-08 MED ORDER — LIDOCAINE HCL (PF) 1 % IJ SOLN
30.0000 mL | INTRAMUSCULAR | Status: DC | PRN
  Filled 2016-06-08: qty 30

## 2016-06-08 MED ORDER — SOD CITRATE-CITRIC ACID 500-334 MG/5ML PO SOLN: 30.0000 mL | ORAL | Status: DC | PRN

## 2016-06-08 MED ORDER — FENTANYL CITRATE (PF) 100 MCG/2ML IJ SOLN: 50.0000 ug | INTRAMUSCULAR | Status: DC | PRN

## 2016-06-08 MED ORDER — PHENYLEPHRINE 40 MCG/ML (10ML) SYRINGE FOR IV PUSH (FOR BLOOD PRESSURE SUPPORT)
80.0000 ug | PREFILLED_SYRINGE | INTRAVENOUS | Status: DC | PRN
  Filled 2016-06-08: qty 5
  Filled 2016-06-08: qty 10

## 2016-06-08 MED ORDER — OXYTOCIN 40 UNITS IN LACTATED RINGERS INFUSION - SIMPLE MED
2.5000 [IU]/h | INTRAVENOUS | Status: DC
  Filled 2016-06-08: qty 1000

## 2016-06-08 MED ORDER — OXYCODONE-ACETAMINOPHEN 5-325 MG PO TABS: 2.0000 | ORAL_TABLET | ORAL | Status: DC | PRN

## 2016-06-08 NOTE — MAU Note (Signed)
Pt reports contractions every 5-10 mins. Denies LOF or vaginal bleeding. +FM. Cervix was 1.5 cm on last exam.

## 2016-06-08 NOTE — H&P (Signed)
LABOR AND DELIVERY ADMISSION HISTORY AND PHYSICAL NOTE  Bethany Gallagher is a 26 y.o. female [phone] with IUP at [redacted]w[redacted]d by LMP presenting for SOL since 1600 yesterday afternoon. She reports positive fetal movement. She denies leakage of fluid or vaginal bleeding. Limited prenatal care prior to 29 weeks. Says she was treated for trichomonas but no TOC per chart review. Also had some occasional vaginal bleeding throughout pregnancy.  Prenatal History/Complications: Late prenatal care Trichomoniasis during pregnancy  Past Medical History: Past Medical History:  Diagnosis Date  . Hx of chlamydia infection   . Hx of gonorrhea   . No pertinent past medical history   . Pregnancy induced hypertension     Past Surgical History: Past Surgical History:  Procedure Laterality Date  . NO PAST SURGERIES      Obstetrical History: OB History    Gravida Para Term Preterm AB Living   4 3 3  0 0 3   SAB TAB Ectopic Multiple Live Births   0 0 0 0 3      Social History: Social History   Social History  . Marital status: Single    Spouse name: N/A  . Number of children: N/A  . Years of education: N/A   Social History Main Topics  . Smoking status: Former Smoker    Types: Cigarettes  . Smokeless tobacco: Never Used     Comment: with + preg test  . Alcohol use No  . Drug use: No  . Sexual activity: Yes   Other Topics Concern  . None   Social History Narrative  . None    Family History: Family History  Problem Relation Age of Onset  . Cancer Maternal Grandfather   . Alcohol abuse Neg Hx   . Asthma Neg Hx   . Diabetes Neg Hx   . Heart disease Neg Hx   . Hypertension Neg Hx   . Stroke Neg Hx     Allergies: No Known Allergies  Prescriptions Prior to Admission  Medication Sig Dispense Refill Last Dose  . Prenatal Vit-Fe Fumarate-FA (PRENATAL MULTIVITAMIN) TABS tablet Take 1 tablet by mouth daily at 12 noon.   06/08/2016 at Unknown time     Review of Systems   All  systems reviewed and negative except as stated in HPI  Blood pressure 116/66, pulse 92, temperature 98.4 F (36.9 C), resp. rate 16, last menstrual period 09/11/2015, unknown if currently breastfeeding. General appearance: alert, cooperative and no distress Lungs: clear to auscultation bilaterally Heart: regular rate and rhythm Abdomen: soft, non-tender; bowel sounds normal Extremities: No calf swelling or tenderness Presentation: cephalic Fetal monitoring: FHR 130 bpm, moderate variability, accelerations present, no decelerations Uterine activity: regular q3-4 mins Dilation: 4.5 Effacement (%): 80 Station: -2, -1 Exam by:: Ginnie Smart RN   Prenatal labs: ABO, Rh: O/Positive/-- (12/29 1214) Antibody: Negative (12/29 1214) Rubella: Immune RPR: Non Reactive (12/29 1214)  HBsAg: Negative (12/29 1214)  HIV: Non Reactive (12/29 1214)  GBS: Unknown, pending PCR 1 hr Glucola: Not performed, late prenatal care Genetic screening:  Not performed, late prenatal care Anatomy US: Normal  Prenatal Transfer Tool  Maternal Diabetes: Not performed, late prenatal care Genetic Screening: Not performed, late prenatal care Maternal Ultrasounds/Referrals: Normal Fetal Ultrasounds or other Referrals:  None Maternal Substance Abuse:  Yes:  Type: Marijuana Significant Maternal Medications:  None Significant Maternal Lab Results: Lab values include: Other: GBS unknown  No results found for this or any previous visit (from the past 24 hour(s)).  Patient Active Problem List   Diagnosis Date Noted  . Mild tetrahydrocannabinol (THC) abuse 04/13/2016  . Supervision of other normal pregnancy, antepartum 04/03/2016  . Trichomonosis 06/29/2014  . Late prenatal care affecting pregnancy in third trimester 06/29/2014    Assessment: Bethany Gallagher is a 26 y.o. G4P3003 at 687w5d here for SOL.  #Labor:SOL, titrate pitocin to achieve adequate labor #Pain: IV pain meds, has epidural upon  request #FWB: Category I #ID:  GBS PCR pending #MOF: Breast #MOC:Undecided #Circ:  Undecided  Wendee Beaversavid J McMullen, DO, PGY-1 06/08/2016, 10:05 PM The patient was seen and examined by me also Agree with note NST reactive and reassuring UCs as listed Cervical exams as listed in note  Aviva SignsMarie L Acy Orsak, CNM

## 2016-06-08 NOTE — Anesthesia Preprocedure Evaluation (Signed)
Anesthesia Evaluation  Patient identified by MRN, date of birth, ID band Patient awake    Reviewed: Allergy & Precautions, H&P , NPO status , Patient's Chart, lab work & pertinent test results  Airway Mallampati: I  TM Distance: >3 FB Neck ROM: full    Dental no notable dental hx.    Pulmonary neg pulmonary ROS, former smoker,    Pulmonary exam normal        Cardiovascular Normal cardiovascular exam     Neuro/Psych negative neurological ROS  negative psych ROS   GI/Hepatic negative GI ROS, Neg liver ROS,   Endo/Other  negative endocrine ROS  Renal/GU negative Renal ROS  negative genitourinary   Musculoskeletal negative musculoskeletal ROS (+)   Abdominal Normal abdominal exam  (+)   Peds negative pediatric ROS (+)  Hematology negative hematology ROS (+)   Anesthesia Other Findings   Reproductive/Obstetrics (+) Pregnancy                             Anesthesia Physical  Anesthesia Plan  ASA: II  Anesthesia Plan: Epidural   Post-op Pain Management:    Induction:   Airway Management Planned:   Additional Equipment:   Intra-op Plan:   Post-operative Plan:   Informed Consent: I have reviewed the patients History and Physical, chart, labs and discussed the procedure including the risks, benefits and alternatives for the proposed anesthesia with the patient or authorized representative who has indicated his/her understanding and acceptance.     Plan Discussed with:   Anesthesia Plan Comments:         Anesthesia Quick Evaluation

## 2016-06-08 NOTE — Anesthesia Procedure Notes (Signed)
Epidural Patient location during procedure: OB Start time: 06/08/2016 11:11 PM End time: 06/08/2016 11:14 PM  Staffing Anesthesiologist: Leilani AbleHATCHETT, Daney Moor Performed: anesthesiologist   Preanesthetic Checklist Completed: patient identified, surgical consent, pre-op evaluation, timeout performed, IV checked, risks and benefits discussed and monitors and equipment checked  Epidural Patient position: sitting Prep: site prepped and draped and DuraPrep Patient monitoring: continuous pulse ox and blood pressure Approach: midline Location: L3-L4 Injection technique: LOR air  Needle:  Needle type: Tuohy  Needle gauge: 17 G Needle length: 9 cm and 9 Needle insertion depth: 5 cm cm Catheter type: closed end flexible Catheter size: 19 Gauge Catheter at skin depth: 10 cm Test dose: negative and Other  Assessment Sensory level: T9 Events: blood not aspirated, injection not painful, no injection resistance, negative IV test and no paresthesia  Additional Notes Reason for block:procedure for pain

## 2016-06-09 ENCOUNTER — Encounter (HOSPITAL_COMMUNITY): Payer: Self-pay | Admitting: *Deleted

## 2016-06-09 DIAGNOSIS — Z3A38 38 weeks gestation of pregnancy: Secondary | ICD-10-CM

## 2016-06-09 MED ORDER — WITCH HAZEL-GLYCERIN EX PADS: 1.0000 "application " | MEDICATED_PAD | CUTANEOUS | Status: DC | PRN

## 2016-06-09 MED ORDER — SIMETHICONE 80 MG PO CHEW: 80.0000 mg | CHEWABLE_TABLET | ORAL | Status: DC | PRN

## 2016-06-09 MED ORDER — ZOLPIDEM TARTRATE 5 MG PO TABS: 5.0000 mg | ORAL_TABLET | Freq: Every evening | ORAL | Status: DC | PRN

## 2016-06-09 MED ORDER — PRENATAL MULTIVITAMIN CH
1.0000 | ORAL_TABLET | Freq: Every day | ORAL | Status: DC
  Administered 2016-06-09 – 2016-06-10 (×2): 1 via ORAL
  Filled 2016-06-09 (×2): qty 1

## 2016-06-09 MED ORDER — COCONUT OIL OIL: 1.0000 "application " | TOPICAL_OIL | Status: DC | PRN

## 2016-06-09 MED ORDER — DIBUCAINE 1 % RE OINT: 1.0000 "application " | TOPICAL_OINTMENT | RECTAL | Status: DC | PRN

## 2016-06-09 MED ORDER — IBUPROFEN 600 MG PO TABS
600.0000 mg | ORAL_TABLET | Freq: Four times a day (QID) | ORAL | Status: DC
  Administered 2016-06-09 – 2016-06-10 (×6): 600 mg via ORAL
  Filled 2016-06-09 (×6): qty 1

## 2016-06-09 MED ORDER — ONDANSETRON HCL 4 MG PO TABS: 4.0000 mg | ORAL_TABLET | ORAL | Status: DC | PRN

## 2016-06-09 MED ORDER — ONDANSETRON HCL 4 MG/2ML IJ SOLN: 4.0000 mg | INTRAMUSCULAR | Status: DC | PRN

## 2016-06-09 MED ORDER — DIPHENHYDRAMINE HCL 25 MG PO CAPS: 25.0000 mg | ORAL_CAPSULE | Freq: Four times a day (QID) | ORAL | Status: DC | PRN

## 2016-06-09 MED ORDER — BENZOCAINE-MENTHOL 20-0.5 % EX AERO: 1.0000 "application " | INHALATION_SPRAY | CUTANEOUS | Status: DC | PRN

## 2016-06-09 MED ORDER — TETANUS-DIPHTH-ACELL PERTUSSIS 5-2.5-18.5 LF-MCG/0.5 IM SUSP: 0.5000 mL | Freq: Once | INTRAMUSCULAR | Status: DC

## 2016-06-09 MED ORDER — ACETAMINOPHEN 325 MG PO TABS
650.0000 mg | ORAL_TABLET | ORAL | Status: DC | PRN
  Administered 2016-06-09: 650 mg via ORAL
  Filled 2016-06-09: qty 2

## 2016-06-09 MED ORDER — SENNOSIDES-DOCUSATE SODIUM 8.6-50 MG PO TABS
2.0000 | ORAL_TABLET | ORAL | Status: DC
  Administered 2016-06-09: 2 via ORAL
  Filled 2016-06-09: qty 2

## 2016-06-09 NOTE — Progress Notes (Signed)
Post Partum Day #0 Subjective: no complaints, up ad lib, voiding and tolerating PO  Objective: Blood pressure 117/66, pulse 83, temperature 98.6 F (37 C), temperature source Oral, resp. rate 18, last menstrual period 09/11/2015, SpO2 99 %, unknown if currently breastfeeding.  Physical Exam:  General: alert, cooperative and no distress Lochia: appropriate Uterine Fundus: firm Incision: none DVT Evaluation: No evidence of DVT seen on physical exam. No cords or calf tenderness. No significant calf/ankle edema.   Recent Labs  06/08/16 2157  HGB 10.0*  HCT 30.9*    Assessment/Plan: Breastfeeding, Social Work consult and Contraception IUD vs Depo injections.  SW consult placed for limited prental care in 3rd trimester and hx of positive THC use.   LOS: 1 day   Roe CoombsRachelle A Denney, CNM 06/09/2016, 7:16 AM

## 2016-06-09 NOTE — Clinical Social Work Maternal (Signed)
CLINICAL SOCIAL WORK MATERNAL/CHILD NOTE  Patient Details  Name: Bethany Gallagher MRN: [id number] Date of Birth: 05/12/1990  Date:  11-20-2016  Clinical Social Worker Initiating Note:  Laurey Arrow Date/ Time Initiated:  19-May-2016/1151     Child's Name:  Bethany Gallagher   Legal Guardian:  Mother (FOB is Docia Chuck 06/11/1990)   Need for Interpreter:  None   Date of Referral:  11-18-2016     Reason for Referral:  Late or No Prenatal Care , Current Substance Use/Substance Use During Pregnancy    Referral Source:  Central Nursery   Address:  [address]. [place] [address]  Phone number:  [phone]   Household Members:  Self, Minor Children, Parents   Natural Supports (not living in the home):  Immediate Family, Extended Family, Spouse/significant other   Professional Supports: None   Employment: Part-time   Type of Work: Barrister's clerk   Education:      Museum/gallery curator Resources:  [employer]   Other Resources:  [employer], Physicist, medical    Cultural/Religious Considerations Which May Impact Care:  Per [employer] Sheet, MOB is Non-Denominational.  Strengths:  Ability to meet basic needs , Home prepared for child    Risk Factors/Current Problems:  Substance Use    Cognitive State:  Able to Concentrate , Alert , Insightful , Linear Thinking    Mood/Affect:  Happy , Bright , Comfortable , Interested    CSW Assessment: CSW met with MOB to complete an assessment for hx of THC use during pregnancy and limited PNC.  When CSW arrived, MOB was sitting in bed attaching and bonding with infant.  With MOB's permission, CSW asked MOB's guest to step out of the room in effort to meet with MOB in private.  MOB was polite, inviting, and interested in meeting with CSW.  CSW inquired about MOB's limited PNC.  MOB reported that MOB preferred to work as oppose to attend MOB's PN appointments.  MOB stated the MOB has struggled financially and could not afford to miss work to attend her PN  appointments.  CSW assess MOB for barrier for follow-up appointments for MOB and infant.  MOB denied barriers, and reported that MOB has reliable transportation.  MOB has selected [facility] on [address] for infants follow-up appointments and MOB will return to the [facility] for MOB's 6 week appointment.   CSW encouraged MOB to keep MOB's an infant's appointment and communicate with physicians office if MOB is unable to make the scheduled appointment.   CSW also inquired about MOB's substance abuse hx. MOB was forthcoming and was honest about MOB's substance use.  MOB disclosed that MOB utilized marijuana during pregnancy to assist MOB with increase MOB's appetite.  MOB reported MOB's last use was 05/26/2016.  CSW informed MOB of the hospital's drug screen policy, and informed MOB of the 2 screenings for the infant. MOB appeared understanding and communicated that MOB was not concerned. CSW shared with MOB that the infant's UDS was negative and CSW will continue to monitor the infant's CDS. CSW made MOB aware that if the infant's CDS is positive without an explanation, CSW will make a report to [facility] CPS.  MOB did not have any questions regarding the hospital's policy. MOB also denied a hx of CPS involvement. CSW offered MOB resources and referrals for SA, and MOB declined.  CSW thanked MOB for meeting with CSW and provided MOB with CSW contact information.    CSW Plan/Description:  Information/Referral to [employer] ,  Patient/Family Education , No Further Intervention Required/No Barriers to Discharge   Javoni Lucken Boyd-Gilyard, MSW, LCSW Clinical Social Work [phone]   Miche Loughridge D BOYD-GILYARD, LCSW 06/09/2016, 1:59 PM 

## 2016-06-09 NOTE — Anesthesia Postprocedure Evaluation (Signed)
Anesthesia Post Note  Patient: Bethany Gallagher  Procedure(s) Performed: * No procedures listed *  Patient location during evaluation: Mother Baby Anesthesia Type: Epidural Level of consciousness: awake and alert and oriented Pain management: pain level controlled Vital Signs Assessment: post-procedure vital signs reviewed and stable Respiratory status: spontaneous breathing and nonlabored ventilation Cardiovascular status: stable Postop Assessment: no headache, epidural receding, patient able to bend at knees, adequate PO intake, no backache and no signs of nausea or vomiting Anesthetic complications: no        Last Vitals:  Vitals:   06/09/16 0500 06/09/16 0715  BP: 117/66 112/66  Pulse: 83 81  Resp: 18 16  Temp: 37 C 36.8 C    Last Pain:  Vitals:   06/09/16 0715  TempSrc: Oral  PainSc: 0-No pain   Pain Goal: Patients Stated Pain Goal: 2 (06/08/16 2229)               Laban EmperorMalinova,Alexcia Schools Hristova

## 2016-06-09 NOTE — Lactation Note (Signed)
This note was copied from a baby's chart. Lactation Consultation Note  Patient Name: Bethany Gallagher ZOXWR'UToday's Date: 06/09/2016 Reason for consult: Initial assessment Breastfeeding consultation services and support information given and reviewed.  This is mom's fourth baby but first to breastfeed.  Newborn is 2211 hours old and going to breast without difficulty.  Instructed to feed with any feeding cue and to call out for assist prn.  Maternal Data Does the patient have breastfeeding experience prior to this delivery?: No  Feeding    LATCH Score/Interventions                      Lactation Tools Discussed/Used     Consult Status Consult Status: Follow-up Date: 06/10/16 Follow-up type: In-patient    Huston FoleyMOULDEN, Montay Vanvoorhis S 06/09/2016, 11:52 AM

## 2016-06-10 LAB — RPR: RPR Ser Ql: NONREACTIVE

## 2016-06-10 MED ORDER — SENNOSIDES-DOCUSATE SODIUM 8.6-50 MG PO TABS: 2.0000 | ORAL_TABLET | Freq: Every day | ORAL | 1 refills | Status: DC

## 2016-06-10 MED ORDER — MEDROXYPROGESTERONE ACETATE 150 MG/ML IM SUSP: 150.0000 mg | INTRAMUSCULAR | 4 refills | Status: DC

## 2016-06-10 MED ORDER — IBUPROFEN 600 MG PO TABS: 600.0000 mg | ORAL_TABLET | Freq: Four times a day (QID) | ORAL | 2 refills | Status: DC

## 2016-06-10 NOTE — Lactation Note (Signed)
This note was copied from a baby's chart. Lactation Consultation Note  Patient Name: Bethany Gallagher [id number]'WToday's Date: 06/10/2016 Reason for consult: Follow-up assessment;Other (Comment) (5% weight loss, ) Baby is 35 hours old and has been consistent at the breast and per mom recently breast fed.  Mom  Reports swallows and comfort with latch.  LC discussed with mom - an  experienced breast feeding mother, can potentially have a quicker let down , and more volume. Therefore is the breast are really full to hand express off the 1st breast  1/2 oz of so and latch with breast compressions until swallows,and then intermittent.  Always soften the 1st breast well before offering the 2nd breast.  Mom denies sore ness. Sore nipple and engorgement prevention and tx reviewed.  Per mom has several pumps at home.  Mother informed of post-discharge support and given phone number to the lactation department, including services for phone call assistance; out-patient appointments; and breastfeeding support group. List of other breastfeeding resources in the community given in the handout. Encouraged mother to call for problems or concerns related to breastfeeding.  Maternal Data    Feeding Feeding Type:  (per mom baby recently breast fed ) Length of feed: 30 min (mom reports swallows )  LATCH Score/Interventions Latch: Repeated attempts needed to sustain latch, nipple held in mouth throughout feeding, stimulation needed to elicit sucking reflex. Intervention(s): Skin to skin;Teach feeding cues;Waking techniques Intervention(s): Adjust position;Assist with latch;Breast compression  Audible Swallowing: A few with stimulation Intervention(s): Skin to skin;Hand expression Intervention(s): Skin to skin;Hand expression  Type of Nipple: Everted at rest and after stimulation  Comfort (Breast/Nipple): Soft / non-tender     Hold (Positioning): Assistance needed to correctly position infant at breast and  maintain latch. Intervention(s): Breastfeeding basics reviewed  LATCH Score: 7  Lactation Tools Discussed/Used Tools:  (per mom has several pumps at home )   Consult Status Consult Status: Complete Date: 06/10/16    Bethany Gallagher 06/10/2016, 11:25 AM

## 2016-06-10 NOTE — Progress Notes (Signed)
Post Partum Day #1 Subjective: no complaints, up ad lib, voiding and tolerating PO  Objective: Blood pressure 117/77, pulse 71, temperature 98.2 F (36.8 C), temperature source Oral, resp. rate 18, last menstrual period 09/11/2015, SpO2 100 %, unknown if currently breastfeeding.  Physical Exam:  General: alert, cooperative and no distress Lochia: appropriate Uterine Fundus: firm Incision: none DVT Evaluation: No evidence of DVT seen on physical exam. No cords or calf tenderness. No significant calf/ankle edema.   Recent Labs  06/08/16 2157  HGB 10.0*  HCT 30.9*    Assessment/Plan: Discharge home, Breastfeeding and Contraception Depo injections. Outpatient circ planned.   LOS: 2 days   Bethany Gallagher, CNM 06/10/2016, 7:29 AM

## 2016-06-10 NOTE — Discharge Summary (Signed)
OB Discharge Summary     Patient Name: Bethany Gallagher DOB: [birth year] MRN: [id number]  Date of admission: 06/08/2016 Delivering MD: Aviva SignsWILLIAMS, MARIE L   Date of discharge: 06/10/2016  Admitting diagnosis: 40w ctx 10-15 min apart Intrauterine pregnancy: 2939w6d     Secondary diagnosis:  Active Problems:   Normal labor  Additional problems: Limited Prenatal Care: SW complete, THC used during pregnancy     Discharge diagnosis: Term Pregnancy Delivered                                                                                                Post partum procedures:none  Augmentation: none  Complications: None  Hospital course:  Onset of Labor With Vaginal Delivery     26 y.o. yo [id number] at 7239w6d was admitted in Active Labor on 06/08/2016. Patient had an uncomplicated labor course as follows:  Membrane Rupture Time/Date: 12:16 AM ,06/09/2016   Intrapartum Procedures: Episiotomy: None [1]                                         Lacerations:  None [1]  Patient had a delivery of a Viable infant. 06/09/2016  Information for the patient's newborn:  Dillard CannonWilson, Boy Sheron [[id number]][[id number]]  Delivery Method: Vaginal, Spontaneous Delivery (Filed from Delivery Summary)    Pateint had an uncomplicated postpartum course.  She is ambulating, tolerating a regular diet, passing flatus, and urinating well. Patient is discharged home in stable condition on 06/10/16.   Physical exam  Vitals:   06/09/16 1549 06/09/16 1831 06/10/16 0600 06/10/16 0622  BP: (!) 106/58 122/69 116/72 117/77  Pulse: 75 73 73 71  Resp: 18 16 18 18   Temp: 99.4 F (37.4 C) 98.2 F (36.8 C) 98.5 F (36.9 C) 98.2 F (36.8 C)  TempSrc: Oral Oral Oral Oral  SpO2: 100%      General: alert, cooperative and no distress Lochia: appropriate Uterine Fundus: firm Incision: N/A DVT Evaluation: No evidence of DVT seen on physical exam. No cords or calf tenderness. No significant calf/ankle edema. Labs: Lab Results  Component  Value Date   WBC 8.3 06/08/2016   HGB 10.0 (L) 06/08/2016   HCT 30.9 (L) 06/08/2016   MCV 84.7 06/08/2016   PLT 251 06/08/2016   CMP Latest Ref Rng & Units 01/01/2012  Glucose 70 - 99 mg/dL 92  BUN 6 - 23 mg/dL 6  Creatinine 1.300.50 - 8.651.10 mg/dL 7.840.82  Sodium 696135 - 295145 mEq/L 136  Potassium 3.5 - 5.1 mEq/L 3.2(L)  Chloride 96 - 112 mEq/L 102  CO2 19 - 32 mEq/L 25  Calcium 8.4 - 10.5 mg/dL 7.4(L)  Total Protein 6.0 - 8.3 g/dL 2.8(U5.7(L)  Total Bilirubin 0.3 - 1.2 mg/dL 1.3(K0.2(L)  Alkaline Phos 39 - 117 U/L 181(H)  AST 0 - 37 U/L 43(H)  ALT 0 - 35 U/L 48(H)    Discharge instruction: per After Visit Summary and "Baby and Me Booklet".  After visit meds:  Allergies as of 06/10/2016  No Known Allergies     Medication List    TAKE these medications   ibuprofen 600 MG tablet Commonly known as:  ADVIL,MOTRIN Take 1 tablet (600 mg total) by mouth every 6 (six) hours.   medroxyPROGESTERone 150 MG/ML injection Commonly known as:  DEPO-PROVERA Inject 1 mL (150 mg total) into the muscle every 3 (three) months. Bring to office for administration.   prenatal multivitamin Tabs tablet Take 1 tablet by mouth daily at 12 noon.   senna-docusate 8.6-50 MG tablet Commonly known as:  Senokot-S Take 2 tablets by mouth at bedtime.       Diet: routine diet  Activity: Advance as tolerated. Pelvic rest for 6 weeks.   Outpatient follow up:4 weeks Follow up Appt:No future appointments. Follow up Visit:No Follow-up on file.  Postpartum contraception: Depo Provera  Newborn Data: Live born female  Birth Weight: 6 lb 15.6 oz (3165 g) APGAR: 8, 9  Baby Feeding: Breast Disposition:home with mother   06/10/2016 Roe Coombs, CNM

## 2016-06-10 NOTE — Plan of Care (Signed)
Problem: Nutritional: Goal: Mothers verbalization of comfort with breastfeeding process will improve Outcome: Completed/Met Date Met: 06/10/16 Pt reports comfort with breastfeeding.  Discussed waking techniques.  While observing a latch, RN noted that baby had a shallow latch.  Pt taught to open baby's mouth wider, prior to baby latching. Pt verbalizes understanding.
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
## 2016-07-16 ENCOUNTER — Ambulatory Visit: Payer: Medicaid Other | Admitting: Certified Nurse Midwife

## 2016-10-01 ENCOUNTER — Ambulatory Visit: Payer: Medicaid Other | Admitting: Certified Nurse Midwife

## 2016-12-03 ENCOUNTER — Ambulatory Visit: Payer: Medicaid Other | Admitting: Certified Nurse Midwife

## 2017-04-06 NOTE — L&D Delivery Note (Addendum)
Delivery Note At 5:58 PM a viable and healthy female was delivered via  (Presentation: cephalic; LOA  ).  APGAR: 9, 9.   Placenta status: spontaneous, intact .  Cord: 3 vessel with no complications  Anesthesia:  epidural Episiotomy:  none Lacerations:  none Suture Repair: n/a Est. Blood Loss (mL):  100  Mom to postpartum.  Baby to Couplet care / Skin to Skin.  Myrene Buddy 08/08/2017, 6:10 PM I was present for this delivery and agree with the above assessment

## 2017-05-11 ENCOUNTER — Encounter: Payer: Self-pay | Admitting: Obstetrics and Gynecology

## 2017-07-11 ENCOUNTER — Inpatient Hospital Stay (HOSPITAL_COMMUNITY)
Admission: AD | Admit: 2017-07-11 | Discharge: 2017-07-11 | Disposition: A | Discharge status: Home / Self Care | Payer: Medicaid Other | Source: Ambulatory Visit | Attending: Obstetrics & Gynecology | Admitting: Obstetrics & Gynecology

## 2017-07-11 ENCOUNTER — Other Ambulatory Visit: Payer: Self-pay

## 2017-07-11 ENCOUNTER — Encounter (HOSPITAL_COMMUNITY): Payer: Self-pay | Admitting: *Deleted

## 2017-07-11 DIAGNOSIS — Z3A36 36 weeks gestation of pregnancy: Secondary | ICD-10-CM | POA: Diagnosis not present

## 2017-07-11 DIAGNOSIS — B3731 Acute candidiasis of vulva and vagina: Secondary | ICD-10-CM

## 2017-07-11 DIAGNOSIS — O9989 Other specified diseases and conditions complicating pregnancy, childbirth and the puerperium: Secondary | ICD-10-CM | POA: Diagnosis not present

## 2017-07-11 DIAGNOSIS — O0933 Supervision of pregnancy with insufficient antenatal care, third trimester: Secondary | ICD-10-CM

## 2017-07-11 DIAGNOSIS — B373 Candidiasis of vulva and vagina: Secondary | ICD-10-CM | POA: Insufficient documentation

## 2017-07-11 DIAGNOSIS — O98813 Other maternal infectious and parasitic diseases complicating pregnancy, third trimester: Secondary | ICD-10-CM | POA: Insufficient documentation

## 2017-07-11 DIAGNOSIS — Z87891 Personal history of nicotine dependence: Secondary | ICD-10-CM | POA: Diagnosis not present

## 2017-07-11 DIAGNOSIS — O0930 Supervision of pregnancy with insufficient antenatal care, unspecified trimester: Secondary | ICD-10-CM

## 2017-07-11 DIAGNOSIS — N898 Other specified noninflammatory disorders of vagina: Secondary | ICD-10-CM | POA: Diagnosis present

## 2017-07-11 LAB — CBC
HCT: 28.4 % — ABNORMAL LOW (ref 36.0–46.0)
Hemoglobin: 9.2 g/dL — ABNORMAL LOW (ref 12.0–15.0)
MCH: 27.2 pg (ref 26.0–34.0)
MCHC: 32.4 g/dL (ref 30.0–36.0)
MCV: 84 fL (ref 78.0–100.0)
Platelets: 260 10*3/uL (ref 150–400)
RBC: 3.38 MIL/uL — ABNORMAL LOW (ref 3.87–5.11)
RDW: 14.3 % (ref 11.5–15.5)
WBC: 7 10*3/uL (ref 4.0–10.5)

## 2017-07-11 LAB — URINALYSIS, ROUTINE W REFLEX MICROSCOPIC
Bilirubin Urine: NEGATIVE
Glucose, UA: NEGATIVE mg/dL
Hgb urine dipstick: NEGATIVE
Ketones, ur: NEGATIVE mg/dL
Leukocytes, UA: NEGATIVE
Nitrite: NEGATIVE
Protein, ur: NEGATIVE mg/dL
Specific Gravity, Urine: 1.02 (ref 1.005–1.030)
pH: 6 (ref 5.0–8.0)

## 2017-07-11 LAB — RAPID URINE DRUG SCREEN, HOSP PERFORMED
Amphetamines: NOT DETECTED
Barbiturates: NOT DETECTED
Benzodiazepines: NOT DETECTED
Cocaine: NOT DETECTED
Opiates: NOT DETECTED
Tetrahydrocannabinol: NOT DETECTED

## 2017-07-11 LAB — ABO/RH: ABO/RH(D): O POS

## 2017-07-11 LAB — WET PREP, GENITAL
Clue Cells Wet Prep HPF POC: NONE SEEN
Sperm: NONE SEEN
Trich, Wet Prep: NONE SEEN

## 2017-07-11 MED ORDER — TERCONAZOLE 0.4 % VA CREA: 1.0000 | TOPICAL_CREAM | Freq: Every day | VAGINAL | 0 refills | Status: AC
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
## 2017-07-12 LAB — GC/CHLAMYDIA PROBE AMP (~~LOC~~) NOT AT ARMC
Chlamydia: NEGATIVE
Neisseria Gonorrhea: NEGATIVE

## 2017-07-12 LAB — RPR: RPR Ser Ql: NONREACTIVE

## 2017-07-12 LAB — RUBELLA SCREEN: Rubella: 1.39 index (ref >0.99)

## 2017-07-12 LAB — HIV ANTIBODY (ROUTINE TESTING W REFLEX): HIV Screen 4th Generation wRfx: NONREACTIVE

## 2017-07-12 LAB — HEPATITIS B SURFACE ANTIGEN: Hepatitis B Surface Ag: NEGATIVE

## 2017-07-22 ENCOUNTER — Other Ambulatory Visit: Payer: Self-pay

## 2017-07-22 ENCOUNTER — Ambulatory Visit (INDEPENDENT_AMBULATORY_CARE_PROVIDER_SITE_OTHER): Payer: Medicaid Other | Admitting: Obstetrics

## 2017-07-22 ENCOUNTER — Encounter: Payer: Self-pay | Admitting: Obstetrics

## 2017-07-22 DIAGNOSIS — Z3483 Encounter for supervision of other normal pregnancy, third trimester: Secondary | ICD-10-CM

## 2017-07-22 DIAGNOSIS — B3731 Acute candidiasis of vulva and vagina: Secondary | ICD-10-CM

## 2017-07-22 DIAGNOSIS — B373 Candidiasis of vulva and vagina: Secondary | ICD-10-CM

## 2017-07-22 DIAGNOSIS — O0933 Supervision of pregnancy with insufficient antenatal care, third trimester: Secondary | ICD-10-CM

## 2017-07-22 DIAGNOSIS — Z3493 Encounter for supervision of normal pregnancy, unspecified, third trimester: Secondary | ICD-10-CM | POA: Diagnosis not present

## 2017-07-22 LAB — OB RESULTS CONSOLE GBS: GBS: NEGATIVE

## 2017-07-22 NOTE — Progress Notes (Signed)
NOB very late to Prenatal Care at 38.4 weeks.

## 2017-07-22 NOTE — Addendum Note (Signed)
Addended by: Coral CeoHARPER, CHARLES A on: 07/22/2017 04:21 PM   Modules accepted: Orders

## 2017-07-22 NOTE — Progress Notes (Addendum)
Subjective:    Bethany Gallagher is being seen today for her first obstetrical visit.  This is not a planned pregnancy. She is at 2939w4d gestation. Her obstetrical history is significant for no prenatal care with this pregnancy. Relationship with FOB: significant other, not living together. Patient does intend to breast feed. Pregnancy history fully reviewed.  The information documented in the HPI was reviewed and verified.  Menstrual History: OB History    Gravida  5   Para  4   Term  4   Preterm  0   AB  0   Living  4     SAB  0   TAB  0   Ectopic  0   Multiple  0   Live Births  4            Patient's last menstrual period was 10/25/2016 (approximate).    Past Medical History:  Diagnosis Date  . Hx of chlamydia infection   . Hx of gonorrhea   . No pertinent past medical history   . Pregnancy induced hypertension    2013   . Vaginal Pap smear, abnormal     Past Surgical History:  Procedure Laterality Date  . NO PAST SURGERIES       (Not in a hospital admission) No Known Allergies  Social History   Tobacco Use  . Smoking status: Former Smoker    Types: Cigarettes  . Smokeless tobacco: Former NeurosurgeonUser    Quit date: 11/05/2015  . Tobacco comment: with + preg test  Substance Use Topics  . Alcohol use: Not Currently    Family History  Problem Relation Age of Onset  . Cancer Maternal Grandfather   . Asthma Brother   . Alcohol abuse Neg Hx   . Diabetes Neg Hx   . Heart disease Neg Hx   . Hypertension Neg Hx   . Stroke Neg Hx      Review of Systems Constitutional: negative for weight loss Gastrointestinal: negative for vomiting Genitourinary:negative for genital lesions and vaginal discharge and dysuria Musculoskeletal:negative for back pain Behavioral/Psych: negative for abusive relationship, depression, illegal drug usage and tobacco use    Objective:    LMP 10/25/2016 (Approximate) Comment: July 2018 General Appearance:    Alert,  cooperative, no distress, appears stated age  Head:    Normocephalic, without obvious abnormality, atraumatic  Eyes:    PERRL, conjunctiva/corneas clear, EOM's intact, fundi    benign, both eyes  Ears:    Normal TM's and external ear canals, both ears  Nose:   Nares normal, septum midline, mucosa normal, no drainage    or sinus tenderness  Throat:   Lips, mucosa, and tongue normal; teeth and gums normal  Neck:   Supple, symmetrical, trachea midline, no adenopathy;    thyroid:  no enlargement/tenderness/nodules; no carotid   bruit or JVD  Back:     Symmetric, no curvature, ROM normal, no CVA tenderness  Lungs:     Clear to auscultation bilaterally, respirations unlabored  Chest Wall:    No tenderness or deformity   Heart:    Regular rate and rhythm, S1 and S2 normal, no murmur, rub   or gallop  Breast Exam:    No tenderness, masses, or nipple abnormality  Abdomen:     Soft, non-tender, bowel sounds active all four quadrants,    no masses, no organomegaly  Genitalia:    Normal female without lesion, discharge or tenderness  Extremities:   Extremities normal, atraumatic,  no cyanosis or edema  Pulses:   2+ and symmetric all extremities  Skin:   Skin color, texture, turgor normal, no rashes or lesions  Lymph nodes:   Cervical, supraclavicular, and axillary nodes normal  Neurologic:   CNII-XII intact, normal strength, sensation and reflexes    throughout      Lab Review Urine pregnancy test Labs reviewed yes Radiologic studies reviewed no Assessment and Plan:     1. Encounter for supervision of normal pregnancy in third trimester, unspecified gravidity Rx: - Culture, OB Urine - Culture, beta strep (group b only) - [place] MFM OB COMP + 14 WK; Future - [place] MFM OB DETAIL +14 WK; Future  2. Late prenatal care affecting pregnancy in third trimester Rx: - [place] MFM OB DETAIL +14 WK; Future  3. Candida vaginitis - treated    Plan:    Follow up in 1 week  Prenatal vitamins.  Counseling  provided regarding continued use of seat belts, cessation of alcohol consumption, smoking or use of illicit drugs; infection precautions i.e., influenza/TDAP immunizations, toxoplasmosis,CMV, parvovirus, listeria and varicella; workplace safety, exercise during pregnancy; routine dental care, safe medications, sexual activity, hot tubs, saunas, pools, travel, caffeine use, fish and methlymercury, potential toxins, hair treatments, varicose veins Weight gain recommendations per IOM guidelines reviewed: underweight/BMI< 18.5--> gain 28 - 40 lbs; normal weight/BMI 18.5 - 24.9--> gain 25 - 35 lbs; overweight/BMI 25 - 29.9--> gain 15 - 25 lbs; obese/BMI >30->gain  11 - 20 lbs Problem list reviewed and updated. FIRST/CF mutation testing/NIPT/QUAD SCREEN/fragile X/Ashkenazi Jewish population testing/Spinal muscular atrophy discussed:  Yes Role of ultrasound in pregnancy discussed; fetal survey: Yes Amniocentesis discussed: not indicated.   No orders of the defined types were placed in this encounter.  Orders Placed This Encounter  Procedures  . Culture, OB Urine  . Culture, beta strep (group b only)  . [place] MFM OB COMP + 14 WK    Standing Status:   Future    Standing Expiration Date:   09/22/2018    Order Specific Question:   Reason for Exam (SYMPTOM  OR DIAGNOSIS REQUIRED)    Answer:   Anatomy.  Late to Prenatal Care    Order Specific Question:   Preferred Imaging Location?    Answer:   [facility]Ul[facility]ound    Follow up in 1 weeks. 50% of 20 min visit spent on counseling and coordination of care.     Brock Bad MD 07-22-2017

## 2017-07-24 LAB — CULTURE, OB URINE

## 2017-07-24 LAB — URINE CULTURE, OB REFLEX

## 2017-07-26 LAB — CULTURE, BETA STREP (GROUP B ONLY): Strep Gp B Culture: NEGATIVE

## 2017-07-27 ENCOUNTER — Encounter: Payer: Self-pay | Admitting: Obstetrics & Gynecology

## 2017-07-29 ENCOUNTER — Encounter: Payer: Self-pay | Admitting: Obstetrics

## 2017-07-29 ENCOUNTER — Encounter (HOSPITAL_COMMUNITY): Payer: Self-pay | Admitting: *Deleted

## 2017-07-29 ENCOUNTER — Telehealth (HOSPITAL_COMMUNITY): Payer: Self-pay | Admitting: *Deleted

## 2017-07-29 ENCOUNTER — Ambulatory Visit (INDEPENDENT_AMBULATORY_CARE_PROVIDER_SITE_OTHER): Payer: Medicaid Other | Admitting: Obstetrics

## 2017-07-29 DIAGNOSIS — Z3493 Encounter for supervision of normal pregnancy, unspecified, third trimester: Secondary | ICD-10-CM

## 2017-07-29 DIAGNOSIS — Z3483 Encounter for supervision of other normal pregnancy, third trimester: Secondary | ICD-10-CM | POA: Diagnosis not present

## 2017-07-29 NOTE — Telephone Encounter (Signed)
Preadmission screen  

## 2017-07-29 NOTE — Progress Notes (Signed)
Subjective:  Bethany Gallagher is a 27 y.o. [id number] at [redacted]w[redacted]d being seen today for ongoing prenatal care.  She is currently monitored for the following issues for this low-risk pregnancy and has Mild tetrahydrocannabinol (THC) abuse; Candida vaginitis; Late prenatal care affecting pregnancy in third trimester; and Supervision of normal pregnancy in third trimester on their problem list.  Patient reports no complaints.  Contractions: Irregular. Vag. Bleeding: None.  Movement: Present. Denies leaking of fluid.   The following portions of the patient's history were reviewed and updated as appropriate: allergies, current medications, past family history, past medical history, past social history, past surgical history and problem list. Problem list updated.  Objective:   Vitals:   07/29/17 1138  BP: 113/75  Pulse: (!) 115  Weight: 156 lb 3.2 oz (70.9 kg)    Fetal Status: Fetal Heart Rate (bpm): 150   Movement: Present     General:  Alert, oriented and cooperative. Patient is in no acute distress.  Skin: Skin is warm and dry. No rash noted.   Cardiovascular: Normal heart rate noted  Respiratory: Normal respiratory effort, no problems with respiration noted  Abdomen: Soft, gravid, appropriate for gestational age. Pain/Pressure: Present     Pelvic:  Cervical exam deferred        Extremities: Normal range of motion.  Edema: None  Mental Status: Normal mood and affect. Normal behavior. Normal judgment and thought content.   Urinalysis:      Assessment and Plan:  Pregnancy: G5P4004 at [redacted]w[redacted]d  1. Encounter for supervision of normal pregnancy in third trimester, unspecified gravidity   Term labor symptoms and general obstetric precautions including but not limited to vaginal bleeding, contractions, leaking of fluid and fetal movement were reviewed in detail with the patient. Please refer to After Visit Summary for other counseling recommendations.  Return in about 1 week (around 08/05/2017) for  ROB.   Brock Bad, MD

## 2017-07-30 ENCOUNTER — Other Ambulatory Visit: Payer: Self-pay | Admitting: Obstetrics

## 2017-07-30 ENCOUNTER — Encounter (HOSPITAL_COMMUNITY): Payer: Self-pay

## 2017-07-30 ENCOUNTER — Ambulatory Visit (HOSPITAL_COMMUNITY)
Admission: RE | Admit: 2017-07-30 | Discharge: 2017-07-30 | Disposition: A | Discharge status: Home / Self Care | Payer: Medicaid Other | Source: Ambulatory Visit | Attending: Certified Nurse Midwife | Admitting: Certified Nurse Midwife

## 2017-07-30 DIAGNOSIS — Z3A39 39 weeks gestation of pregnancy: Secondary | ICD-10-CM | POA: Insufficient documentation

## 2017-07-30 DIAGNOSIS — Z3493 Encounter for supervision of normal pregnancy, unspecified, third trimester: Secondary | ICD-10-CM

## 2017-07-30 DIAGNOSIS — O0933 Supervision of pregnancy with insufficient antenatal care, third trimester: Secondary | ICD-10-CM

## 2017-07-30 DIAGNOSIS — B373 Candidiasis of vulva and vagina: Secondary | ICD-10-CM

## 2017-07-30 DIAGNOSIS — O09293 Supervision of pregnancy with other poor reproductive or obstetric history, third trimester: Secondary | ICD-10-CM | POA: Diagnosis not present

## 2017-07-30 DIAGNOSIS — O09299 Supervision of pregnancy with other poor reproductive or obstetric history, unspecified trimester: Secondary | ICD-10-CM

## 2017-07-30 DIAGNOSIS — Z363 Encounter for antenatal screening for malformations: Secondary | ICD-10-CM

## 2017-07-30 DIAGNOSIS — B3731 Acute candidiasis of vulva and vagina: Secondary | ICD-10-CM

## 2017-08-03 ENCOUNTER — Other Ambulatory Visit: Payer: Self-pay | Admitting: Obstetrics and Gynecology

## 2017-08-05 ENCOUNTER — Encounter: Payer: Self-pay | Admitting: Obstetrics

## 2017-08-05 ENCOUNTER — Ambulatory Visit (INDEPENDENT_AMBULATORY_CARE_PROVIDER_SITE_OTHER): Payer: Medicaid Other | Admitting: Obstetrics

## 2017-08-05 VITALS — BP 127/87 | HR 127 | Wt 156.6 lb

## 2017-08-05 DIAGNOSIS — O09893 Supervision of other high risk pregnancies, third trimester: Secondary | ICD-10-CM | POA: Diagnosis not present

## 2017-08-05 DIAGNOSIS — Z3483 Encounter for supervision of other normal pregnancy, third trimester: Secondary | ICD-10-CM

## 2017-08-05 DIAGNOSIS — O0933 Supervision of pregnancy with insufficient antenatal care, third trimester: Secondary | ICD-10-CM

## 2017-08-05 NOTE — Progress Notes (Addendum)
I reviewed the NST and agree with the nursing assessment.  EFM: Baseline: 140-155 bpm, Variability: Good {> 6 bpm), Accelerations: Reactive and Decelerations: Absent Toco: none  Brock Bad, MD 08/05/2017 12:01 PM Subjective:  Bethany Gallagher is a 27 y.o. [id number] at [redacted]w[redacted]d being seen today for ongoing prenatal care.  She is currently monitored for the following issues for this low-risk pregnancy and has Mild tetrahydrocannabinol (THC) abuse; Candida vaginitis; Late prenatal care affecting pregnancy in third trimester; and Supervision of normal pregnancy in third trimester on their problem list.  Patient reports no complaints.  Contractions: Irregular. Vag. Bleeding: None.  Movement: Present. Denies leaking of fluid.   The following portions of the patient's history were reviewed and updated as appropriate: allergies, current medications, past family history, past medical history, past social history, past surgical history and problem list. Problem list updated.  Objective:   Vitals:   08/05/17 1030  BP: 127/87  Pulse: (!) 127  Weight: 156 lb 9.6 oz (71 kg)    Fetal Status: Fetal Heart Rate (bpm): 150   Movement: Present     General:  Alert, oriented and cooperative. Patient is in no acute distress.  Skin: Skin is warm and dry. No rash noted.   Cardiovascular: Normal heart rate noted  Respiratory: Normal respiratory effort, no problems with respiration noted  Abdomen: Soft, gravid, appropriate for gestational age. Pain/Pressure: Absent     Pelvic:  Cervical exam deferred        Extremities: Normal range of motion.  Edema: None  Mental Status: Normal mood and affect. Normal behavior. Normal judgment and thought content.   Urinalysis:      Assessment and Plan:  Pregnancy: G5P4004 at [redacted]w[redacted]d  1. Encounter for supervision of other normal pregnancy in third trimester Rx: - Fetal nonstress test:  REACTIVE.  Baseline 140-155 bpm.  Good variability.  15x15 accels.  No  decels.  2. Late prenatal care affecting pregnancy in third trimester   3. Short interval between pregnancies affecting pregnancy in third trimester, antepartum   Term labor symptoms and general obstetric precautions including but not limited to vaginal bleeding, contractions, leaking of fluid and fetal movement were reviewed in detail with the patient. Please refer to After Visit Summary for other counseling recommendations.  Return in about 1 month (around 09/02/2017) for Postpartum.   Brock Bad, MD  08-05-2017

## 2017-08-05 NOTE — Progress Notes (Signed)
Patient reports fetal movement with irregular contractions. 

## 2017-08-08 ENCOUNTER — Inpatient Hospital Stay (HOSPITAL_COMMUNITY): Payer: Medicaid Other | Admitting: Anesthesiology

## 2017-08-08 ENCOUNTER — Encounter (HOSPITAL_COMMUNITY): Payer: Self-pay

## 2017-08-08 ENCOUNTER — Inpatient Hospital Stay (HOSPITAL_COMMUNITY)
Admission: RE | Admit: 2017-08-08 | Discharge: 2017-08-10 | DRG: 807 | Disposition: A | Discharge status: Home / Self Care | Payer: Medicaid Other | Source: Ambulatory Visit | Attending: Obstetrics & Gynecology | Admitting: Obstetrics & Gynecology

## 2017-08-08 DIAGNOSIS — Z87891 Personal history of nicotine dependence: Secondary | ICD-10-CM

## 2017-08-08 DIAGNOSIS — Z3A41 41 weeks gestation of pregnancy: Secondary | ICD-10-CM

## 2017-08-08 DIAGNOSIS — Z3483 Encounter for supervision of other normal pregnancy, third trimester: Secondary | ICD-10-CM

## 2017-08-08 DIAGNOSIS — O480 Post-term pregnancy: Secondary | ICD-10-CM | POA: Diagnosis not present

## 2017-08-08 DIAGNOSIS — B373 Candidiasis of vulva and vagina: Secondary | ICD-10-CM

## 2017-08-08 DIAGNOSIS — O0933 Supervision of pregnancy with insufficient antenatal care, third trimester: Secondary | ICD-10-CM

## 2017-08-08 DIAGNOSIS — B3731 Acute candidiasis of vulva and vagina: Secondary | ICD-10-CM

## 2017-08-08 LAB — TYPE AND SCREEN
ABO/RH(D): O POS
Antibody Screen: NEGATIVE

## 2017-08-08 LAB — RAPID URINE DRUG SCREEN, HOSP PERFORMED
Amphetamines: NOT DETECTED
Barbiturates: NOT DETECTED
Benzodiazepines: NOT DETECTED
Cocaine: NOT DETECTED
Opiates: NOT DETECTED
Tetrahydrocannabinol: NOT DETECTED

## 2017-08-08 LAB — CBC
HCT: 31.3 % — ABNORMAL LOW (ref 36.0–46.0)
Hemoglobin: 10 g/dL — ABNORMAL LOW (ref 12.0–15.0)
MCH: 25.9 pg — ABNORMAL LOW (ref 26.0–34.0)
MCHC: 31.9 g/dL (ref 30.0–36.0)
MCV: 81.1 fL (ref 78.0–100.0)
Platelets: 260 10*3/uL (ref 150–400)
RBC: 3.86 MIL/uL — ABNORMAL LOW (ref 3.87–5.11)
RDW: 15.4 % (ref 11.5–15.5)
WBC: 5.4 10*3/uL (ref 4.0–10.5)

## 2017-08-08 LAB — RPR: RPR Ser Ql: NONREACTIVE

## 2017-08-08 MED ORDER — LACTATED RINGERS IV SOLN
INTRAVENOUS | Status: DC
  Administered 2017-08-08: 125 mL via INTRAVENOUS
  Administered 2017-08-08: 16:00:00 via INTRAVENOUS

## 2017-08-08 MED ORDER — LACTATED RINGERS IV SOLN: 500.0000 mL | INTRAVENOUS | Status: DC | PRN

## 2017-08-08 MED ORDER — DIPHENHYDRAMINE HCL 25 MG PO CAPS: 25.0000 mg | ORAL_CAPSULE | Freq: Four times a day (QID) | ORAL | Status: DC | PRN

## 2017-08-08 MED ORDER — SIMETHICONE 80 MG PO CHEW: 80.0000 mg | CHEWABLE_TABLET | ORAL | Status: DC | PRN

## 2017-08-08 MED ORDER — FENTANYL 2.5 MCG/ML BUPIVACAINE 1/10 % EPIDURAL INFUSION (WH - ANES)
14.0000 mL/h | INTRAMUSCULAR | Status: DC | PRN
  Administered 2017-08-08: 14 mL/h via EPIDURAL
  Filled 2017-08-08: qty 100

## 2017-08-08 MED ORDER — PRENATAL MULTIVITAMIN CH
1.0000 | ORAL_TABLET | Freq: Every day | ORAL | Status: DC
  Administered 2017-08-09 – 2017-08-10 (×2): 1 via ORAL
  Filled 2017-08-08 (×2): qty 1

## 2017-08-08 MED ORDER — EPHEDRINE 5 MG/ML INJ
10.0000 mg | INTRAVENOUS | Status: DC | PRN
  Filled 2017-08-08: qty 2

## 2017-08-08 MED ORDER — OXYTOCIN 40 UNITS IN LACTATED RINGERS INFUSION - SIMPLE MED
2.5000 [IU]/h | INTRAVENOUS | Status: DC
  Administered 2017-08-08: 2.5 [IU]/h via INTRAVENOUS

## 2017-08-08 MED ORDER — ZOLPIDEM TARTRATE 5 MG PO TABS
5.0000 mg | ORAL_TABLET | Freq: Every evening | ORAL | Status: DC | PRN
Start: 2017-08-08 — End: 2017-08-10

## 2017-08-08 MED ORDER — WITCH HAZEL-GLYCERIN EX PADS: 1.0000 "application " | MEDICATED_PAD | CUTANEOUS | Status: DC | PRN

## 2017-08-08 MED ORDER — TERBUTALINE SULFATE 1 MG/ML IJ SOLN
0.2500 mg | Freq: Once | INTRAMUSCULAR | Status: DC | PRN
  Filled 2017-08-08: qty 1

## 2017-08-08 MED ORDER — LIDOCAINE HCL (PF) 1 % IJ SOLN
INTRAMUSCULAR | Status: DC | PRN
  Administered 2017-08-08: 6 mL via EPIDURAL

## 2017-08-08 MED ORDER — COCONUT OIL OIL: 1.0000 "application " | TOPICAL_OIL | Status: DC | PRN

## 2017-08-08 MED ORDER — ONDANSETRON HCL 4 MG/2ML IJ SOLN: 4.0000 mg | INTRAMUSCULAR | Status: DC | PRN

## 2017-08-08 MED ORDER — ACETAMINOPHEN 325 MG PO TABS
650.0000 mg | ORAL_TABLET | ORAL | Status: DC | PRN
  Administered 2017-08-09 (×2): 650 mg via ORAL
  Filled 2017-08-08 (×2): qty 2

## 2017-08-08 MED ORDER — PHENYLEPHRINE 40 MCG/ML (10ML) SYRINGE FOR IV PUSH (FOR BLOOD PRESSURE SUPPORT): 80.0000 ug | PREFILLED_SYRINGE | INTRAVENOUS | Status: DC | PRN

## 2017-08-08 MED ORDER — LACTATED RINGERS IV SOLN
500.0000 mL | Freq: Once | INTRAVENOUS | Status: AC
  Administered 2017-08-08: 500 mL via INTRAVENOUS

## 2017-08-08 MED ORDER — OXYCODONE-ACETAMINOPHEN 5-325 MG PO TABS: 1.0000 | ORAL_TABLET | ORAL | Status: DC | PRN

## 2017-08-08 MED ORDER — ONDANSETRON HCL 4 MG/2ML IJ SOLN: 4.0000 mg | Freq: Four times a day (QID) | INTRAMUSCULAR | Status: DC | PRN

## 2017-08-08 MED ORDER — DIBUCAINE 1 % RE OINT: 1.0000 "application " | TOPICAL_OINTMENT | RECTAL | Status: DC | PRN

## 2017-08-08 MED ORDER — ACETAMINOPHEN 325 MG PO TABS: 650.0000 mg | ORAL_TABLET | ORAL | Status: DC | PRN

## 2017-08-08 MED ORDER — IBUPROFEN 600 MG PO TABS
600.0000 mg | ORAL_TABLET | Freq: Four times a day (QID) | ORAL | Status: DC
  Administered 2017-08-08 – 2017-08-10 (×7): 600 mg via ORAL
  Filled 2017-08-08 (×7): qty 1

## 2017-08-08 MED ORDER — EPHEDRINE 5 MG/ML INJ: 10.0000 mg | INTRAVENOUS | Status: DC | PRN

## 2017-08-08 MED ORDER — OXYTOCIN 40 UNITS IN LACTATED RINGERS INFUSION - SIMPLE MED
1.0000 m[IU]/min | INTRAVENOUS | Status: DC
  Administered 2017-08-08: 2 m[IU]/min via INTRAVENOUS
  Filled 2017-08-08: qty 1000

## 2017-08-08 MED ORDER — OXYTOCIN BOLUS FROM INFUSION: 500.0000 mL | Freq: Once | INTRAVENOUS | Status: DC

## 2017-08-08 MED ORDER — ONDANSETRON HCL 4 MG PO TABS: 4.0000 mg | ORAL_TABLET | ORAL | Status: DC | PRN

## 2017-08-08 MED ORDER — BENZOCAINE-MENTHOL 20-0.5 % EX AERO: 1.0000 "application " | INHALATION_SPRAY | CUTANEOUS | Status: DC | PRN

## 2017-08-08 MED ORDER — LIDOCAINE HCL (PF) 1 % IJ SOLN
30.0000 mL | INTRAMUSCULAR | Status: DC | PRN
  Filled 2017-08-08: qty 30

## 2017-08-08 MED ORDER — SENNOSIDES-DOCUSATE SODIUM 8.6-50 MG PO TABS
2.0000 | ORAL_TABLET | ORAL | Status: DC
  Administered 2017-08-08 – 2017-08-10 (×2): 2 via ORAL
  Filled 2017-08-08 (×2): qty 2

## 2017-08-08 MED ORDER — TETANUS-DIPHTH-ACELL PERTUSSIS 5-2.5-18.5 LF-MCG/0.5 IM SUSP: 0.5000 mL | Freq: Once | INTRAMUSCULAR | Status: DC

## 2017-08-08 MED ORDER — PHENYLEPHRINE 40 MCG/ML (10ML) SYRINGE FOR IV PUSH (FOR BLOOD PRESSURE SUPPORT)
80.0000 ug | PREFILLED_SYRINGE | INTRAVENOUS | Status: DC | PRN
  Filled 2017-08-08: qty 5

## 2017-08-08 MED ORDER — FENTANYL CITRATE (PF) 100 MCG/2ML IJ SOLN: 100.0000 ug | INTRAMUSCULAR | Status: DC | PRN

## 2017-08-08 MED ORDER — PHENYLEPHRINE 40 MCG/ML (10ML) SYRINGE FOR IV PUSH (FOR BLOOD PRESSURE SUPPORT)
80.0000 ug | PREFILLED_SYRINGE | INTRAVENOUS | Status: DC | PRN
  Filled 2017-08-08: qty 10
  Filled 2017-08-08: qty 5

## 2017-08-08 MED ORDER — LACTATED RINGERS IV SOLN: 500.0000 mL | Freq: Once | INTRAVENOUS | Status: DC

## 2017-08-08 MED ORDER — SOD CITRATE-CITRIC ACID 500-334 MG/5ML PO SOLN: 30.0000 mL | ORAL | Status: DC | PRN

## 2017-08-08 MED ORDER — OXYCODONE-ACETAMINOPHEN 5-325 MG PO TABS: 2.0000 | ORAL_TABLET | ORAL | Status: DC | PRN

## 2017-08-08 MED ORDER — DIPHENHYDRAMINE HCL 50 MG/ML IJ SOLN: 12.5000 mg | INTRAMUSCULAR | Status: DC | PRN

## 2017-08-08 NOTE — H&P (Addendum)
LABOR AND DELIVERY ADMISSION HISTORY AND PHYSICAL NOTE  TOMASA DOBRANSKY is a 27 y.o. female [phone] with IUP at [redacted]w[redacted]d  presenting for IOL for postdates.  She reports positive fetal movement. She denies leakage of fluid or vaginal bleeding.  Prenatal History/Complications: PNC at [facility] Pregnancy complications:  - Hx of THC use - Late PNC starting at 43 weeks  Past Medical History: Past Medical History:  Diagnosis Date  . Hx of chlamydia infection   . Hx of gonorrhea   . Pregnancy induced hypertension    2013   . Vaginal Pap smear, abnormal     Past Surgical History: Past Surgical History:  Procedure Laterality Date  . NO PAST SURGERIES      Obstetrical History: OB History    Gravida  5   Para  4   Term  4   Preterm  0   AB  0   Living  4     SAB  0   TAB  0   Ectopic  0   Multiple  0   Live Births  4           Social History: Social History   Socioeconomic History  . Marital status: Single    Spouse name: Not on file  . Number of children: Not on file  . Years of education: Not on file  . Highest education level: Not on file  Occupational History  . Not on file  Social Needs  . Financial resource strain: Not on file  . Food insecurity:    Worry: Not on file    Inability: Not on file  . Transportation needs:    Medical: Not on file    Non-medical: Not on file  Tobacco Use  . Smoking status: Former Smoker    Types: Cigarettes  . Smokeless tobacco: Former Neurosurgeon    Quit date: 11/05/2015  . Tobacco comment: with + preg test  Substance and Sexual Activity  . Alcohol use: Not Currently  . Drug use: No    Comment: denies use of marajuana with current pregnancy  . Sexual activity: Yes    Birth control/protection: None  Lifestyle  . Physical activity:    Days per week: Not on file    Minutes per session: Not on file  . Stress: Not on file  Relationships  . Social connections:    Talks on phone: Not on file    Gets together: Not on  file    Attends religious service: Not on file    Active member of club or organization: Not on file    Attends meetings of clubs or organizations: Not on file    Relationship status: Not on file  Other Topics Concern  . Not on file  Social History Narrative  . Not on file    Family History: Family History  Problem Relation Age of Onset  . Cancer Maternal Grandfather   . Asthma Brother   . Alcohol abuse Neg Hx   . Diabetes Neg Hx   . Heart disease Neg Hx   . Hypertension Neg Hx   . Stroke Neg Hx     Allergies: No Known Allergies  Medications Prior to Admission  Medication Sig Dispense Refill Last Dose  . Prenatal Vit-Fe Fumarate-FA (PRENATAL MULTIVITAMIN) TABS tablet Take 1 tablet by mouth daily at 12 noon.   08/07/2017 at Unknown time  . senna-docusate (SENOKOT-S) 8.6-50 MG tablet Take 2 tablets by mouth at bedtime. (Patient not taking:  Reported on 07/30/2017) 60 tablet 1 Not Taking     Review of Systems  All systems reviewed and negative except as stated in HPI  Physical Exam Blood pressure (!) 94/58, pulse 92, temperature (!) 97.5 F (36.4 C), temperature source Oral, resp. rate 20, height  (1.651 m), weight 155 lb (70.3 kg), last menstrual period 10/25/2016, unknown if currently breastfeeding. General appearance: alert, oriented, NAD Lungs: normal respiratory effort Heart: regular rate Abdomen: soft, non-tender; gravid, FH appropriate for GA Extremities: No calf swelling or tenderness Presentation: cephalic Fetal monitoring: baseline rate 140, moderate varibility, +acel, no decel Uterine activity: occ ctx Dilation: 3 Effacement (%): 60 Station: -2 Exam by:: Delrae Sawyers RN  Prenatal labs: ABO, Rh: --/--/O POS (05/05 1610) Antibody: NEG (05/05 0859) Rubella: 1.39 (04/07 1659) RPR: Non Reactive (04/07 1659)  HBsAg: Negative (04/07 1659)  HIV: Non Reactive (04/07 1659)  GC/Chlamydia: negative GBS: Negative (04/18 0000)  2-hr GTT: n/a Genetic screening:   Too late Anatomy US: normal gross anatomy at 39 weeks, but limited scan d/t advanced GA  Prenatal Transfer Tool  Maternal Diabetes: No Genetic Screening: not done, too late Maternal Ultrasounds/Referrals: Normal Fetal Ultrasounds or other Referrals:  None Maternal Substance Abuse:  Yes:  Type: Marijuana Significant Maternal Medications:  None Significant Maternal Lab Results: None  Results for orders placed or performed during the hospital encounter of 08/08/17 (from the past 24 hour(s))  CBC   Collection Time: 08/08/17  8:59 AM  Result Value Ref Range   WBC 5.4 4.0 - 10.5 K/uL   RBC 3.86 (L) 3.87 - 5.11 MIL/uL   Hemoglobin 10.0 (L) 12.0 - 15.0 g/dL   HCT 96.0 (L) 45.4 - 09.8 %   MCV 81.1 78.0 - 100.0 fL   MCH 25.9 (L) 26.0 - 34.0 pg   MCHC 31.9 30.0 - 36.0 g/dL   RDW 11.9 14.7 - 82.9 %   Platelets 260 150 - 400 K/uL  Type and screen   Collection Time: 08/08/17  8:59 AM  Result Value Ref Range   ABO/RH(D) O POS    Antibody Screen NEG    Sample Expiration      08/11/2017 Performed at [facility], [address] [place], [place] [address]     Assessment: LEVON PENNING is a 27 y.o. [id number] at [redacted]w[redacted]d here for IOL for postdates  #Labor: Cervix is ripe. Start IV Pitocin #Pain: planning on getting epidural #FWB: Cat I #ID:  GBS neg #MOF: bottle #MOC: interval BTL, has not signed papers #Circ:  outpatient  Kandra Nicolas Arsal Tappan 08/08/2017, 10:49 AM

## 2017-08-08 NOTE — Progress Notes (Signed)
LABOR PROGRESS NOTE  DEBRAANN LIVINGSTONE is a 27 y.o. [id number] at [redacted]w[redacted]d  admitted for IOL for postdates  Subjective: Patient doing well. Reports feeling contractions, but not very strong yet  Objective: BP 104/67   Pulse 93   Temp 98.4 F (36.9 C) (Oral)   Resp 18   Ht  (1.651 m)   Wt 155 lb (70.3 kg)   LMP 10/25/2016 (Approximate) Comment: July 2018  BMI 25.79 kg/m  or  Vitals:   08/08/17 1333 08/08/17 1402 08/08/17 1433 08/08/17 1457  BP: 104/60 (!) 104/59 106/71 104/67  Pulse: 90 93 (!) 113 93  Resp: Temp:      TempSrc:      Weight:      Height:        SVE: Dilation: 4 Effacement (%): 70 Cervical Position: Posterior Station: -2 Presentation: Vertex Exam by:: Dr Nira Retort FHT: baseline rate 135, moderate varibility, +acel, no decel Toco: ctx q 2-4 min  Assessment / Plan: 27 y.o. [id number] at [redacted]w[redacted]d here for IOL for postdates  Labor: AROM with clear fluid. Continue to titrate Pitocin Fetal Wellbeing:  Cat I Pain Control:  Planning on epidural Anticipated MOD:  SVD  Frederik Pear, MD 08/08/2017, 3:22 PM

## 2017-08-08 NOTE — Anesthesia Procedure Notes (Signed)
Epidural Patient location during procedure: OB Start time: 08/08/2017 4:44 PM End time: 08/08/2017 4:56 PM  Staffing Anesthesiologist: Trevor Iha, MD Performed: anesthesiologist   Preanesthetic Checklist Completed: patient identified, site marked, surgical consent, pre-op evaluation, timeout performed, IV checked, risks and benefits discussed and monitors and equipment checked  Epidural Patient position: sitting Prep: site prepped and draped and DuraPrep Patient monitoring: continuous pulse ox and blood pressure Approach: midline Location: L3-L4 Injection technique: LOR air  Needle:  Needle type: Tuohy  Needle gauge: 17 G Needle length: 9 cm and 9 Needle insertion depth: 5 cm cm Catheter type: closed end flexible Catheter size: 19 Gauge Catheter at skin depth: 10 cm Test dose: negative  Assessment Events: blood not aspirated, injection not painful, no injection resistance, negative IV test and no paresthesia

## 2017-08-08 NOTE — Progress Notes (Signed)
Subjective: Doing well with no complaints. Starting to feel contractions a little more.   Objective: BP 104/60   Pulse 90   Temp 98.4 F (36.9 C) (Oral)   Resp 18   Ht  (1.651 m)   Wt 70.3 kg (155 lb)   LMP 10/25/2016 (Approximate) Comment: July 2018  BMI 25.79 kg/m  No intake/output data recorded. No intake/output data recorded.  FHT:  FHR: 140 bpm, variability: moderate,  accelerations:  Present,  decelerations:  Absent UC:   regular, every 3-4 minutes SVE:   Dilation: 3 Effacement (%): 60 Station: -2 Exam by:: S Grindstaff RN  Labs: Lab Results  Component Value Date   WBC 5.4 08/08/2017   HGB 10.0 (L) 08/08/2017   HCT 31.3 (L) 08/08/2017   MCV 81.1 08/08/2017   PLT 260 08/08/2017    Assessment / Plan: Induction of labor due to postterm,  progressing well on pitocin. Progressing on pitocin, consider AROM pending course.  Labor: Progressing on Pitocin, will continue to increase then AROM Preeclampsia:  N/A Fetal Wellbeing:  Category I Pain Control:  requests epidural I/D:  n/a Anticipated MOD:  NSVD  Myrene Buddy 08/08/2017, 1:38 PM

## 2017-08-08 NOTE — Anesthesia Preprocedure Evaluation (Addendum)
Anesthesia Evaluation  Patient identified by MRN, date of birth, ID band Patient awake    Reviewed: Allergy & Precautions, NPO status , Patient's Chart, lab work & pertinent test results  Airway Mallampati: II  TM Distance: >3 FB Neck ROM: Full    Dental no notable dental hx.    Pulmonary former smoker,    Pulmonary exam normal breath sounds clear to auscultation       Cardiovascular Exercise Tolerance: Good hypertension, Normal cardiovascular exam Rhythm:Regular Rate:Normal     Neuro/Psych negative neurological ROS  negative psych ROS   GI/Hepatic negative GI ROS, Neg liver ROS,   Endo/Other  negative endocrine ROS  Renal/GU negative Renal ROS     Musculoskeletal negative musculoskeletal ROS (+)   Abdominal   Peds  Hematology negative hematology ROS (+)   Anesthesia Other Findings History of Late Prenatal Care 38wks  Reproductive/Obstetrics (+) Pregnancy                            Drugs of Abuse     Component Value Date/Time   LABOPIA NONE DETECTED 07/11/2017 1739   COCAINSCRNUR NONE DETECTED 07/11/2017 1739   LABBENZ NONE DETECTED 07/11/2017 1739   AMPHETMU NONE DETECTED 07/11/2017 1739   THCU NONE DETECTED 07/11/2017 1739   LABBARB NONE DETECTED 07/11/2017 1739     Lab Results  Component Value Date   WBC 5.4 08/08/2017   HGB 10.0 (L) 08/08/2017   HCT 31.3 (L) 08/08/2017   MCV 81.1 08/08/2017   PLT 260 08/08/2017    Anesthesia Physical Anesthesia Plan  ASA: II  Anesthesia Plan: Epidural   Post-op Pain Management:    Induction:   PONV Risk Score and Plan:   Airway Management Planned:   Additional Equipment:   Intra-op Plan:   Post-operative Plan:   Informed Consent: I have reviewed the patients History and Physical, chart, labs and discussed the procedure including the risks, benefits and alternatives for the proposed anesthesia with the patient or  authorized representative who has indicated his/her understanding and acceptance.     Plan Discussed with:   Anesthesia Plan Comments:         Anesthesia Quick Evaluation

## 2017-08-08 NOTE — Progress Notes (Signed)
Pt does not want epidural at this time

## 2017-08-08 NOTE — Progress Notes (Signed)
Pt had turned to right side and up to bathroom

## 2017-08-08 NOTE — Anesthesia Pain Management Evaluation Note (Signed)
  [facility] Pain Management Visit Note  Patient: Bethany Gallagher, 27 y.o., female  "Hello I am a member of the anesthesia team at [facility]. We have an anesthesia team available at all times to provide care throughout the hospital, including epidural management and anesthesia for C-section. I don't know your plan for the delivery whether it a natural birth, water birth, IV sedation, nitrous supplementation, doula or epidural, but we want to meet your pain goals."   1.Was your pain managed to your expectations on prior hospitalizations?   Yes   2.What is your expectation for pain management during this hospitalization?     Epidural  3.How can we help you reach that goal? Support prn  Record the patient's initial score and the patient's pain goal.   Pain: 4  Pain Goal: 8 [facility] wants you to be able to say your pain was always managed very well.  [facility] 08/08/2017

## 2017-08-09 ENCOUNTER — Encounter (HOSPITAL_COMMUNITY): Payer: Self-pay

## 2017-08-09 NOTE — Progress Notes (Signed)
POSTPARTUM PROGRESS NOTE  Post Partum Day 1  Subjective:  Bethany Gallagher is a 27 y.o. [id number] s/p SVD at [redacted]w[redacted]d.  She reports she is doing well. No acute events overnight. She denies any problems with ambulating, voiding or po intake. Denies nausea or vomiting.  Pain is well controlled.  Lochia is moderate.  Objective: Blood pressure 112/66, pulse 81, temperature 97.9 F (36.6 C), temperature source Oral, resp. rate 18, height  (1.651 m), weight 155 lb (70.3 kg), last menstrual period 10/25/2016, SpO2 96 %, unknown if currently breastfeeding.  Physical Exam:  General: alert, cooperative and no distress Chest: no respiratory distress Heart:regular rate, distal pulses intact Abdomen: soft, nontender,  Uterine Fundus: firm, appropriately tender DVT Evaluation: No calf swelling or tenderness Extremities: no edema Skin: warm, dry  Recent Labs    08/08/17 0859  HGB 10.0*  HCT 31.3*    Assessment/Plan: Bethany Gallagher is a 27 y.o. [id number] s/p SVD at [redacted]w[redacted]d   PPD#1 - Doing well  Routine postpartum care Contraception: interval BTL. Will sign BTL papers today. Msg send to [facility] to schedule pre-op visit Feeding: bottle Dispo: Plan for discharge tomorrow.   LOS: 1 day   Kandra Nicolas DegeleMD 08/09/2017, 8:41 AM

## 2017-08-09 NOTE — Anesthesia Postprocedure Evaluation (Signed)
Anesthesia Post Note  Patient: Bethany Gallagher  Procedure(s) Performed: AN AD HOC LABOR EPIDURAL     Patient location during evaluation: Mother Baby Anesthesia Type: Epidural Level of consciousness: awake, awake and alert and oriented Pain management: pain level controlled Vital Signs Assessment: post-procedure vital signs reviewed and stable Respiratory status: spontaneous breathing, nonlabored ventilation and respiratory function stable Cardiovascular status: stable Postop Assessment: no headache, no backache, patient able to bend at knees, no apparent nausea or vomiting, adequate PO intake and able to ambulate Anesthetic complications: no    Last Vitals:  Vitals:   08/09/17 0200 08/09/17 0525  BP: 104/63 112/66  Pulse: 81 81  Resp: 18 18  Temp: 36.8 C 36.6 C  SpO2: 100% 96%    Last Pain:  Vitals:   08/09/17 0525  TempSrc: Oral  PainSc: 5    Pain Goal:                 [name]
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
## 2017-08-10 MED ORDER — IBUPROFEN 600 MG PO TABS: 600.0000 mg | ORAL_TABLET | Freq: Four times a day (QID) | ORAL | 0 refills | Status: DC

## 2017-08-10 NOTE — Discharge Instructions (Signed)
Vaginal Delivery, Care After °Refer to this sheet in the next few weeks. These instructions provide you with information about caring for yourself after vaginal delivery. Your health care provider may also give you more specific instructions. Your treatment has been planned according to current medical practices, but problems sometimes occur. Call your health care provider if you have any problems or questions. °What can I expect after the procedure? °After vaginal delivery, it is common to have: °· Some bleeding from your vagina. °· Soreness in your abdomen, your vagina, and the area of skin between your vaginal opening and your anus (perineum). °· Pelvic cramps. °· Fatigue. ° °Follow these instructions at home: °Medicines °· Take over-the-counter and prescription medicines only as told by your health care provider. °· If you were prescribed an antibiotic medicine, take it as told by your health care provider. Do not stop taking the antibiotic until it is finished. °Driving ° °· Do not drive or operate heavy machinery while taking prescription pain medicine. °· Do not drive for 24 hours if you received a sedative. °Lifestyle °· Do not drink alcohol. This is especially important if you are breastfeeding or taking medicine to relieve pain. °· Do not use tobacco products, including cigarettes, chewing tobacco, or e-cigarettes. If you need help quitting, ask your health care provider. °Eating and drinking °· Drink at least 8 eight-ounce glasses of water every day unless you are told not to by your health care provider. If you choose to breastfeed your baby, you may need to drink more water than this. °· Eat high-fiber foods every day. These foods may help prevent or relieve constipation. High-fiber foods include: °? Whole grain cereals and breads. °? Brown rice. °? Beans. °? Fresh fruits and vegetables. °Activity °· Return to your normal activities as told by your health care provider. Ask your health care provider  what activities are safe for you. °· Rest as much as possible. Try to rest or take a nap when your baby is sleeping. °· Do not lift anything that is heavier than your baby or 10 lb (4.5 kg) until your health care provider says that it is safe. °· Talk with your health care provider about when you can engage in sexual activity. This may depend on your: °? Risk of infection. °? Rate of healing. °? Comfort and desire to engage in sexual activity. °Vaginal Care °· If you have an episiotomy or a vaginal tear, check the area every day for signs of infection. Check for: °? More redness, swelling, or pain. °? More fluid or blood. °? Warmth. °? Pus or a bad smell. °· Do not use tampons or douches until your health care provider says this is safe. °· Watch for any blood clots that may pass from your vagina. These may look like clumps of dark red, brown, or black discharge. °General instructions °· Keep your perineum clean and dry as told by your health care provider. °· Wear loose, comfortable clothing. °· Wipe from front to back when you use the toilet. °· Ask your health care provider if you can shower or take a bath. If you had an episiotomy or a perineal tear during labor and delivery, your health care provider may tell you not to take baths for a certain length of time. °· Wear a bra that supports your breasts and fits you well. °· If possible, have someone help you with household activities and help care for your baby for at least a few days after   you leave the hospital. °· Keep all follow-up visits for you and your baby as told by your health care provider. This is important. °Contact a health care provider if: °· You have: °? Vaginal discharge that has a bad smell. °? Difficulty urinating. °? Pain when urinating. °? A sudden increase or decrease in the frequency of your bowel movements. °? More redness, swelling, or pain around your episiotomy or vaginal tear. °? More fluid or blood coming from your episiotomy or  vaginal tear. °? Pus or a bad smell coming from your episiotomy or vaginal tear. °? A fever. °? A rash. °? Little or no interest in activities you used to enjoy. °? Questions about caring for yourself or your baby. °· Your episiotomy or vaginal tear feels warm to the touch. °· Your episiotomy or vaginal tear is separating or does not appear to be healing. °· Your breasts are painful, hard, or turn red. °· You feel unusually sad or worried. °· You feel nauseous or you vomit. °· You pass large blood clots from your vagina. If you pass a blood clot from your vagina, save it to show to your health care provider. Do not flush blood clots down the toilet without having your health care provider look at them. °· You urinate more than usual. °· You are dizzy or light-headed. °· You have not breastfed at all and you have not had a menstrual period for 12 weeks after delivery. °· You have stopped breastfeeding and you have not had a menstrual period for 12 weeks after you stopped breastfeeding. °Get help right away if: °· You have: °? Pain that does not go away or does not get better with medicine. °? Chest pain. °? Difficulty breathing. °? Blurred vision or spots in your vision. °? Thoughts about hurting yourself or your baby. °· You develop pain in your abdomen or in one of your legs. °· You develop a severe headache. °· You faint. °· You bleed from your vagina so much that you fill two sanitary pads in one hour. °This information is not intended to replace advice given to you by your health care provider. Make sure you discuss any questions you have with your health care provider. °Document Released: 03/20/2000 Document Revised: 09/04/2015 Document Reviewed: 04/07/2015 °Elsevier Interactive Patient Education © 2018 [employer] ° °

## 2017-08-10 NOTE — Discharge Summary (Addendum)
OB Discharge Summary     Patient Name: Bethany Gallagher DOB: March 04, 1991 MRN: [id number]  Date of admission: 08/08/2017 Delivering MD: Myrene Buddy   Date of discharge: 08/10/2017  Admitting diagnosis: INDUCTION Intrauterine pregnancy: [redacted]w[redacted]d     Secondary diagnosis:  Active Problems:   NSVD (normal spontaneous vaginal delivery)  Additional problems: none     Discharge diagnosis: Term Pregnancy Delivered                                                                                                Post partum procedures:none  Augmentation: AROM and Pitocin  Complications: None  Hospital course:  Induction of Labor With Vaginal Delivery   27 y.o. yo G5P5005 at [redacted]w[redacted]d was admitted to the hospital 08/08/2017 for induction of labor.  Indication for induction: Postdates.  Patient had an uncomplicated labor course as follows: Membrane Rupture Time/Date: 3:18 PM ,08/08/2017   Intrapartum Procedures: Episiotomy: None [1]                                         Lacerations:  None [1]  Patient had delivery of a Viable infant.  Information for the patient's newborn:  Glayds, Insco [[id number]]  Delivery Method: Vag-Spont   08/08/2017  Details of delivery can be found in separate delivery note.  Patient had a routine postpartum course. Patient is discharged home 08/10/17.  Physical exam  Vitals:   08/09/17 0525 08/09/17 1000 08/09/17 1749 08/10/17 0555  BP: 112/66 99/61 97/68  116/80  Pulse: 81 85 75 80  Resp: Temp: 97.9 F (36.6 C) 98.2 F (36.8 C) 98.1 F (36.7 C) 97.9 F (36.6 C)  TempSrc: Oral Oral Tympanic Oral  SpO2: 96%     Weight:      Height:       General: alert, cooperative and no distress Lochia: appropriate Uterine Fundus: firm Incision: N/A DVT Evaluation: No evidence of DVT seen on physical exam. Negative Homan's sign. Labs: Lab Results  Component Value Date   WBC 5.4 08/08/2017   HGB 10.0 (L) 08/08/2017   HCT 31.3 (L) 08/08/2017   MCV 81.1 08/08/2017   PLT 260 08/08/2017   CMP Latest Ref Rng & Units 01/01/2012  Glucose 70 - 99 mg/dL 92  BUN 6 - 23 mg/dL 6  Creatinine 0.86 - 5.78 mg/dL 4.69  Sodium 629 - 528 mEq/L 136  Potassium 3.5 - 5.1 mEq/L 3.2(L)  Chloride 96 - 112 mEq/L 102  CO2 19 - 32 mEq/L 25  Calcium 8.4 - 10.5 mg/dL 7.4(L)  Total Protein 6.0 - 8.3 g/dL 4.1(L)  Total Bilirubin 0.3 - 1.2 mg/dL 2.4(M)  Alkaline Phos 39 - 117 U/L 181(H)  AST 0 - 37 U/L 43(H)  ALT 0 - 35 U/L 48(H)    Discharge instruction: per After Visit Summary and "Baby and Me Booklet".  After visit meds:  Allergies as of 08/10/2017   No Known Allergies     Medication List  STOP taking these medications   prenatal multivitamin Tabs tablet   senna-docusate 8.6-50 MG tablet Commonly known as:  Senokot-S     TAKE these medications   ibuprofen 600 MG tablet Commonly known as:  ADVIL,MOTRIN Take 1 tablet (600 mg total) by mouth every 6 (six) hours.       Diet: routine diet  Activity: Advance as tolerated. Pelvic rest for 6 weeks.   Outpatient follow up:6 weeks Follow up Appt: Future Appointments  Date Time Provider Department Center  08/23/2017  1:30 PM Anyanwu, Jethro Bastos, MD [facility] None  09/06/2017  1:30 PM Brock Bad, MD [facility] None   Follow up Visit:No follow-ups on file.  Postpartum contraception: interval BTL  Newborn Data: Live born female  Birth Weight: 6 lb 12.5 oz (3076 g) APGAR: 9, 9  Newborn Delivery   Birth date/time:  08/08/2017 17:58:00 Delivery type:  Vaginal, Spontaneous     Baby Feeding: Bottle Disposition:home with mother   08/10/2017 Myrene Buddy, MD   OB FELLOW DISCHARGE ATTESTATION  I have seen and examined this patient. I agree with above documentation and have made edits as needed.   Caryl Ada, DO OB Fellow 2:48 PM

## 2017-08-23 ENCOUNTER — Ambulatory Visit: Payer: Medicaid Other | Admitting: Obstetrics & Gynecology

## 2017-08-23 NOTE — Progress Notes (Deleted)
   Patient did not show up today for her scheduled appointment.   Adnan Vanvoorhis, MD, FACOG Obstetrician & Gynecologist, [facility], [facility]  

## 2017-08-30 ENCOUNTER — Encounter (HOSPITAL_COMMUNITY): Payer: Self-pay | Admitting: *Deleted

## 2017-08-30 ENCOUNTER — Other Ambulatory Visit: Payer: Self-pay

## 2017-08-30 ENCOUNTER — Inpatient Hospital Stay (HOSPITAL_COMMUNITY)
Admission: AD | Admit: 2017-08-30 | Discharge: 2017-08-30 | Disposition: A | Discharge status: Home / Self Care | Payer: Medicaid Other | Source: Ambulatory Visit | Attending: Obstetrics and Gynecology | Admitting: Obstetrics and Gynecology

## 2017-08-30 DIAGNOSIS — Z87891 Personal history of nicotine dependence: Secondary | ICD-10-CM | POA: Diagnosis not present

## 2017-08-30 DIAGNOSIS — A599 Trichomoniasis, unspecified: Secondary | ICD-10-CM

## 2017-08-30 DIAGNOSIS — N898 Other specified noninflammatory disorders of vagina: Secondary | ICD-10-CM | POA: Diagnosis present

## 2017-08-30 DIAGNOSIS — A5901 Trichomonal vulvovaginitis: Secondary | ICD-10-CM | POA: Insufficient documentation

## 2017-08-30 LAB — WET PREP, GENITAL
Clue Cells Wet Prep HPF POC: NONE SEEN
Sperm: NONE SEEN
Yeast Wet Prep HPF POC: NONE SEEN

## 2017-08-30 MED ORDER — METRONIDAZOLE 500 MG PO TABS
2000.0000 mg | ORAL_TABLET | Freq: Once | ORAL | Status: AC
  Administered 2017-08-30: 2000 mg via ORAL
  Filled 2017-08-30: qty 4

## 2017-08-30 NOTE — MAU Provider Note (Signed)
History     CSN: [id number]  Arrival date and time: 08/30/17 1404   None     Chief Complaint  Patient presents with  . Vaginal Discharge   Vaginal Discharge  The patient's primary symptoms include vaginal discharge. This is a new problem. The current episode started in the past 7 days. The problem occurs constantly. The problem has been unchanged. The patient is experiencing no pain. She is not pregnant. Associated symptoms include dysuria. Pertinent negatives include no abdominal pain or fever. The vaginal discharge was milky and malodorous. There has been no bleeding.    Ms.Bethany Gallagher is a 27 y.o. female G5P5005 here in MAU with vaginal discharge. Says she has noticed an abnormal discharge with a fishy odor for 3-4 days. Says the discharge has been there for over a week. Says she has never had this before. Has not tried any medications over the counter. No recent intercourse. Status post vaginal delivery on 5/5. No fever.   OB History    Gravida  5   Para  5   Term  5   Preterm  0   AB  0   Living  5     SAB  0   TAB  0   Ectopic  0   Multiple  0   Live Births  5           Past Medical History:  Diagnosis Date  . Hx of chlamydia infection   . Hx of gonorrhea   . Pregnancy induced hypertension    2013   . Vaginal Pap smear, abnormal     Past Surgical History:  Procedure Laterality Date  . NO PAST SURGERIES      Family History  Problem Relation Age of Onset  . Cancer Maternal Grandfather   . Asthma Brother   . Alcohol abuse Neg Hx   . Diabetes Neg Hx   . Heart disease Neg Hx   . Hypertension Neg Hx   . Stroke Neg Hx     Social History   Tobacco Use  . Smoking status: Former Smoker    Types: Cigarettes  . Smokeless tobacco: Former Neurosurgeon    Quit date: 11/05/2015  . Tobacco comment: with + preg test  Substance Use Topics  . Alcohol use: Not Currently  . Drug use: No    Comment: denies use of marajuana with current pregnancy     Allergies: No Known Allergies  Medications Prior to Admission  Medication Sig Dispense Refill Last Dose  . ibuprofen (ADVIL,MOTRIN) 600 MG tablet Take 1 tablet (600 mg total) by mouth every 6 (six) hours. 30 tablet 0    Results for orders placed or performed during the hospital encounter of 08/30/17 (from the past 48 hour(s))  Wet prep, genital     Status: Abnormal   Collection Time: 08/30/17  2:45 PM  Result Value Ref Range   Yeast Wet Prep HPF POC NONE SEEN NONE SEEN   Trich, Wet Prep PRESENT (A) NONE SEEN   Clue Cells Wet Prep HPF POC NONE SEEN NONE SEEN   WBC, Wet Prep HPF POC MANY (A) NONE SEEN    Comment: FEW BACTERIA SEEN   Sperm NONE SEEN     Comment: Performed at [facility], [address], [place], [place] [address]   Review of Systems  Constitutional: Negative for fever.  Gastrointestinal: Negative for abdominal pain.  Genitourinary: Positive for dysuria and vaginal discharge.   Physical Exam  Blood pressure 115/64, pulse 79, temperature 98.3 F (36.8 C), temperature source Oral, resp. rate 16, weight 138 lb (62.6 kg), SpO2 100 %, not currently breastfeeding.  Physical Exam  Constitutional: She is oriented to person, place, and time. She appears well-developed and well-nourished. No distress.  HENT:  Head: Normocephalic.  Eyes: Pupils are equal, round, and reactive to light.  Genitourinary:  Genitourinary Comments: Bimanual exam: Cervix anterior, slightly open, no CMT Uterus non tender, normal size Adnexa non tender, no masses bilaterally GC/Chlam, wet prep done Chaperone present for exam.   Musculoskeletal: Normal range of motion.  Neurological: She is alert and oriented to person, place, and time.  Skin: Skin is warm. She is not diaphoretic.  Psychiatric: Her behavior is normal.    MAU Course  Procedures  None  MDM  UA Wet prep and GC collected by RN  Flagyl 2 grams given in MAU for the treatment of trichomonas.   Assessment and Plan    A:  1. Trichomonas infection   2. Foul smelling vaginal discharge     P:  Discharge home in stable condition Return to MAU for emergencies only  Condoms always Your partner needs treatment  Kaleisha Bhargava, Harolyn Rutherford, NP 08/30/2017 3:22 PM

## 2017-08-30 NOTE — Discharge Instructions (Signed)
Sexually Transmitted Disease A sexually transmitted disease (STD) is a disease or infection that may be passed (transmitted) from person to person, usually during sexual activity. This may happen by way of saliva, semen, blood, vaginal mucus, or urine. Common STDs include:  Gonorrhea.  Chlamydia.  Syphilis.  HIV and AIDS.  Genital herpes.  Hepatitis B and C.  Trichomonas.  Human papillomavirus (HPV).  Pubic lice.  Scabies.  Mites.  Bacterial vaginosis.  What are the causes? An STD may be caused by bacteria, a virus, or parasites. STDs are often transmitted during sexual activity if one person is infected. However, they may also be transmitted through nonsexual means. STDs may be transmitted after:  Sexual intercourse with an infected person.  Sharing sex toys with an infected person.  Sharing needles with an infected person or using unclean piercing or tattoo needles.  Having intimate contact with the genitals, mouth, or rectal areas of an infected person.  Exposure to infected fluids during birth.  What are the signs or symptoms? Different STDs have different symptoms. Some people may not have any symptoms. If symptoms are present, they may include:  Painful or bloody urination.  Pain in the pelvis, abdomen, vagina, anus, throat, or eyes.  A skin rash, itching, or irritation.  Growths, ulcerations, blisters, or sores in the genital and anal areas.  Abnormal vaginal discharge with or without bad odor.  Penile discharge in men.  Fever.  Pain or bleeding during sexual intercourse.  Swollen glands in the groin area.  Yellow skin and eyes (jaundice). This is seen with hepatitis.  Swollen testicles.  Infertility.  Sores and blisters in the mouth.  How is this diagnosed? To make a diagnosis, your health care provider may:  Take a medical history.  Perform a physical exam.  Take a sample of any discharge to examine.  Swab the throat, cervix,  opening to the penis, rectum, or vagina for testing.  Test a sample of your first morning urine.  Perform blood tests.  Perform a Pap test, if this applies.  Perform a colposcopy.  Perform a laparoscopy.  How is this treated? Treatment depends on the STD. Some STDs may be treated but not cured.  Chlamydia, gonorrhea, trichomonas, and syphilis can be cured with antibiotic medicine.  Genital herpes, hepatitis, and HIV can be treated, but not cured, with prescribed medicines. The medicines lessen symptoms.  Genital warts from HPV can be treated with medicine or by freezing, burning (electrocautery), or surgery. Warts may come back.  HPV cannot be cured with medicine or surgery. However, abnormal areas may be removed from the cervix, vagina, or vulva.  If your diagnosis is confirmed, your recent sexual partners need treatment. This is true even if they are symptom-free or have a negative culture or evaluation. They should not have sex until their health care providers say it is okay.  Your health care provider may test you for infection again 3 months after treatment.  How is this prevented? Take these steps to reduce your risk of getting an STD:  Use latex condoms, dental dams, and water-soluble lubricants during sexual activity. Do not use petroleum jelly or oils.  Avoid having multiple sex partners.  Do not have sex with someone who has other sex partners.  Do not have sex with anyone you do not know or who is at high risk for an STD.  Avoid risky sex practices that can break your skin.  Do not have sex if you have open sores  on your mouth or skin.  Avoid drinking too much alcohol or taking illegal drugs. Alcohol and drugs can affect your judgment and put you in a vulnerable position.  Avoid engaging in oral and anal sex acts.  Get vaccinated for HPV and hepatitis. If you have not received these vaccines in the past, talk to your health care provider about whether one or  both might be right for you.  If you are at risk of being infected with HIV, it is recommended that you take a prescription medicine daily to prevent HIV infection. This is called pre-exposure prophylaxis (PrEP). You are considered at risk if: ? You are a man who has sex with other men (MSM). ? You are a heterosexual man or woman and are sexually active with more than one partner. ? You take drugs by injection. ? You are sexually active with a partner who has HIV.  Talk with your health care provider about whether you are at high risk of being infected with HIV. If you choose to begin PrEP, you should first be tested for HIV. You should then be tested every 3 months for as long as you are taking PrEP.  Contact a health care provider if:  See your health care provider.  Tell your sexual partner(s). They should be tested and treated for any STDs.  Do not have sex until your health care provider says it is okay. Get help right away if: Contact your health care provider right away if:  You have severe abdominal pain.  You are a man and notice swelling or pain in your testicles.  You are a woman and notice swelling or pain in your vagina.  This information is not intended to replace advice given to you by your health care provider. Make sure you discuss any questions you have with your health care provider. Document Released: 06/13/2002 Document Revised: 10/11/2015 Document Reviewed: 10/11/2012 Elsevier Interactive Patient Education  2018 [employer].   Trichomoniasis Trichomoniasis is an STI (sexually transmitted infection) that can affect both women and men. In women, the outer area of the female genitalia (vulva) and the vagina are affected. In men, the penis is mainly affected, but the prostate and other reproductive organs can also be involved. This condition can be treated with medicine. It often has no symptoms (is asymptomatic), especially in men. What are the causes? This  condition is caused by an organism called Trichomonas vaginalis. Trichomoniasis most often spreads from person to person (is contagious) through sexual contact. What increases the risk? The following factors may make you more likely to develop this condition:  Having unprotected sexual intercourse.  Having sexual intercourse with a partner who has trichomoniasis.  Having multiple sexual partners.  Having had previous trichomoniasis infections or other STIs.  What are the signs or symptoms? In women, symptoms of trichomoniasis include:  Abnormal vaginal discharge that is clear, white, gray, or yellow-green and foamy and has an unusual "fishy" odor.  Itching and irritation of the vagina and vulva.  Burning or pain during urination or sexual intercourse.  Genital redness and swelling.  In men, symptoms of trichomoniasis include:  Penile discharge that may be foamy or contain pus.  Pain in the penis. This may happen only when urinating.  Itching or irritation inside the penis.  Burning after urination or ejaculation.  How is this diagnosed? In women, this condition may be found during a routine Pap test or physical exam. It may be found in men during a  routine physical exam. Your health care provider may perform tests to help diagnose this infection, such as:  Urine tests (men and women).  The following in women: ? Testing the pH of the vagina. ? A vaginal swab test that checks for the Trichomonas vaginalis organism. ? Testing vaginal secretions.  Your health care provider may test you for other STIs, including HIV (human immunodeficiency virus). How is this treated? This condition is treated with medicine taken by mouth (orally), such as metronidazole or tinidazole to fight the infection. Your sexual partner(s) may also need to be tested and treated.  If you are a woman and you plan to become pregnant or think you may be pregnant, tell your health care provider right away.  Some medicines that are used to treat the infection should not be taken during pregnancy.  Your health care provider may recommend over-the-counter medicines or creams to help relieve itching or irritation. You may be tested for infection again 3 months after treatment. Follow these instructions at home:  Take and use over-the-counter and prescription medicines, including creams, only as told by your health care provider.  Do not have sexual intercourse until one week after you finish your medicine, or until your health care provider approves. Ask your health care provider when you may resume sexual intercourse.  (Women) Do not douche or wear tampons while you have the infection.  Discuss your infection with your sexual partner(s). Make sure that your partner gets tested and treated, if necessary.  Keep all follow-up visits as told by your health care provider. This is important. How is this prevented?  Use condoms every time you have sex. Using condoms correctly and consistently can help protect against STIs.  Avoid having multiple sexual partners.  Talk with your sexual partner about any symptoms that either of you may have, as well as any history of STIs.  Get tested for STIs and STDs (sexually transmitted diseases) before you have sex. Ask your partner to do the same.  Do not have sexual contact if you have symptoms of trichomoniasis or another STI. Contact a health care provider if:  You still have symptoms after you finish your medicine.  You develop pain in your abdomen.  You have pain when you urinate.  You have bleeding after sexual intercourse.  You develop a rash.  You feel nauseous or you vomit.  You plan to become pregnant or think you may be pregnant. Summary  Trichomoniasis is an STI (sexually transmitted infection) that can affect both women and men.  This condition often has no symptoms (is asymptomatic), especially in men.  You should not have sexual  intercourse until one week after you finish your medicine, or until your health care provider approves. Ask your health care provider when you may resume sexual intercourse.  Discuss your infection with your sexual partner. Make sure that your partner gets tested and treated, if necessary. This information is not intended to replace advice given to you by your health care provider. Make sure you discuss any questions you have with your health care provider. Document Released: 09/16/2000 Document Revised: 02/14/2016 Document Reviewed: 02/14/2016 Elsevier Interactive Patient Education  2017 [employer].

## 2017-08-30 NOTE — MAU Note (Signed)
Thinks she has a yeast infection or something. Her vagina is just not right, has a fishy odor. Noted it about 3-4 days ago.  No itching or irritation.  Noted a slimy yellow d/c yesterday.  Vag delivery 5/5

## 2017-08-31 LAB — GC/CHLAMYDIA PROBE AMP (~~LOC~~) NOT AT ARMC
Chlamydia: NEGATIVE
Neisseria Gonorrhea: NEGATIVE

## 2017-09-06 ENCOUNTER — Ambulatory Visit: Payer: Medicaid Other | Admitting: Obstetrics

## 2017-09-14 ENCOUNTER — Ambulatory Visit: Payer: Medicaid Other | Admitting: Obstetrics

## 2017-10-06 ENCOUNTER — Ambulatory Visit: Payer: Medicaid Other | Admitting: Obstetrics

## 2017-11-11 ENCOUNTER — Inpatient Hospital Stay (HOSPITAL_COMMUNITY)
Admission: AD | Admit: 2017-11-11 | Discharge: 2017-11-12 | Disposition: A | Discharge status: Home / Self Care | Payer: Medicaid Other | Source: Ambulatory Visit | Attending: Obstetrics and Gynecology | Admitting: Obstetrics and Gynecology

## 2017-11-11 ENCOUNTER — Inpatient Hospital Stay (HOSPITAL_COMMUNITY)
Admission: AD | Admit: 2017-11-11 | Discharge: 2017-11-11 | Discharge status: Against Medical Advice | Payer: Medicaid Other | Source: Ambulatory Visit | Attending: Obstetrics & Gynecology | Admitting: Obstetrics & Gynecology

## 2017-11-11 DIAGNOSIS — Z87891 Personal history of nicotine dependence: Secondary | ICD-10-CM | POA: Insufficient documentation

## 2017-11-11 DIAGNOSIS — N898 Other specified noninflammatory disorders of vagina: Secondary | ICD-10-CM

## 2017-11-11 DIAGNOSIS — Z791 Long term (current) use of non-steroidal anti-inflammatories (NSAID): Secondary | ICD-10-CM | POA: Insufficient documentation

## 2017-11-11 NOTE — MAU Note (Signed)
Pt called, not in lobby.  2nd call.

## 2017-11-11 NOTE — Progress Notes (Signed)
1756--1st call.  Patient not in lobby.

## 2017-11-11 NOTE — MAU Note (Signed)
Third call, pt not in lobby.  Did not inform staff she was leaving.

## 2017-11-12 ENCOUNTER — Inpatient Hospital Stay (HOSPITAL_COMMUNITY)
Admission: AD | Admit: 2017-11-12 | Discharge: 2017-11-12 | Disposition: A | Discharge status: Home / Self Care | Payer: Medicaid Other | Source: Ambulatory Visit | Attending: Obstetrics and Gynecology | Admitting: Obstetrics and Gynecology

## 2017-11-12 ENCOUNTER — Inpatient Hospital Stay (HOSPITAL_COMMUNITY)
Admission: AD | Admit: 2017-11-12 | Discharge: 2017-11-12 | Discharge status: Against Medical Advice | Payer: Medicaid Other | Source: Ambulatory Visit | Attending: Obstetrics and Gynecology | Admitting: Obstetrics and Gynecology

## 2017-11-12 DIAGNOSIS — Z87891 Personal history of nicotine dependence: Secondary | ICD-10-CM | POA: Diagnosis not present

## 2017-11-12 DIAGNOSIS — N898 Other specified noninflammatory disorders of vagina: Secondary | ICD-10-CM

## 2017-11-12 DIAGNOSIS — Z791 Long term (current) use of non-steroidal anti-inflammatories (NSAID): Secondary | ICD-10-CM | POA: Diagnosis not present

## 2017-11-12 LAB — URINALYSIS, ROUTINE W REFLEX MICROSCOPIC
Bilirubin Urine: NEGATIVE
Glucose, UA: NEGATIVE mg/dL
Hgb urine dipstick: NEGATIVE
Ketones, ur: 5 mg/dL — AB
Leukocytes, UA: NEGATIVE
Nitrite: NEGATIVE
Protein, ur: NEGATIVE mg/dL
Specific Gravity, Urine: 1.034 — ABNORMAL HIGH (ref 1.005–1.030)
pH: 6 (ref 5.0–8.0)

## 2017-11-12 LAB — WET PREP, GENITAL
Clue Cells Wet Prep HPF POC: NONE SEEN
Sperm: NONE SEEN
Trich, Wet Prep: NONE SEEN
Yeast Wet Prep HPF POC: NONE SEEN

## 2017-11-12 LAB — GC/CHLAMYDIA PROBE AMP (~~LOC~~) NOT AT ARMC
Chlamydia: POSITIVE — AB
Neisseria Gonorrhea: NEGATIVE

## 2017-11-12 LAB — POCT PREGNANCY, URINE: Preg Test, Ur: NEGATIVE

## 2017-11-12 MED ORDER — CLINDAMYCIN PHOSPHATE 1 % EX GEL: Freq: Two times a day (BID) | CUTANEOUS | 11 refills | Status: DC

## 2017-11-12 NOTE — MAU Note (Signed)
2nd call, pt not in lobby 

## 2017-11-12 NOTE — MAU Note (Signed)
Third call, pt not in lobby, did not inform staff she was leaving. 

## 2017-11-12 NOTE — MAU Note (Signed)
Pt called and not in lobby 

## 2017-11-12 NOTE — MAU Note (Signed)
Pt states she received a phone call to come to MAU for treatment for chlamydia.  Was seen here during the night.

## 2017-11-12 NOTE — MAU Note (Addendum)
Pt reports white discharge but has an odor for 2 weeks. States that it itches a little. Reports using a vaginal cream for the smell today. Pt denies vaginal bleeding. LMP: 10/26/2017.

## 2017-11-12 NOTE — MAU Provider Note (Signed)
Chief Complaint:  No chief complaint on file.   First Provider Initiated Contact with Patient 11/12/17 0035      HPI: Bethany Gallagher is a 27 y.o. [id number] who presents to maternity admissions reporting vaginal discharge with odor for the past 2 weeks. Some itching.  Using herbal cream from store with no relief.  . She reports no vaginal bleeding, urinary symptoms, h/a, dizziness, n/v, or fever/chills.    Vaginal Discharge  The patient's primary symptoms include genital itching, a genital odor and vaginal discharge. The patient's pertinent negatives include no genital lesions, missed menses, pelvic pain or vaginal bleeding. This is a new problem. The current episode started 1 to 4 weeks ago. The problem occurs constantly. The problem has been unchanged. The patient is experiencing no pain. She is not pregnant. Pertinent negatives include no abdominal pain, back pain or fever. The vaginal discharge was milky. There has been no bleeding. She has not been passing clots. She has not been passing tissue. Nothing aggravates the symptoms. Treatments tried: OTC herbal cream. The treatment provided no relief. She is sexually active. She uses progestin injections for contraception. Her menstrual history has been irregular.   RN Note: Pt reports white discharge but has an odor for 2 weeks. States that it itches a little. Reports using a vaginal cream for the smell today. Pt denies vaginal bleeding. LMP: 10/26/2017.   Past Medical History: Past Medical History:  Diagnosis Date  . Hx of chlamydia infection   . Hx of gonorrhea   . Pregnancy induced hypertension    2013   . Vaginal Pap smear, abnormal     Past obstetric history: OB History  Gravida Para Term Preterm AB Living  5 5 5  0 0 5  SAB TAB Ectopic Multiple Live Births  0 0 0 0 5    # Outcome Date GA Lbr Len/2nd Weight Sex Delivery Anes PTL Lv  5 Term 08/08/17 [redacted]w[redacted]d 02:37 / 00:03 3076 g Genella Mech EPI  LIV  4 Term 06/09/16 [redacted]w[redacted]d 20:16 /  00:07 3165 g M Vag-Spont EPI  LIV  3 Term 11/23/14 [redacted]w[redacted]d 07:07 / 00:11 3300 g M Vag-Spont EPI  LIV  2 Term 12/31/11 [redacted]w[redacted]d 03:12 / 00:10 2075 g F Vag-Spont EPI  LIV  1 Term 2012 [redacted]w[redacted]d 48:00 2438 g F Vag-Spont EPI  LIV    Past Surgical History: Past Surgical History:  Procedure Laterality Date  . NO PAST SURGERIES      Family History: Family History  Problem Relation Age of Onset  . Cancer Maternal Grandfather   . Asthma Brother   . Alcohol abuse Neg Hx   . Diabetes Neg Hx   . Heart disease Neg Hx   . Hypertension Neg Hx   . Stroke Neg Hx     Social History: Social History   Tobacco Use  . Smoking status: Former Smoker    Types: Cigarettes  . Smokeless tobacco: Former Neurosurgeon    Quit date: 11/05/2015  . Tobacco comment: with + preg test  Substance Use Topics  . Alcohol use: Not Currently  . Drug use: No    Comment: denies use of marajuana with current pregnancy    Allergies: No Known Allergies  Meds:  Medications Prior to Admission  Medication Sig Dispense Refill Last Dose  . ibuprofen (ADVIL,MOTRIN) 600 MG tablet Take 1 tablet (600 mg total) by mouth every 6 (six) hours. 30 tablet 0 Past Month at Unknown time  . Prenatal Vit-Fe Fumarate-FA (PRENATAL  MULTIVITAMIN) TABS tablet Take 1 tablet by mouth daily at 12 noon.   Past Month at Unknown time    I have reviewed patient's Past Medical Hx, Surgical Hx, Family Hx, Social Hx, medications and allergies.  ROS:  Review of Systems  Constitutional: Negative for fever.  Gastrointestinal: Negative for abdominal pain.  Genitourinary: Positive for vaginal discharge. Negative for missed menses and pelvic pain.  Musculoskeletal: Negative for back pain.   Other systems negative     Physical Exam   Patient Vitals for the past 24 hrs:  BP Temp Temp src Pulse Resp SpO2 Height Weight  11/12/17 0003 107/65 98.6 F (37 C) Oral 79 16 100 % 5\' 6"  (1.676 m) 62.6 kg   Constitutional: Well-developed, well-nourished female in no  acute distress.  Cardiovascular: normal rate and rhythm Respiratory: normal effort, no distress.  GI: Abd soft, non-tender.  Nondistended MS: Extremities nontender, no edema, normal ROM Neurologic: Alert and oriented x 4.   Grossly nonfocal. GU: Neg CVAT. Skin:  Warm and Dry Psych:  Affect appropriate.  PELVIC EXAM: Deferred.  Swabs sent for cultures and wet prep.    Labs: Results for orders placed or performed during the hospital encounter of 11/11/17 (from the past 24 hour(s))  Wet prep, genital     Status: Abnormal   Collection Time: 11/12/17 12:10 AM  Result Value Ref Range   Yeast Wet Prep HPF POC NONE SEEN NONE SEEN   Trich, Wet Prep NONE SEEN NONE SEEN   Clue Cells Wet Prep HPF POC NONE SEEN NONE SEEN   WBC, Wet Prep HPF POC FEW (A) NONE SEEN   Sperm NONE SEEN   Urinalysis, Routine w reflex microscopic     Status: Abnormal   Collection Time: 11/12/17 12:17 AM  Result Value Ref Range   Color, Urine YELLOW YELLOW   APPearance CLEAR CLEAR   Specific Gravity, Urine 1.034 (H) 1.005 - 1.030   pH 6.0 5.0 - 8.0   Glucose, UA NEGATIVE NEGATIVE mg/dL   Hgb urine dipstick NEGATIVE NEGATIVE   Bilirubin Urine NEGATIVE NEGATIVE   Ketones, ur 5 (A) NEGATIVE mg/dL   Protein, ur NEGATIVE NEGATIVE mg/dL   Nitrite NEGATIVE NEGATIVE   Leukocytes, UA NEGATIVE NEGATIVE  Pregnancy, urine POC     Status: None   Collection Time: 11/12/17 12:19 AM  Result Value Ref Range   Preg Test, Ur NEGATIVE NEGATIVE   --/--/O POS (05/05 [id number])  Imaging:  No results found.  MAU Course/MDM: I have ordered labs as follows: UPT neg, UA normal, Wet prep negative Imaging ordered: none Results reviewed.   Discussed negative test for BV, but since she has an odor, will try Clinda cream. Treatments in MAU included none.   Pt stable at time of discharge.  Assessment: Vaginal discharge - Plan: Discharge patient  Vaginal odor - Plan: Discharge patient   Plan: Discharge home Recommend good perineal  hygeine Rx sent for Clindamycin cream for vaginal odor, ?BV  Encouraged to return here or to other Urgent Care/ED if she develops worsening of symptoms, increase in pain, fever, or other concerning symptoms.   Wynelle BourgeoisMarie Kymorah Korf CNM, MSN Certified Nurse-Midwife 11/12/2017 12:35 AM

## 2017-11-12 NOTE — Discharge Instructions (Signed)

## 2017-11-12 NOTE — MAU Note (Signed)
Pt called, not in lobby 

## 2017-11-15 ENCOUNTER — Telehealth: Payer: Self-pay | Admitting: Student

## 2017-11-15 NOTE — Telephone Encounter (Signed)
Patient requesting tx for chlamydia. Was previously contacted regarding results per notes in Care Everywhere. Written prescription for 1 gm Azithromycin provided to patient.   Judeth HornLawrence, Ayham Word, NP

## 2017-11-23 ENCOUNTER — Encounter (HOSPITAL_COMMUNITY): Payer: Self-pay

## 2017-11-23 ENCOUNTER — Inpatient Hospital Stay (HOSPITAL_COMMUNITY)
Admission: AD | Admit: 2017-11-23 | Discharge: 2017-11-24 | Disposition: A | Discharge status: Home / Self Care | Payer: Medicaid Other | Source: Ambulatory Visit | Attending: Obstetrics and Gynecology | Admitting: Obstetrics and Gynecology

## 2017-11-23 DIAGNOSIS — Z87891 Personal history of nicotine dependence: Secondary | ICD-10-CM | POA: Diagnosis not present

## 2017-11-23 DIAGNOSIS — R1013 Epigastric pain: Secondary | ICD-10-CM | POA: Diagnosis not present

## 2017-11-23 DIAGNOSIS — Z3202 Encounter for pregnancy test, result negative: Secondary | ICD-10-CM | POA: Diagnosis not present

## 2017-11-23 DIAGNOSIS — K219 Gastro-esophageal reflux disease without esophagitis: Secondary | ICD-10-CM

## 2017-11-23 DIAGNOSIS — R1011 Right upper quadrant pain: Secondary | ICD-10-CM | POA: Insufficient documentation

## 2017-11-23 LAB — URINALYSIS, ROUTINE W REFLEX MICROSCOPIC
Bacteria, UA: NONE SEEN
Glucose, UA: NEGATIVE mg/dL
Hgb urine dipstick: NEGATIVE
Ketones, ur: 5 mg/dL — AB
Nitrite: NEGATIVE
Protein, ur: 30 mg/dL — AB
Specific Gravity, Urine: 1.035 — ABNORMAL HIGH (ref 1.005–1.030)
pH: 5 (ref 5.0–8.0)

## 2017-11-23 LAB — POCT PREGNANCY, URINE: Preg Test, Ur: NEGATIVE

## 2017-11-23 NOTE — MAU Note (Signed)
Pt reports upper abdominal cramping x2 weeks. States she has not taken anything for it or seen anyone for it. States the pain is worse tonight. Pt denies vaginal bleeding or discharge. LMP: end of May. Pt has irregular periods.

## 2017-11-24 ENCOUNTER — Encounter (HOSPITAL_COMMUNITY): Payer: Self-pay

## 2017-11-24 DIAGNOSIS — K219 Gastro-esophageal reflux disease without esophagitis: Secondary | ICD-10-CM

## 2017-11-24 DIAGNOSIS — R1013 Epigastric pain: Secondary | ICD-10-CM

## 2017-11-24 LAB — COMPREHENSIVE METABOLIC PANEL
ALT: 43 U/L (ref 0–44)
AST: 42 U/L — ABNORMAL HIGH (ref 15–41)
Albumin: 4.1 g/dL (ref 3.5–5.0)
Alkaline Phosphatase: 78 U/L (ref 38–126)
Anion gap: 11 (ref 5–15)
BUN: 10 mg/dL (ref 6–20)
CO2: 24 mmol/L (ref 22–32)
Calcium: 9.1 mg/dL (ref 8.9–10.3)
Chloride: 102 mmol/L (ref 98–111)
Creatinine, Ser: 0.84 mg/dL (ref 0.44–1.00)
GFR calc Af Amer: >60 mL/min (ref >60)
GFR calc non Af Amer: >60 mL/min (ref >60)
Glucose, Bld: 88 mg/dL (ref 70–99)
Potassium: 3.4 mmol/L — ABNORMAL LOW (ref 3.5–5.1)
Sodium: 137 mmol/L (ref 135–145)
Total Bilirubin: 0.4 mg/dL (ref 0.3–1.2)
Total Protein: 7.4 g/dL (ref 6.5–8.1)

## 2017-11-24 LAB — CBC WITH DIFFERENTIAL/PLATELET
Basophils Absolute: 0 10*3/uL (ref 0.0–0.1)
Basophils Relative: 1 %
Eosinophils Absolute: 0.1 10*3/uL (ref 0.0–0.7)
Eosinophils Relative: 1 %
HCT: 32.3 % — ABNORMAL LOW (ref 36.0–46.0)
Hemoglobin: 10.9 g/dL — ABNORMAL LOW (ref 12.0–15.0)
Lymphocytes Relative: 50 %
Lymphs Abs: 2.1 10*3/uL (ref 0.7–4.0)
MCH: 27.9 pg (ref 26.0–34.0)
MCHC: 33.7 g/dL (ref 30.0–36.0)
MCV: 82.8 fL (ref 78.0–100.0)
Monocytes Absolute: 0.2 10*3/uL (ref 0.1–1.0)
Monocytes Relative: 4 %
Neutro Abs: 1.9 10*3/uL (ref 1.7–7.7)
Neutrophils Relative %: 44 %
Platelets: 268 10*3/uL (ref 150–400)
RBC: 3.9 MIL/uL (ref 3.87–5.11)
RDW: 15.2 % (ref 11.5–15.5)
WBC: 4.3 10*3/uL (ref 4.0–10.5)

## 2017-11-24 LAB — AMYLASE: Amylase: 80 U/L (ref 28–100)

## 2017-11-24 LAB — LIPASE, BLOOD: Lipase: 34 U/L (ref 11–51)

## 2017-11-24 MED ORDER — RANITIDINE HCL 300 MG PO TABS: 300.0000 mg | ORAL_TABLET | Freq: Every day | ORAL | 0 refills | Status: DC

## 2017-11-24 MED ORDER — GI COCKTAIL ~~LOC~~
30.0000 mL | Freq: Once | ORAL | Status: AC
  Administered 2017-11-24: 30 mL via ORAL
  Filled 2017-11-24: qty 30

## 2017-11-24 NOTE — Discharge Instructions (Signed)
Primary care follow up  [facility] (will see you even if you do not have sickle cell): [phone] [facility]: [phone] [facility]: [phone]   In late 2019, [facility]. At that time, the MAU (Maternity Admissions Unit), where you are being seen today, will no longer take care of non-pregnant patients. We strongly encourage you to find a doctor's office before that time, so that you can be seen with any GYN concerns, like vaginal discharge, urinary tract infection, etc.. in a timely manner.  In order to make an office visit more convenient, the Center for [facility] at [facility] will be offering evening hours with same-day appointments, walk-in appointments and scheduled appointments available during this time.  Center for [facility] @ [facility] Hours: Monday - 8am - 7:30 pm with walk-in between 4pm- 7:30 pm Tuesday - 8 am - 5 pm (starting 07/06/17 we will be open late and accepting walk-ins from 4pm - 7:30pm) Wednesday - 8 am - 5 pm (starting 10/06/17 we will be open late and accepting walk-ins from 4pm - 7:30pm) Thursday 8 am - 5 pm (starting 01/06/18 we will be open late and accepting walk-ins from 4pm - 7:30pm) Friday 8 am - 5 pm  For an appointment please call the Center for [facility] at [phone]  For urgent needs, [facility] is also available for management of urgent GYN complaints such as vaginal discharge or urinary tract infections.

## 2017-11-24 NOTE — MAU Provider Note (Signed)
History     CSN: [id number]  Arrival date and time: 11/23/17 2227   First Provider Initiated Contact with Patient 11/24/17 0216      Chief Complaint  Patient presents with  . Abdominal Pain   Abdominal Pain  This is a new problem. Episode onset: 3 weeks ago. The onset quality is sudden. The problem occurs intermittently. The problem has been gradually worsening. The pain is located in the epigastric region. The pain is at a severity of 9/10. The quality of the pain is sharp. The abdominal pain radiates to the epigastric region and RUQ. Pertinent negatives include no dysuria, fever, frequency, nausea or vomiting. The pain is aggravated by movement. The pain is relieved by certain positions. She has tried nothing for the symptoms.    Past Medical History:  Diagnosis Date  . Hx of chlamydia infection   . Hx of gonorrhea   . Pregnancy induced hypertension    2013   . Vaginal Pap smear, abnormal     Past Surgical History:  Procedure Laterality Date  . NO PAST SURGERIES      Family History  Problem Relation Age of Onset  . Cancer Maternal Grandfather   . Asthma Brother   . Alcohol abuse Neg Hx   . Diabetes Neg Hx   . Heart disease Neg Hx   . Hypertension Neg Hx   . Stroke Neg Hx     Social History   Tobacco Use  . Smoking status: Former Smoker    Types: Cigarettes  . Smokeless tobacco: Former NeurosurgeonUser    Quit date: 11/05/2015  . Tobacco comment: with + preg test  Substance Use Topics  . Alcohol use: Not Currently  . Drug use: No    Comment: denies use of marajuana with current pregnancy    Allergies: No Known Allergies  Medications Prior to Admission  Medication Sig Dispense Refill Last Dose  . clindamycin (CLINDAGEL) 1 % gel Apply topically 2 (two) times daily. 30 g 11   . ibuprofen (ADVIL,MOTRIN) 600 MG tablet Take 1 tablet (600 mg total) by mouth every 6 (six) hours. 30 tablet 0 Past Month at Unknown time  . Prenatal Vit-Fe Fumarate-FA (PRENATAL MULTIVITAMIN)  TABS tablet Take 1 tablet by mouth daily at 12 noon.   Past Month at Unknown time    Review of Systems  Constitutional: Negative for chills and fever.  Gastrointestinal: Positive for abdominal pain. Negative for nausea and vomiting.  Genitourinary: Negative for dysuria, frequency, pelvic pain, vaginal bleeding and vaginal discharge.   Physical Exam   Blood pressure 119/67, pulse 84, temperature 97.9 F (36.6 C), temperature source Oral, resp. rate 18, height 5\' 6"  (1.676 m), weight 59 kg, last menstrual period 10/26/2017, SpO2 99 %, not currently breastfeeding.  Physical Exam  Nursing note and vitals reviewed. Constitutional: She is oriented to person, place, and time. She appears well-developed and well-nourished. No distress.  HENT:  Head: Normocephalic.  Cardiovascular: Normal rate.  Respiratory: Effort normal.  GI: Soft. There is no tenderness. There is no rebound.  Neurological: She is alert and oriented to person, place, and time.  Skin: Skin is warm and dry.  Psychiatric: She has a normal mood and affect.     Results for orders placed or performed during the hospital encounter of 11/23/17 (from the past 24 hour(s))  Urinalysis, Routine w reflex microscopic     Status: Abnormal   Collection Time: 11/23/17 10:46 PM  Result Value Ref Range   Color, Urine  AMBER (A) YELLOW   APPearance CLEAR CLEAR   Specific Gravity, Urine 1.035 (H) 1.005 - 1.030   pH 5.0 5.0 - 8.0   Glucose, UA NEGATIVE NEGATIVE mg/dL   Hgb urine dipstick NEGATIVE NEGATIVE   Bilirubin Urine SMALL (A) NEGATIVE   Ketones, ur 5 (A) NEGATIVE mg/dL   Protein, ur 30 (A) NEGATIVE mg/dL   Nitrite NEGATIVE NEGATIVE   Leukocytes, UA TRACE (A) NEGATIVE   RBC / HPF 11-20 0 - 5 RBC/hpf   WBC, UA 6-10 0 - 5 WBC/hpf   Bacteria, UA NONE SEEN NONE SEEN   Squamous Epithelial / LPF 0-5 0 - 5   Mucus PRESENT   Pregnancy, urine POC     Status: None   Collection Time: 11/23/17 11:45 PM  Result Value Ref Range   Preg  Test, Ur NEGATIVE NEGATIVE  CBC with Differential/Platelet     Status: Abnormal   Collection Time: 11/24/17  2:30 AM  Result Value Ref Range   WBC 4.3 4.0 - 10.5 K/uL   RBC 3.90 3.87 - 5.11 MIL/uL   Hemoglobin 10.9 (L) 12.0 - 15.0 g/dL   HCT 16.132.3 (L) 09.636.0 - 04.546.0 %   MCV 82.8 78.0 - 100.0 fL   MCH 27.9 26.0 - 34.0 pg   MCHC 33.7 30.0 - 36.0 g/dL   RDW 40.915.2 81.111.5 - 91.415.5 %   Platelets 268 150 - 400 K/uL   Neutrophils Relative % 44 %   Neutro Abs 1.9 1.7 - 7.7 K/uL   Lymphocytes Relative 50 %   Lymphs Abs 2.1 0.7 - 4.0 K/uL   Monocytes Relative 4 %   Monocytes Absolute 0.2 0.1 - 1.0 K/uL   Eosinophils Relative 1 %   Eosinophils Absolute 0.1 0.0 - 0.7 K/uL   Basophils Relative 1 %   Basophils Absolute 0.0 0.0 - 0.1 K/uL  Comprehensive metabolic panel     Status: Abnormal   Collection Time: 11/24/17  2:30 AM  Result Value Ref Range   Sodium 137 135 - 145 mmol/L   Potassium 3.4 (L) 3.5 - 5.1 mmol/L   Chloride 102 98 - 111 mmol/L   CO2 24 22 - 32 mmol/L   Glucose, Bld 88 70 - 99 mg/dL   BUN 10 6 - 20 mg/dL   Creatinine, Ser 7.820.84 0.44 - 1.00 mg/dL   Calcium 9.1 8.9 - 95.610.3 mg/dL   Total Protein 7.4 6.5 - 8.1 g/dL   Albumin 4.1 3.5 - 5.0 g/dL   AST 42 (H) 15 - 41 U/L   ALT 43 0 - 44 U/L   Alkaline Phosphatase 78 38 - 126 U/L   Total Bilirubin 0.4 0.3 - 1.2 mg/dL   GFR calc non Af Amer >60 >60 mL/min   GFR calc Af Amer >60 >60 mL/min   Anion gap 11 5 - 15  Amylase     Status: None   Collection Time: 11/24/17  2:30 AM  Result Value Ref Range   Amylase 80 28 - 100 U/L  Lipase, blood     Status: None   Collection Time: 11/24/17  2:30 AM  Result Value Ref Range   Lipase 34 11 - 51 U/L    MAU Course  Procedures  MDM   Assessment and Plan   1. Gastroesophageal reflux disease, esophagitis presence not specified   2. Epigastric pain    DC home Comfort measures reviewed  RX: zantac 300mg  QD #30  Return to MAU as needed FU with PCP  Follow-up Information     Department, [facility] Follow up.   Contact information: [address] [place] [address] [phone]            Thressa Sheller 11/24/2017, 2:17 AM

## 2017-12-18 ENCOUNTER — Emergency Department (HOSPITAL_COMMUNITY)
Admission: EM | Admit: 2017-12-18 | Discharge: 2017-12-18 | Disposition: A | Discharge status: Home / Self Care | Payer: Medicaid Other | Attending: Emergency Medicine | Admitting: Emergency Medicine

## 2017-12-18 ENCOUNTER — Encounter (HOSPITAL_COMMUNITY): Payer: Self-pay | Admitting: *Deleted

## 2017-12-18 DIAGNOSIS — Z5321 Procedure and treatment not carried out due to patient leaving prior to being seen by health care provider: Secondary | ICD-10-CM | POA: Diagnosis not present

## 2017-12-18 DIAGNOSIS — R109 Unspecified abdominal pain: Secondary | ICD-10-CM | POA: Insufficient documentation

## 2017-12-18 LAB — CBC
HCT: 36.7 % (ref 36.0–46.0)
Hemoglobin: 11.1 g/dL — ABNORMAL LOW (ref 12.0–15.0)
MCH: 27.2 pg (ref 26.0–34.0)
MCHC: 30.2 g/dL (ref 30.0–36.0)
MCV: 90 fL (ref 78.0–100.0)
Platelets: 328 10*3/uL (ref 150–400)
RBC: 4.08 MIL/uL (ref 3.87–5.11)
RDW: 13.9 % (ref 11.5–15.5)
WBC: 4.4 10*3/uL (ref 4.0–10.5)

## 2017-12-18 LAB — I-STAT BETA HCG BLOOD, ED (MC, WL, AP ONLY): I-stat hCG, quantitative: <5 m[IU]/mL (ref <5)

## 2017-12-18 LAB — COMPREHENSIVE METABOLIC PANEL
ALT: 12 U/L (ref 0–44)
AST: 15 U/L (ref 15–41)
Albumin: 3.9 g/dL (ref 3.5–5.0)
Alkaline Phosphatase: 81 U/L (ref 38–126)
Anion gap: 8 (ref 5–15)
BUN: 8 mg/dL (ref 6–20)
CO2: 25 mmol/L (ref 22–32)
Calcium: 9.2 mg/dL (ref 8.9–10.3)
Chloride: 104 mmol/L (ref 98–111)
Creatinine, Ser: 0.86 mg/dL (ref 0.44–1.00)
GFR calc Af Amer: >60 mL/min (ref >60)
GFR calc non Af Amer: >60 mL/min (ref >60)
Glucose, Bld: 88 mg/dL (ref 70–99)
Potassium: 4.4 mmol/L (ref 3.5–5.1)
Sodium: 137 mmol/L (ref 135–145)
Total Bilirubin: 0.4 mg/dL (ref 0.3–1.2)
Total Protein: 7.2 g/dL (ref 6.5–8.1)

## 2017-12-18 LAB — LIPASE, BLOOD: Lipase: 36 U/L (ref 11–51)

## 2017-12-18 NOTE — ED Notes (Addendum)
Pt seen walking to the restroom with urine cup and her belongings after NT, [place] brought her back to her room. About 8 minutes later, this tech went into pts room to hook her up to the monitor. Pt was not in room, nor her belongings. Pt was not found in the restroom either.

## 2017-12-18 NOTE — ED Notes (Addendum)
Pt aware of need for urine sample, specimen cup provided to pt

## 2017-12-18 NOTE — ED Notes (Signed)
Called for pt x2, no answer, found pt stickers and bp cuff balled up on floor

## 2017-12-18 NOTE — ED Triage Notes (Signed)
Pt in c/o abdominal pain that is upper, describes as cramping, reports nausea, denies vomiting, states this has been going on for awhile, no distress noted

## 2018-02-04 DIAGNOSIS — R1011 Right upper quadrant pain: Secondary | ICD-10-CM

## 2018-02-04 HISTORY — DX: Right upper quadrant pain: R10.11

## 2018-02-07 ENCOUNTER — Emergency Department (HOSPITAL_COMMUNITY)
Admission: EM | Admit: 2018-02-07 | Discharge: 2018-02-07 | Disposition: A | Discharge status: Home / Self Care | Payer: Medicaid Other | Attending: Emergency Medicine | Admitting: Emergency Medicine

## 2018-02-07 ENCOUNTER — Encounter (HOSPITAL_COMMUNITY): Payer: Self-pay | Admitting: Emergency Medicine

## 2018-02-07 DIAGNOSIS — K2900 Acute gastritis without bleeding: Secondary | ICD-10-CM | POA: Diagnosis not present

## 2018-02-07 DIAGNOSIS — R112 Nausea with vomiting, unspecified: Secondary | ICD-10-CM | POA: Diagnosis not present

## 2018-02-07 DIAGNOSIS — R1084 Generalized abdominal pain: Secondary | ICD-10-CM | POA: Diagnosis present

## 2018-02-07 LAB — CBC
HCT: 34.5 % — ABNORMAL LOW (ref 36.0–46.0)
Hemoglobin: 10.6 g/dL — ABNORMAL LOW (ref 12.0–15.0)
MCH: 27 pg (ref 26.0–34.0)
MCHC: 30.7 g/dL (ref 30.0–36.0)
MCV: 87.8 fL (ref 80.0–100.0)
Platelets: 375 10*3/uL (ref 150–400)
RBC: 3.93 MIL/uL (ref 3.87–5.11)
RDW: 13.9 % (ref 11.5–15.5)
WBC: 5.7 10*3/uL (ref 4.0–10.5)
nRBC: 0 % (ref 0.0–0.2)

## 2018-02-07 LAB — COMPREHENSIVE METABOLIC PANEL
ALT: 16 U/L (ref 0–44)
AST: 19 U/L (ref 15–41)
Albumin: 4.7 g/dL (ref 3.5–5.0)
Alkaline Phosphatase: 65 U/L (ref 38–126)
Anion gap: 9 (ref 5–15)
BUN: 7 mg/dL (ref 6–20)
CO2: 27 mmol/L (ref 22–32)
Calcium: 9.7 mg/dL (ref 8.9–10.3)
Chloride: 108 mmol/L (ref 98–111)
Creatinine, Ser: 0.84 mg/dL (ref 0.44–1.00)
GFR calc Af Amer: >60 mL/min (ref >60)
GFR calc non Af Amer: >60 mL/min (ref >60)
Glucose, Bld: 132 mg/dL — ABNORMAL HIGH (ref 70–99)
Potassium: 4 mmol/L (ref 3.5–5.1)
Sodium: 144 mmol/L (ref 135–145)
Total Bilirubin: 0.4 mg/dL (ref 0.3–1.2)
Total Protein: 8.4 g/dL — ABNORMAL HIGH (ref 6.5–8.1)

## 2018-02-07 LAB — URINALYSIS, ROUTINE W REFLEX MICROSCOPIC
Bilirubin Urine: NEGATIVE
Glucose, UA: NEGATIVE mg/dL
Hgb urine dipstick: NEGATIVE
Ketones, ur: NEGATIVE mg/dL
Leukocytes, UA: NEGATIVE
Nitrite: NEGATIVE
Protein, ur: 100 mg/dL — AB
Specific Gravity, Urine: 1.027 (ref 1.005–1.030)
pH: 8 (ref 5.0–8.0)

## 2018-02-07 LAB — I-STAT BETA HCG BLOOD, ED (MC, WL, AP ONLY): I-stat hCG, quantitative: <5 m[IU]/mL (ref <5)

## 2018-02-07 LAB — LIPASE, BLOOD: Lipase: 20 U/L (ref 11–51)

## 2018-02-07 MED ORDER — HALOPERIDOL LACTATE 5 MG/ML IJ SOLN
4.0000 mg | Freq: Once | INTRAMUSCULAR | Status: AC
  Administered 2018-02-07: 4 mg via INTRAVENOUS
  Filled 2018-02-07: qty 1

## 2018-02-07 MED ORDER — PANTOPRAZOLE SODIUM 20 MG PO TBEC: 40.0000 mg | DELAYED_RELEASE_TABLET | Freq: Every day | ORAL | 0 refills | Status: DC

## 2018-02-07 MED ORDER — SODIUM CHLORIDE 0.9 % IV BOLUS
1000.0000 mL | Freq: Once | INTRAVENOUS | Status: AC
  Administered 2018-02-07: 1000 mL via INTRAVENOUS

## 2018-02-07 MED ORDER — ONDANSETRON 4 MG PO TBDP: 4.0000 mg | ORAL_TABLET | Freq: Three times a day (TID) | ORAL | 0 refills | Status: DC | PRN

## 2018-02-07 NOTE — ED Triage Notes (Signed)
Pt c/o upper abd pains with n/v since last night. Denies diarrhea.

## 2018-02-08 NOTE — ED Provider Notes (Signed)
[facility]-EMERGENCY DEPT Provider Note   CSN: [id number] Arrival date & time: 02/07/18  1210     History   Chief Complaint Chief Complaint  Patient presents with  . Abdominal Pain  . Emesis    HPI Bethany Gallagher is a 27 y.o. female.  HPI   27 year old female presents with concern for abdominal pain, nausea and vomiting.  Reports that she woke up at 3 AM with severe epigastric pain.  Describes it as sharp, constant.  Reports it is better in certain positions, and in the hot shower.  Significant nausea and vomiting, reporting vomiting-over 10 times but not counting. No diarrhea, last bowel movement was a few days ago.  Denies dysuria, fever, using ibuprofen, alcohol use the last few days and also denies regular alcohol use. No hx of abdominal surgeries. Did report an episode of emesis with dark color like coffee grounds.   Past Medical History:  Diagnosis Date  . Hx of chlamydia infection   . Hx of gonorrhea   . Pregnancy induced hypertension    2013   . Vaginal Pap smear, abnormal     Patient Active Problem List   Diagnosis Date Noted  . Supervision of normal pregnancy in third trimester 07/22/2017  . Mild tetrahydrocannabinol (THC) abuse 04/13/2016  . NSVD (normal spontaneous vaginal delivery) 11/23/2014    Past Surgical History:  Procedure Laterality Date  . NO PAST SURGERIES       OB History    Gravida  5   Para  5   Term  5   Preterm  0   AB  0   Living  5     SAB  0   TAB  0   Ectopic  0   Multiple  0   Live Births  5            Home Medications    Prior to Admission medications   Medication Sig Start Date End Date Taking? Authorizing Provider  clindamycin (CLINDAGEL) 1 % gel Apply topically 2 (two) times daily. Patient not taking: Reported on 02/07/2018 11/12/17   Aviva Signs, CNM  ondansetron (ZOFRAN ODT) 4 MG disintegrating tablet Take 1 tablet (4 mg total) by mouth every 8 (eight) hours as needed for  nausea or vomiting. 02/07/18   Alvira Monday, MD  pantoprazole (PROTONIX) 20 MG tablet Take 2 tablets (40 mg total) by mouth daily for 14 days. 02/07/18 02/21/18  Alvira Monday, MD  ranitidine (ZANTAC) 300 MG tablet Take 1 tablet (300 mg total) by mouth at bedtime. Patient not taking: Reported on 02/07/2018 11/24/17   Armando Reichert, CNM    Family History Family History  Problem Relation Age of Onset  . Cancer Maternal Grandfather   . Asthma Brother   . Alcohol abuse Neg Hx   . Diabetes Neg Hx   . Heart disease Neg Hx   . Hypertension Neg Hx   . Stroke Neg Hx     Social History Social History   Tobacco Use  . Smoking status: Former Smoker    Types: Cigarettes  . Smokeless tobacco: Former Neurosurgeon    Quit date: 11/05/2015  . Tobacco comment: with + preg test  Substance Use Topics  . Alcohol use: Not Currently  . Drug use: No    Comment: denies use of marajuana with current pregnancy     Allergies   Patient has no known allergies.   Review of Systems Review of Systems  Constitutional: Negative for fever.  HENT: Negative for sore throat.   Eyes: Negative for visual disturbance.  Respiratory: Negative for cough and shortness of breath.   Cardiovascular: Negative for chest pain.  Gastrointestinal: Positive for abdominal pain, nausea and vomiting. Negative for diarrhea.  Genitourinary: Negative for difficulty urinating.  Musculoskeletal: Negative for back pain and neck pain.  Skin: Negative for rash.  Neurological: Negative for syncope and headaches.     Physical Exam Updated Vital Signs BP 100/65 (BP Location: Left Arm)   Pulse 68   Temp 98.5 F (36.9 C) (Oral)   Resp 18   LMP 01/24/2018   SpO2 100%   Physical Exam  Constitutional: She is oriented to person, place, and time. She appears well-developed and well-nourished. No distress.  HENT:  Head: Normocephalic and atraumatic.  Eyes: Conjunctivae and EOM are normal.  Neck: Normal range of motion.    Cardiovascular: Normal rate, regular rhythm, normal heart sounds and intact distal pulses. Exam reveals no gallop and no friction rub.  No murmur heard. Pulmonary/Chest: Effort normal and breath sounds normal. No respiratory distress. She has no wheezes. She has no rales.  Abdominal: Soft. She exhibits no distension. There is tenderness in the epigastric area. There is no guarding and negative Murphy's sign.  Musculoskeletal: She exhibits no edema or tenderness.  Neurological: She is alert and oriented to person, place, and time.  Skin: Skin is warm and dry. No rash noted. She is not diaphoretic. No erythema.  Nursing note and vitals reviewed.    ED Treatments / Results  Labs (all labs ordered are listed, but only abnormal results are displayed) Labs Reviewed  COMPREHENSIVE METABOLIC PANEL - Abnormal; Notable for the following components:      Result Value   Glucose, Bld 132 (*)    Total Protein 8.4 (*)    All other components within normal limits  CBC - Abnormal; Notable for the following components:   Hemoglobin 10.6 (*)    HCT 34.5 (*)    All other components within normal limits  URINALYSIS, ROUTINE W REFLEX MICROSCOPIC - Abnormal; Notable for the following components:   APPearance CLOUDY (*)    Protein, ur 100 (*)    Bacteria, UA FEW (*)    All other components within normal limits  LIPASE, BLOOD  I-STAT BETA HCG BLOOD, ED ([facility], [facility], [facility] ONLY)    EKG None  Radiology No results found.  Procedures Procedures (including critical care time)  Medications Ordered in ED Medications  haloperidol lactate (HALDOL) injection 4 mg (4 mg Intravenous Given 02/07/18 1348)  sodium chloride 0.9 % bolus 1,000 mL (0 mLs Intravenous Stopped 02/07/18 1541)     Initial Impression / Assessment and Plan / ED Course  I have reviewed the triage vital signs and the nursing notes.  Pertinent labs & imaging results that were available during my care of the patient were reviewed by me and  considered in my medical decision making (see chart for details).     27 year old female with history above presents with concern for epigastric abdominal pain, nausea and vomiting.  Patient without distention, do not feel history and exam, cholecystitis.  No sign of pancreatitis or hepatitis on labs.  Urinalysis shows no evidence of infection.  Pregnancy test negative.  Reports improvement with hot showers, cannabinoid hyperemesis likely.  Also reports episode with frequent emesis.  Denies black or bloody stools, has normal hemoglobin, do not suspect clinically significant GI bleed at this time.  Suspect likely  gastritis.  Patient was given Haldol, IV fluids with improvement of symptoms, and was able to tolerate p.o. fluids.  Given prescription for Zofran and recommend follow-up with PCP.  Final Clinical Impressions(s) / ED Diagnoses   Final diagnoses:  Acute gastritis, presence of bleeding unspecified, unspecified gastritis type  Nausea and vomiting, intractability of vomiting not specified, unspecified vomiting type    ED Discharge Orders         Ordered    ondansetron (ZOFRAN ODT) 4 MG disintegrating tablet  Every 8 hours PRN     02/07/18 1529    pantoprazole (PROTONIX) 20 MG tablet  Daily,   Status:  Discontinued     02/07/18 1529    pantoprazole (PROTONIX) 20 MG tablet  Daily     02/07/18 1531           Alvira Monday, MD 02/08/18 [phone]

## 2018-02-11 ENCOUNTER — Emergency Department (HOSPITAL_COMMUNITY)
Admission: EM | Admit: 2018-02-11 | Discharge: 2018-02-11 | Disposition: A | Discharge status: Home / Self Care | Payer: Medicaid Other | Attending: Emergency Medicine | Admitting: Emergency Medicine

## 2018-02-11 ENCOUNTER — Encounter (HOSPITAL_COMMUNITY): Payer: Self-pay | Admitting: *Deleted

## 2018-02-11 DIAGNOSIS — Z87891 Personal history of nicotine dependence: Secondary | ICD-10-CM | POA: Insufficient documentation

## 2018-02-11 DIAGNOSIS — R1013 Epigastric pain: Secondary | ICD-10-CM | POA: Diagnosis present

## 2018-02-11 DIAGNOSIS — R112 Nausea with vomiting, unspecified: Secondary | ICD-10-CM

## 2018-02-11 LAB — LIPASE, BLOOD: Lipase: 28 U/L (ref 11–51)

## 2018-02-11 LAB — CBC WITH DIFFERENTIAL/PLATELET
Abs Immature Granulocytes: 0.01 10*3/uL (ref 0.00–0.07)
Basophils Absolute: 0 10*3/uL (ref 0.0–0.1)
Basophils Relative: 0 %
Eosinophils Absolute: 0.1 10*3/uL (ref 0.0–0.5)
Eosinophils Relative: 1 %
HCT: 35.3 % — ABNORMAL LOW (ref 36.0–46.0)
Hemoglobin: 10.7 g/dL — ABNORMAL LOW (ref 12.0–15.0)
Immature Granulocytes: 0 %
Lymphocytes Relative: 33 %
Lymphs Abs: 1.9 10*3/uL (ref 0.7–4.0)
MCH: 26.2 pg (ref 26.0–34.0)
MCHC: 30.3 g/dL (ref 30.0–36.0)
MCV: 86.3 fL (ref 80.0–100.0)
Monocytes Absolute: 0.4 10*3/uL (ref 0.1–1.0)
Monocytes Relative: 7 %
Neutro Abs: 3.3 10*3/uL (ref 1.7–7.7)
Neutrophils Relative %: 59 %
Platelets: 375 10*3/uL (ref 150–400)
RBC: 4.09 MIL/uL (ref 3.87–5.11)
RDW: 13.9 % (ref 11.5–15.5)
WBC: 5.6 10*3/uL (ref 4.0–10.5)
nRBC: 0 % (ref 0.0–0.2)

## 2018-02-11 LAB — COMPREHENSIVE METABOLIC PANEL
ALT: 12 U/L (ref 0–44)
AST: 16 U/L (ref 15–41)
Albumin: 4.2 g/dL (ref 3.5–5.0)
Alkaline Phosphatase: 65 U/L (ref 38–126)
Anion gap: 7 (ref 5–15)
BUN: 8 mg/dL (ref 6–20)
CO2: 25 mmol/L (ref 22–32)
Calcium: 9.3 mg/dL (ref 8.9–10.3)
Chloride: 106 mmol/L (ref 98–111)
Creatinine, Ser: 0.91 mg/dL (ref 0.44–1.00)
GFR calc Af Amer: >60 mL/min (ref >60)
GFR calc non Af Amer: >60 mL/min (ref >60)
Glucose, Bld: 99 mg/dL (ref 70–99)
Potassium: 3.7 mmol/L (ref 3.5–5.1)
Sodium: 138 mmol/L (ref 135–145)
Total Bilirubin: 0.3 mg/dL (ref 0.3–1.2)
Total Protein: 7.6 g/dL (ref 6.5–8.1)

## 2018-02-11 LAB — URINALYSIS, ROUTINE W REFLEX MICROSCOPIC
Bacteria, UA: NONE SEEN
Bilirubin Urine: NEGATIVE
Glucose, UA: NEGATIVE mg/dL
Hgb urine dipstick: NEGATIVE
Ketones, ur: NEGATIVE mg/dL
Leukocytes, UA: NEGATIVE
Nitrite: NEGATIVE
Protein, ur: NEGATIVE mg/dL
Specific Gravity, Urine: 1.021 (ref 1.005–1.030)
pH: 7 (ref 5.0–8.0)

## 2018-02-11 LAB — RAPID URINE DRUG SCREEN, HOSP PERFORMED
Amphetamines: NOT DETECTED
Barbiturates: NOT DETECTED
Benzodiazepines: NOT DETECTED
Cocaine: NOT DETECTED
Opiates: NOT DETECTED
Tetrahydrocannabinol: POSITIVE — AB

## 2018-02-11 LAB — I-STAT BETA HCG BLOOD, ED (MC, WL, AP ONLY): I-stat hCG, quantitative: <5 m[IU]/mL (ref <5)

## 2018-02-11 MED ORDER — SODIUM CHLORIDE 0.9 % IV BOLUS
500.0000 mL | Freq: Once | INTRAVENOUS | Status: AC
  Administered 2018-02-11: 500 mL via INTRAVENOUS

## 2018-02-11 MED ORDER — SUCRALFATE 1 G PO TABS
1.0000 g | ORAL_TABLET | Freq: Once | ORAL | Status: AC
  Administered 2018-02-11: 1 g via ORAL
  Filled 2018-02-11: qty 1

## 2018-02-11 MED ORDER — ONDANSETRON HCL 4 MG PO TABS: 4.0000 mg | ORAL_TABLET | Freq: Four times a day (QID) | ORAL | 0 refills | Status: DC

## 2018-02-11 MED ORDER — PROMETHAZINE HCL 25 MG/ML IJ SOLN
12.5000 mg | Freq: Once | INTRAMUSCULAR | Status: AC
  Administered 2018-02-11: 12.5 mg via INTRAVENOUS
  Filled 2018-02-11: qty 1

## 2018-02-11 MED ORDER — PANTOPRAZOLE SODIUM 40 MG IV SOLR
40.0000 mg | Freq: Once | INTRAVENOUS | Status: AC
  Administered 2018-02-11: 40 mg via INTRAVENOUS
  Filled 2018-02-11: qty 40

## 2018-02-11 MED ORDER — DICYCLOMINE HCL 10 MG PO CAPS
10.0000 mg | ORAL_CAPSULE | Freq: Once | ORAL | Status: AC
  Administered 2018-02-11: 10 mg via ORAL
  Filled 2018-02-11: qty 1

## 2018-02-11 NOTE — ED Provider Notes (Signed)
[facility] EMERGENCY DEPARTMENT Provider Note   CSN: [id number] Arrival date & time: 02/11/18  1046     History   Chief Complaint Chief Complaint  Patient presents with  . Abdominal Pain    HPI Bethany Gallagher is a 27 y.o. female.  HPI   Patient is a 27 year old female with a history of chlamydia, gonorrhea, who presents the emergency dept for evaluation of epigastric abd pain that has been intermittent for the last week. Last night pain seemed to worsen and become more severe.  Pain is constant.  It is associated with nausea and vomiting.  States that she has vomited less than 10 times today.  Denies hematemesis or coffee-ground emesis.  Denies diarrhea or constipation.  Denies urinary symptoms, fevers, or vaginal complaints.  Patient states she smokes marijuana on a daily basis.  Past Medical History:  Diagnosis Date  . Hx of chlamydia infection   . Hx of gonorrhea   . Pregnancy induced hypertension    2013   . Vaginal Pap smear, abnormal     Patient Active Problem List   Diagnosis Date Noted  . Supervision of normal pregnancy in third trimester 07/22/2017  . Mild tetrahydrocannabinol (THC) abuse 04/13/2016  . NSVD (normal spontaneous vaginal delivery) 11/23/2014    Past Surgical History:  Procedure Laterality Date  . NO PAST SURGERIES       OB History    Gravida  5   Para  5   Term  5   Preterm  0   AB  0   Living  5     SAB  0   TAB  0   Ectopic  0   Multiple  0   Live Births  5            Home Medications    Prior to Admission medications   Medication Sig Start Date End Date Taking? Authorizing Provider  clindamycin (CLINDAGEL) 1 % gel Apply topically 2 (two) times daily. Patient not taking: Reported on 02/07/2018 11/12/17   Aviva Signs, CNM  ondansetron (ZOFRAN ODT) 4 MG disintegrating tablet Take 1 tablet (4 mg total) by mouth every 8 (eight) hours as needed for nausea or vomiting. 02/07/18   Alvira Monday, MD  ondansetron (ZOFRAN) 4 MG tablet Take 1 tablet (4 mg total) by mouth every 6 (six) hours. 02/11/18   Tama Grosz S, PA-C  pantoprazole (PROTONIX) 20 MG tablet Take 2 tablets (40 mg total) by mouth daily for 14 days. 02/07/18 02/21/18  Alvira Monday, MD  ranitidine (ZANTAC) 300 MG tablet Take 1 tablet (300 mg total) by mouth at bedtime. Patient not taking: Reported on 02/07/2018 11/24/17   Armando Reichert, CNM    Family History Family History  Problem Relation Age of Onset  . Cancer Maternal Grandfather   . Asthma Brother   . Alcohol abuse Neg Hx   . Diabetes Neg Hx   . Heart disease Neg Hx   . Hypertension Neg Hx   . Stroke Neg Hx     Social History Social History   Tobacco Use  . Smoking status: Former Smoker    Types: Cigarettes  . Smokeless tobacco: Former Neurosurgeon    Quit date: 11/05/2015  . Tobacco comment: with + preg test  Substance Use Topics  . Alcohol use: Not Currently  . Drug use: No    Comment: denies use of marajuana with current pregnancy     Allergies  Patient has no known allergies.   Review of Systems Review of Systems  Constitutional: Negative for chills and fever.  HENT: Negative for congestion and rhinorrhea.   Eyes: Negative for visual disturbance.  Respiratory: Negative for cough and shortness of breath.   Cardiovascular: Negative for chest pain.  Gastrointestinal: Positive for abdominal pain, nausea and vomiting. Negative for blood in stool, constipation and diarrhea.       No hematemesis  Genitourinary: Negative for flank pain and hematuria.  Musculoskeletal: Negative for back pain.  Skin: Negative for color change.  Neurological: Negative for headaches.     Physical Exam Updated Vital Signs BP 112/65   Pulse 66   Temp 97.8 F (36.6 C) (Oral)   Resp 18   LMP 01/24/2018   SpO2 100%   Physical Exam  Constitutional: She appears well-developed and well-nourished. She appears distressed (crying).  HENT:  Head:  Normocephalic and atraumatic.  Eyes: Conjunctivae are normal.  Neck: Neck supple.  Cardiovascular: Normal rate, regular rhythm, normal heart sounds and intact distal pulses.  Pulmonary/Chest: Effort normal and breath sounds normal. No stridor. No respiratory distress. She has no wheezes.  Abdominal: Soft. There is tenderness (epigastric). There is no rigidity, no rebound and no guarding.  No CVA TTP.   Musculoskeletal: She exhibits no edema.  Neurological: She is alert.  Skin: Skin is warm and dry.  Psychiatric: She has a normal mood and affect.  Nursing note and vitals reviewed.  ED Treatments / Results  Labs (all labs ordered are listed, but only abnormal results are displayed) Labs Reviewed  CBC WITH DIFFERENTIAL/PLATELET - Abnormal; Notable for the following components:      Result Value   Hemoglobin 10.7 (*)    HCT 35.3 (*)    All other components within normal limits  URINALYSIS, ROUTINE W REFLEX MICROSCOPIC - Abnormal; Notable for the following components:   Color, Urine AMBER (*)    APPearance TURBID (*)    All other components within normal limits  RAPID URINE DRUG SCREEN, HOSP PERFORMED - Abnormal; Notable for the following components:   Tetrahydrocannabinol POSITIVE (*)    All other components within normal limits  COMPREHENSIVE METABOLIC PANEL  LIPASE, BLOOD  I-STAT BETA HCG BLOOD, ED ([facility], [facility], [facility] ONLY)    EKG None  Radiology No results found.  Procedures Procedures (including critical care time)  Medications Ordered in ED Medications  sodium chloride 0.9 % bolus 500 mL (0 mLs Intravenous Stopped 02/11/18 1154)  promethazine (PHENERGAN) injection 12.5 mg (12.5 mg Intravenous Given 02/11/18 1124)  dicyclomine (BENTYL) capsule 10 mg (10 mg Oral Given 02/11/18 1124)  pantoprazole (PROTONIX) injection 40 mg (40 mg Intravenous Given 02/11/18 1124)  sucralfate (CARAFATE) tablet 1 g (1 g Oral Given 02/11/18 1124)     Initial Impression / Assessment and Plan / ED  Course  I have reviewed the triage vital signs and the nursing notes.  Pertinent labs & imaging results that were available during my care of the patient were reviewed by me and considered in my medical decision making (see chart for details).      Final Clinical Impressions(s) / ED Diagnoses   Final diagnoses:  Epigastric pain  Non-intractable vomiting with nausea, unspecified vomiting type   Patient presented with epigastric abdominal pain, nausea and vomiting.  Has recently been seen for same with negative work-up.  Labs today are very reassuring.  There is no leukocytosis, her anemia is at baseline.  Her CMP is without electrolyte abnormality.  Normal kidney and liver function.  Lipase is negative.  UA is not suggestive of urinary tract infection.  Beta-hCG is negative.  UDS is positive for marijuana.  Question whether patient's symptoms are related to her marijuana use leading to nausea and vomiting.  Antiemetics, Bentyl, Protonix and Carafate given in the ED.  Also given IV fluids.  On reevaluation patient states she feels improved and no longer has any abdominal pain.  She was able to tolerate p.o. in the ED.  Repeat abdominal exam without tenderness.  Given her normal labs highly doubt cholecystitis or other acute intra-abdominal pathology at this time.  We will give her a referral to GI given her frequency of symptoms.  Have advised her to return to the ER for new or worsening symptoms.  She voices understanding the plan agrees to return to ED peer all questions answered.  ED Discharge Orders         Ordered    ondansetron (ZOFRAN) 4 MG tablet  Every 6 hours     02/11/18 1243           Karrie Meres, PA-C 02/11/18 1245    Maia Plan, MD 02/11/18 2033

## 2018-02-11 NOTE — ED Notes (Signed)
Pt tolerated water well

## 2018-02-11 NOTE — Discharge Instructions (Signed)
Please take medications as prescribed.  Please call the gastroenterology doctor to make an appointment for follow-up.  Please follow up with your primary doctor within the next 5-7 days.  If you do not have a primary care provider, information for a healthcare clinic has been provided for you to make arrangements for follow up care. Please return to the ER sooner if you have any new or worsening symptoms, or if you have any of the following symptoms:  Abdominal pain that does not go away.  You have a fever.  You keep throwing up (vomiting).  The pain is felt only in portions of the abdomen. Pain in the right side could possibly be appendicitis. In an adult, pain in the left lower portion of the abdomen could be colitis or diverticulitis.  You pass bloody or black tarry stools.  There is bright red blood in the stool.  The constipation stays for more than 4 days.  There is belly (abdominal) or rectal pain.  You do not seem to be getting better.  You have any questions or concerns.

## 2018-02-11 NOTE — ED Triage Notes (Signed)
Pt is here with abdominal pain which has been intermittent.  This episode began last pm and has been associated with nausea and vomiting,  No diarrhea, no fever.  Pt has been having gas pains with this and has been passing gas.  Pt had same symptoms on 11/4 and was evaluated this this at [facility], pt does not know what they dx her with.  LMP 2-2 1/2 weeks ago, LBM yesterday

## 2018-02-15 ENCOUNTER — Emergency Department (HOSPITAL_COMMUNITY): Payer: Medicaid Other

## 2018-02-15 ENCOUNTER — Observation Stay (HOSPITAL_BASED_OUTPATIENT_CLINIC_OR_DEPARTMENT_OTHER)
Admission: EM | Admit: 2018-02-15 | Discharge: 2018-02-17 | Disposition: A | Discharge status: Home / Self Care | Payer: Medicaid Other | Source: Home / Self Care | Attending: Emergency Medicine | Admitting: Emergency Medicine

## 2018-02-15 ENCOUNTER — Encounter (HOSPITAL_COMMUNITY): Payer: Self-pay

## 2018-02-15 ENCOUNTER — Other Ambulatory Visit: Payer: Self-pay

## 2018-02-15 DIAGNOSIS — F1210 Cannabis abuse, uncomplicated: Secondary | ICD-10-CM | POA: Insufficient documentation

## 2018-02-15 DIAGNOSIS — K8020 Calculus of gallbladder without cholecystitis without obstruction: Secondary | ICD-10-CM

## 2018-02-15 DIAGNOSIS — R945 Abnormal results of liver function studies: Secondary | ICD-10-CM

## 2018-02-15 DIAGNOSIS — R7989 Other specified abnormal findings of blood chemistry: Secondary | ICD-10-CM

## 2018-02-15 DIAGNOSIS — R740 Nonspecific elevation of levels of transaminase and lactic acid dehydrogenase [LDH]: Secondary | ICD-10-CM | POA: Diagnosis not present

## 2018-02-15 DIAGNOSIS — Z87891 Personal history of nicotine dependence: Secondary | ICD-10-CM | POA: Insufficient documentation

## 2018-02-15 DIAGNOSIS — Z79899 Other long term (current) drug therapy: Secondary | ICD-10-CM | POA: Insufficient documentation

## 2018-02-15 DIAGNOSIS — R1011 Right upper quadrant pain: Secondary | ICD-10-CM | POA: Diagnosis present

## 2018-02-15 DIAGNOSIS — R7401 Elevation of levels of liver transaminase levels: Secondary | ICD-10-CM | POA: Diagnosis present

## 2018-02-15 HISTORY — DX: Gastro-esophageal reflux disease without esophagitis: K21.9

## 2018-02-15 HISTORY — DX: Right upper quadrant pain: R10.11

## 2018-02-15 LAB — COMPREHENSIVE METABOLIC PANEL
ALT: 342 U/L — ABNORMAL HIGH (ref 0–44)
AST: 134 U/L — ABNORMAL HIGH (ref 15–41)
Albumin: 4.2 g/dL (ref 3.5–5.0)
Alkaline Phosphatase: 215 U/L — ABNORMAL HIGH (ref 38–126)
Anion gap: 9 (ref 5–15)
BUN: 6 mg/dL (ref 6–20)
CO2: 22 mmol/L (ref 22–32)
Calcium: 9.5 mg/dL (ref 8.9–10.3)
Chloride: 106 mmol/L (ref 98–111)
Creatinine, Ser: 0.93 mg/dL (ref 0.44–1.00)
GFR calc Af Amer: >60 mL/min (ref >60)
GFR calc non Af Amer: >60 mL/min (ref >60)
Glucose, Bld: 109 mg/dL — ABNORMAL HIGH (ref 70–99)
Potassium: 3.2 mmol/L — ABNORMAL LOW (ref 3.5–5.1)
Sodium: 137 mmol/L (ref 135–145)
Total Bilirubin: 0.6 mg/dL (ref 0.3–1.2)
Total Protein: 7.7 g/dL (ref 6.5–8.1)

## 2018-02-15 LAB — CBC WITH DIFFERENTIAL/PLATELET
Abs Immature Granulocytes: 0.01 10*3/uL (ref 0.00–0.07)
Basophils Absolute: 0 10*3/uL (ref 0.0–0.1)
Basophils Relative: 1 %
Eosinophils Absolute: 0.1 10*3/uL (ref 0.0–0.5)
Eosinophils Relative: 2 %
HCT: 35.2 % — ABNORMAL LOW (ref 36.0–46.0)
Hemoglobin: 11 g/dL — ABNORMAL LOW (ref 12.0–15.0)
Immature Granulocytes: 0 %
Lymphocytes Relative: 41 %
Lymphs Abs: 1.6 10*3/uL (ref 0.7–4.0)
MCH: 26.8 pg (ref 26.0–34.0)
MCHC: 31.3 g/dL (ref 30.0–36.0)
MCV: 85.6 fL (ref 80.0–100.0)
Monocytes Absolute: 0.3 10*3/uL (ref 0.1–1.0)
Monocytes Relative: 9 %
Neutro Abs: 1.9 10*3/uL (ref 1.7–7.7)
Neutrophils Relative %: 47 %
Platelets: 307 10*3/uL (ref 150–400)
RBC: 4.11 MIL/uL (ref 3.87–5.11)
RDW: 14.1 % (ref 11.5–15.5)
WBC: 3.8 10*3/uL — ABNORMAL LOW (ref 4.0–10.5)
nRBC: 0 % (ref 0.0–0.2)

## 2018-02-15 LAB — URINALYSIS, ROUTINE W REFLEX MICROSCOPIC
Bacteria, UA: NONE SEEN
Bilirubin Urine: NEGATIVE
Glucose, UA: NEGATIVE mg/dL
Hgb urine dipstick: NEGATIVE
Ketones, ur: 5 mg/dL — AB
Leukocytes, UA: NEGATIVE
Nitrite: NEGATIVE
Protein, ur: 30 mg/dL — AB
Specific Gravity, Urine: 1.029 (ref 1.005–1.030)
pH: 7 (ref 5.0–8.0)

## 2018-02-15 LAB — LIPASE, BLOOD: Lipase: 27 U/L (ref 11–51)

## 2018-02-15 LAB — I-STAT BETA HCG BLOOD, ED (MC, WL, AP ONLY): I-stat hCG, quantitative: <5 m[IU]/mL (ref <5)

## 2018-02-15 MED ORDER — ONDANSETRON HCL 4 MG PO TABS: 4.0000 mg | ORAL_TABLET | Freq: Four times a day (QID) | ORAL | Status: DC | PRN

## 2018-02-15 MED ORDER — ACETAMINOPHEN 325 MG PO TABS: 650.0000 mg | ORAL_TABLET | Freq: Four times a day (QID) | ORAL | Status: DC | PRN

## 2018-02-15 MED ORDER — ACETAMINOPHEN 650 MG RE SUPP: 650.0000 mg | Freq: Four times a day (QID) | RECTAL | Status: DC | PRN

## 2018-02-15 MED ORDER — HALOPERIDOL LACTATE 5 MG/ML IJ SOLN
2.0000 mg | Freq: Once | INTRAMUSCULAR | Status: AC
  Administered 2018-02-15: 2 mg via INTRAVENOUS
  Filled 2018-02-15: qty 1

## 2018-02-15 MED ORDER — SODIUM CHLORIDE 0.9 % IV BOLUS
1000.0000 mL | Freq: Once | INTRAVENOUS | Status: AC
  Administered 2018-02-15: 1000 mL via INTRAVENOUS

## 2018-02-15 MED ORDER — ONDANSETRON HCL 4 MG/2ML IJ SOLN: 4.0000 mg | Freq: Four times a day (QID) | INTRAMUSCULAR | Status: DC | PRN

## 2018-02-15 NOTE — ED Notes (Signed)
Pt given ice chips, tolerating well.

## 2018-02-15 NOTE — ED Provider Notes (Signed)
[facility] EMERGENCY DEPARTMENT Provider Note   CSN: [id number] Arrival date & time: 02/15/18  1843     History   Chief Complaint Chief Complaint  Patient presents with  . Abdominal Pain  . Nausea    HPI Bethany Gallagher is a 27 y.o. female presents for evaluation of ongoing and worsening nausea and vomiting.  She was seen for same symptoms on 02/11/2018 with temporary improvement but states that she has not been able to tolerate any p.o. food or fluids since then.  However, she does state that when she takes her nausea medicine this improves her symptoms and she is able to tolerate some food.  She notes a constant sharp pressure to the epigastric region which will radiate to the right upper quadrant of the abdomen and at times to the back, worsens with sitting upright, improves hunched forward in the fetal position. Denies hematemesis, coffee-ground emesis, melena, or hematochezia.  She denies urinary symptoms, fevers, chills, chest pain, shortness of breath, vaginal itching, bleeding, or discharge.  She states "what ever medicines they gave me through the IV really help, especially at [facility] ".  She states she has not smoked marijuana since the eighth when she was told that her marijuana consumption could be contributing to her symptoms.  She states that prior to this she would smoke marijuana daily or multiple times a day.  The history is provided by the patient.    Past Medical History:  Diagnosis Date  . Hx of chlamydia infection   . Hx of gonorrhea   . Pregnancy induced hypertension    2013   . Vaginal Pap smear, abnormal     Patient Active Problem List   Diagnosis Date Noted  . Supervision of normal pregnancy in third trimester 07/22/2017  . Mild tetrahydrocannabinol (THC) abuse 04/13/2016  . NSVD (normal spontaneous vaginal delivery) 11/23/2014    Past Surgical History:  Procedure Laterality Date  . NO PAST SURGERIES       OB History    Gravida  5   Para  5   Term  5   Preterm  0   AB  0   Living  5     SAB  0   TAB  0   Ectopic  0   Multiple  0   Live Births  5            Home Medications    Prior to Admission medications   Medication Sig Start Date End Date Taking? Authorizing Provider  clindamycin (CLINDAGEL) 1 % gel Apply topically 2 (two) times daily. Patient not taking: Reported on 02/07/2018 11/12/17   Aviva Signs, CNM  ondansetron (ZOFRAN ODT) 4 MG disintegrating tablet Take 1 tablet (4 mg total) by mouth every 8 (eight) hours as needed for nausea or vomiting. Patient not taking: Reported on 02/15/2018 02/07/18   Alvira Monday, MD  ondansetron (ZOFRAN) 4 MG tablet Take 1 tablet (4 mg total) by mouth every 6 (six) hours. Patient not taking: Reported on 02/15/2018 02/11/18   Couture, Cortni S, PA-C  pantoprazole (PROTONIX) 20 MG tablet Take 2 tablets (40 mg total) by mouth daily for 14 days. Patient not taking: Reported on 02/15/2018 02/07/18 02/21/18  Alvira Monday, MD  ranitidine (ZANTAC) 300 MG tablet Take 1 tablet (300 mg total) by mouth at bedtime. Patient not taking: Reported on 02/07/2018 11/24/17   Armando Reichert, CNM    Family History Family History  Problem Relation  Age of Onset  . Cancer Maternal Grandfather   . Asthma Brother   . Alcohol abuse Neg Hx   . Diabetes Neg Hx   . Heart disease Neg Hx   . Hypertension Neg Hx   . Stroke Neg Hx     Social History Social History   Tobacco Use  . Smoking status: Former Smoker    Types: Cigarettes  . Smokeless tobacco: Former Neurosurgeon    Quit date: 11/05/2015  . Tobacco comment: with + preg test  Substance Use Topics  . Alcohol use: Not Currently  . Drug use: No    Comment: denies use of marajuana with current pregnancy     Allergies   Patient has no known allergies.   Review of Systems Review of Systems  Constitutional: Negative for chills and fever.  Respiratory: Negative for shortness of breath.     Cardiovascular: Negative for chest pain.  Gastrointestinal: Positive for abdominal pain, nausea and vomiting. Negative for blood in stool.  Genitourinary: Negative for dysuria, frequency, hematuria, urgency, vaginal bleeding, vaginal discharge and vaginal pain.  All other systems reviewed and are negative.    Physical Exam Updated Vital Signs BP 113/68   Pulse 84   Temp 98.1 F (36.7 C) (Oral)   Resp 20   Ht 5\' 5"  (1.651 m)   Wt 55.3 kg   LMP 01/24/2018   SpO2 100%   BMI 20.30 kg/m   Physical Exam  Constitutional: She appears well-developed and well-nourished. No distress.  Appears comfortable in the fetal position  HENT:  Head: Normocephalic and atraumatic.  Eyes: Conjunctivae are normal. Right eye exhibits no discharge. Left eye exhibits no discharge.  Neck: No JVD present. No tracheal deviation present.  Cardiovascular: Normal rate, regular rhythm, normal heart sounds and intact distal pulses.  Pulmonary/Chest: Effort normal and breath sounds normal.  Abdominal: Soft. Bowel sounds are normal. She exhibits no distension. There is tenderness in the right upper quadrant, epigastric area and left upper quadrant. There is guarding and positive Murphy's sign. There is no rigidity, no rebound, no CVA tenderness and no tenderness at McBurney's point.  Musculoskeletal: She exhibits no edema.  No midline spine TTP, no paraspinal muscle tenderness, no deformity, crepitus, or step-off noted   Neurological: She is alert.  Skin: Skin is warm and dry. No erythema.  Psychiatric: She has a normal mood and affect. Her behavior is normal.  Nursing note and vitals reviewed.    ED Treatments / Results  Labs (all labs ordered are listed, but only abnormal results are displayed) Labs Reviewed  CBC WITH DIFFERENTIAL/PLATELET - Abnormal; Notable for the following components:      Result Value   WBC 3.8 (*)    Hemoglobin 11.0 (*)    HCT 35.2 (*)    All other components within normal  limits  COMPREHENSIVE METABOLIC PANEL - Abnormal; Notable for the following components:   Potassium 3.2 (*)    Glucose, Bld 109 (*)    AST 134 (*)    ALT 342 (*)    Alkaline Phosphatase 215 (*)    All other components within normal limits  URINALYSIS, ROUTINE W REFLEX MICROSCOPIC - Abnormal; Notable for the following components:   Color, Urine AMBER (*)    APPearance CLOUDY (*)    Ketones, ur 5 (*)    Protein, ur 30 (*)    All other components within normal limits  LIPASE, BLOOD  HEPATITIS PANEL, ACUTE  I-STAT BETA HCG BLOOD, ED ([facility], [facility],  [facility] ONLY)    EKG None  Radiology US Abdomen Limited Ruq  Result Date: 02/15/2018 CLINICAL DATA:  Right upper quadrant abdominal pain, elevated liver function test. EXAM: ULTRASOUND ABDOMEN LIMITED RIGHT UPPER QUADRANT COMPARISON:  None. FINDINGS: Gallbladder: Cholelithiasis is noted without significant gallbladder wall thickening. No pericholecystic fluid or sonographic Murphy's sign is noted. Common bile duct: Diameter: 5 mm which is within normal limits. Liver: No focal lesion identified. Within normal limits in parenchymal echogenicity. Portal vein is patent on color Doppler imaging with normal direction of blood flow towards the liver. IMPRESSION: Cholelithiasis without evidence of cholecystitis. No other significant abnormality seen in the right upper quadrant of the abdomen. Electronically Signed   By: Lupita Raider, M.D.   On: 02/15/2018 21:43    Procedures Procedures (including critical care time)  Medications Ordered in ED Medications  haloperidol lactate (HALDOL) injection 2 mg (2 mg Intravenous Given 02/15/18 1936)  sodium chloride 0.9 % bolus 1,000 mL (0 mLs Intravenous Stopped 02/15/18 2238)     Initial Impression / Assessment and Plan / ED Course  I have reviewed the triage vital signs and the nursing notes.  Pertinent labs & imaging results that were available during my care of the patient were reviewed by me and considered  in my medical decision making (see chart for details).     Patient presents for evaluation of ongoing and worsening upper abdominal pain with nausea and vomiting.  Worse in the right upper quadrant today.  This is her third visit in 8 days for similar symptoms.  She is afebrile, vital signs are stable.  She appears uncomfortable with maximal tenderness in the right upper quadrant.  Will obtain lab work and give Haldol and fluids for symptom management.  Lab work significant for acutely elevated LFTs as compared to 4 days ago.  No leukocytosis, stable anemia.  UA does not suggest UTI or nephrolithiasis but does suggest dehydration.  Will obtain right upper quad ultrasound for further evaluation and add hepatitis panel.  Ultrasound shows cholelithiasis without evidence of cholecystitis.  Common bile duct within normal limits. Patient improved with fluids and haldol.   10:48 PM Spoke with Dr. Corliss Skains with general surgery who does not feel strongly that the patient requires emergent cholecystectomy.  He states he is more concerned with possible acute hepatitis.  He recommends hospitalist admission, LFT trending, and GI consultation in the morning.  He states that they will see her in the morning in consultation while she is in the hospital as well. Spoke with Dr. Julian Reil with [facility] who agrees to assume care of patient and bring her into the hospital for further evaluation and management.  Final Clinical Impressions(s) / ED Diagnoses   Final diagnoses:  Elevated LFTs  Calculus of gallbladder without cholecystitis without obstruction    ED Discharge Orders    None       Bennye Alm 02/15/18 2304    Azalia Bilis, MD 02/15/18 2319

## 2018-02-15 NOTE — H&P (Signed)
History and Physical    OPEL LEJEUNE TFT:[id number] DOB: 1990/09/30 DOA: 02/15/2018  PCP: Shelly Bombard, MD  Patient coming from: Home  I have personally briefly reviewed patient's old medical records in [place]  Chief Complaint: N/V RUQ abd pain  HPI: Bethany Gallagher is a 27 y.o. female with medical history significant of PID in past, otherwise healthy.  Patient presents to the ED for evaluation of ongoing and worsening N/V.  Seen for same symptoms 11/8, temp improvement with treatment in ED, but symptoms reoccurred.  It was suggested to her at that time she might have cannabinoid hyperemesis syndrome.  She has stopped smoking cannabis but symptoms have persisted.  Symptoms worse with PO intake.  Pain located in RUQ.  Associated NB emesis.  No stigmata of GIB.   ED Course: LFTs now elevated in a cholestatic picture: ALK 215, AST 135, ALT 342   Review of Systems: As per HPI otherwise 10 point review of systems negative.   Past Medical History:  Diagnosis Date  . Hx of chlamydia infection   . Hx of gonorrhea   . Pregnancy induced hypertension    2013   . Vaginal Pap smear, abnormal     Past Surgical History:  Procedure Laterality Date  . NO PAST SURGERIES       reports that she has quit smoking. Her smoking use included cigarettes. She quit smokeless tobacco use about 2 years ago. She reports that she drank alcohol. She reports that she does not use drugs.  No Known Allergies  Family History  Problem Relation Age of Onset  . Cancer Maternal Grandfather   . Asthma Brother   . Alcohol abuse Neg Hx   . Diabetes Neg Hx   . Heart disease Neg Hx   . Hypertension Neg Hx   . Stroke Neg Hx      Prior to Admission medications   Medication Sig Start Date End Date Taking? Authorizing Provider  clindamycin (CLINDAGEL) 1 % gel Apply topically 2 (two) times daily. Patient not taking: Reported on 02/07/2018 11/12/17   Seabron Spates, CNM  ondansetron  (ZOFRAN ODT) 4 MG disintegrating tablet Take 1 tablet (4 mg total) by mouth every 8 (eight) hours as needed for nausea or vomiting. Patient not taking: Reported on 02/15/2018 02/07/18   Gareth Morgan, MD  ondansetron (ZOFRAN) 4 MG tablet Take 1 tablet (4 mg total) by mouth every 6 (six) hours. Patient not taking: Reported on 02/15/2018 02/11/18   Couture, Cortni S, PA-C  pantoprazole (PROTONIX) 20 MG tablet Take 2 tablets (40 mg total) by mouth daily for 14 days. Patient not taking: Reported on 02/15/2018 02/07/18 02/21/18  Gareth Morgan, MD  ranitidine (ZANTAC) 300 MG tablet Take 1 tablet (300 mg total) by mouth at bedtime. Patient not taking: Reported on 02/07/2018 11/24/17   Marcille Buffy D, CNM    Physical Exam: Vitals:   02/15/18 1849 02/15/18 1850 02/15/18 2233  BP: 128/79  113/68  Pulse: 79  84  Resp: 20  20  Temp: 98.1 F (36.7 C)    TempSrc: Oral    SpO2: 100%  100%  Weight:  55.3 kg   Height:  '5\' 5"'$  (1.651 m)     Constitutional: NAD, calm, comfortable Eyes: PERRL, lids and conjunctivae normal ENMT: Mucous membranes are moist. Posterior pharynx clear of any exudate or lesions.Normal dentition.  Neck: normal, supple, no masses, no thyromegaly Respiratory: clear to auscultation bilaterally, no wheezing, no crackles. Normal respiratory  effort. No accessory muscle use.  Cardiovascular: Regular rate and rhythm, no murmurs / rubs / gallops. No extremity edema. 2+ pedal pulses. No carotid bruits.  Abdomen: no tenderness, no masses palpated. No hepatosplenomegaly. Bowel sounds positive.  Musculoskeletal: no clubbing / cyanosis. No joint deformity upper and lower extremities. Good ROM, no contractures. Normal muscle tone.  Skin: no rashes, lesions, ulcers. No induration Neurologic: CN 2-12 grossly intact. Sensation intact, DTR normal. Strength 5/5 in all 4.  Psychiatric: Normal judgment and insight. Alert and oriented x 3. Normal mood.    Labs on Admission: I have personally  reviewed following labs and imaging studies  CBC: Recent Labs  Lab 02/11/18 1108 02/15/18 1934  WBC 5.6 3.8*  NEUTROABS 3.3 1.9  HGB 10.7* 11.0*  HCT 35.3* 35.2*  MCV 86.3 85.6  PLT 375 476   Basic Metabolic Panel: Recent Labs  Lab 02/11/18 1108 02/15/18 1934  NA 138 137  K 3.7 3.2*  CL 106 106  CO2 25 22  GLUCOSE 99 109*  BUN 8 6  CREATININE 0.91 0.93  CALCIUM 9.3 9.5   GFR: Estimated Creatinine Clearance: 80.7 mL/min (by C-G formula based on SCr of 0.93 mg/dL). Liver Function Tests: Recent Labs  Lab 02/11/18 1108 02/15/18 1934  AST 16 134*  ALT 12 342*  ALKPHOS 65 215*  BILITOT 0.3 0.6  PROT 7.6 7.7  ALBUMIN 4.2 4.2   Recent Labs  Lab 02/11/18 1108 02/15/18 1934  LIPASE 28 27   No results for input(s): AMMONIA in the last 168 hours. Coagulation Profile: No results for input(s): INR, PROTIME in the last 168 hours. Cardiac Enzymes: No results for input(s): CKTOTAL, CKMB, CKMBINDEX, TROPONINI in the last 168 hours. BNP (last 3 results) No results for input(s): PROBNP in the last 8760 hours. HbA1C: No results for input(s): HGBA1C in the last 72 hours. CBG: No results for input(s): GLUCAP in the last 168 hours. Lipid Profile: No results for input(s): CHOL, HDL, LDLCALC, TRIG, CHOLHDL, LDLDIRECT in the last 72 hours. Thyroid Function Tests: No results for input(s): TSH, T4TOTAL, FREET4, T3FREE, THYROIDAB in the last 72 hours. Anemia Panel: No results for input(s): VITAMINB12, FOLATE, FERRITIN, TIBC, IRON, RETICCTPCT in the last 72 hours. Urine analysis:    Component Value Date/Time   COLORURINE AMBER (A) 02/15/2018 1920   APPEARANCEUR CLOUDY (A) 02/15/2018 1920   LABSPEC 1.029 02/15/2018 1920   PHURINE 7.0 02/15/2018 1920   GLUCOSEU NEGATIVE 02/15/2018 1920   HGBUR NEGATIVE 02/15/2018 1920   BILIRUBINUR NEGATIVE 02/15/2018 1920   BILIRUBINUR neg 10/15/2014 1547   KETONESUR 5 (A) 02/15/2018 1920   PROTEINUR 30 (A) 02/15/2018 1920   UROBILINOGEN  0.2 10/25/2014 2110   NITRITE NEGATIVE 02/15/2018 1920   LEUKOCYTESUR NEGATIVE 02/15/2018 1920    Radiological Exams on Admission: US Abdomen Limited Ruq  Result Date: 02/15/2018 CLINICAL DATA:  Right upper quadrant abdominal pain, elevated liver function test. EXAM: ULTRASOUND ABDOMEN LIMITED RIGHT UPPER QUADRANT COMPARISON:  None. FINDINGS: Gallbladder: Cholelithiasis is noted without significant gallbladder wall thickening. No pericholecystic fluid or sonographic Murphy's sign is noted. Common bile duct: Diameter: 5 mm which is within normal limits. Liver: No focal lesion identified. Within normal limits in parenchymal echogenicity. Portal vein is patent on color Doppler imaging with normal direction of blood flow towards the liver. IMPRESSION: Cholelithiasis without evidence of cholecystitis. No other significant abnormality seen in the right upper quadrant of the abdomen. Electronically Signed   By: Marijo Conception, M.D.   On: 02/15/2018  21:43    EKG: Independently reviewed.  Assessment/Plan Principal Problem:   Colicky RUQ abdominal pain Active Problems:   Transaminitis    1. Colicky RUQ abd pain with transaminitis - cholestatic picture 1. HIDA scan, I am most suspicious of a gallbladder colic picture. 2. Repeat CMP in AM 3. Acute viral hepatitis pnl ordered, EBV also ordered, I would have expected less of a cholestatic picture with viral hepatitis though... 4. LFT elevation not typically seen in Fitz-Hugh-Curtis 1. No vaginal nor urinary complaints  DVT prophylaxis: SCDs Code Status: Full Family Communication: No family in room Disposition Plan: Home after admit Consults called: EDP spoke with gen surg Admission status: Place in [place], [place] [facility] Pager [phone] Only works nights!  If 7AM-7PM, please contact the primary day team physician taking care of patient  [email or web address] Password [facility]  02/15/2018, 11:28 PM

## 2018-02-15 NOTE — ED Notes (Signed)
Patient transported to US 

## 2018-02-15 NOTE — ED Triage Notes (Signed)
Pt reports chronic abd pain and n.v. Multiple episodes of vomiting since this morning. Pt was seen here last week and was told it was due to smoking marijuana and states she has not smoked since the last time she was seen here. Pt bent over in bed due to pain

## 2018-02-16 ENCOUNTER — Observation Stay (HOSPITAL_COMMUNITY): Payer: Medicaid Other

## 2018-02-16 ENCOUNTER — Encounter (HOSPITAL_COMMUNITY): Payer: Self-pay | Admitting: General Practice

## 2018-02-16 DIAGNOSIS — R740 Nonspecific elevation of levels of transaminase and lactic acid dehydrogenase [LDH]: Secondary | ICD-10-CM

## 2018-02-16 DIAGNOSIS — R1011 Right upper quadrant pain: Secondary | ICD-10-CM | POA: Diagnosis not present

## 2018-02-16 DIAGNOSIS — R945 Abnormal results of liver function studies: Secondary | ICD-10-CM

## 2018-02-16 LAB — COMPREHENSIVE METABOLIC PANEL
ALT: 422 U/L — ABNORMAL HIGH (ref 0–44)
AST: 350 U/L — ABNORMAL HIGH (ref 15–41)
Albumin: 3.7 g/dL (ref 3.5–5.0)
Alkaline Phosphatase: 214 U/L — ABNORMAL HIGH (ref 38–126)
Anion gap: 6 (ref 5–15)
BUN: 5 mg/dL — ABNORMAL LOW (ref 6–20)
CO2: 23 mmol/L (ref 22–32)
Calcium: 9.1 mg/dL (ref 8.9–10.3)
Chloride: 108 mmol/L (ref 98–111)
Creatinine, Ser: 0.77 mg/dL (ref 0.44–1.00)
GFR calc Af Amer: >60 mL/min (ref >60)
GFR calc non Af Amer: >60 mL/min (ref >60)
Glucose, Bld: 104 mg/dL — ABNORMAL HIGH (ref 70–99)
Potassium: 3.6 mmol/L (ref 3.5–5.1)
Sodium: 137 mmol/L (ref 135–145)
Total Bilirubin: 0.7 mg/dL (ref 0.3–1.2)
Total Protein: 6.9 g/dL (ref 6.5–8.1)

## 2018-02-16 MED ORDER — TECHNETIUM TC 99M MEBROFENIN IV KIT
5.0000 | PACK | Freq: Once | INTRAVENOUS | Status: AC | PRN
  Administered 2018-02-16: 5 via INTRAVENOUS

## 2018-02-16 NOTE — ED Notes (Signed)
Pt transported to procedure.

## 2018-02-16 NOTE — Progress Notes (Signed)
PROGRESS NOTE  Bethany Gallagher VWU:[id number] DOB: [birth year] DOA: 02/15/2018 PCP: Brock Bad, MD   LOS: 0 days   Brief Narrative / Interim history: 27 year old female with history of PID in the past but otherwise no active medical problems, admitted to the hospital with right upper quadrant abdominal pain as well as nausea vomiting.  Ultrasound showed cholelithiasis.  General surgery was consulted and we admitted patient  Subjective: -Feeling back to normal this morning, no longer has any pain, no nausea or vomiting.  Currently she is comfortable  Assessment & Plan: Principal Problem:   Colicky RUQ abdominal pain Active Problems:   Transaminitis  Colicky right upper quadrant abdominal pain with transaminitis, concern for cholecystitis  -Ultrasound with cholelithiasis, possible that she has passed a stone.  Bilirubin however is normal.  AST/ALT are increasing some today, acute hepatitis panel is pending, EBV pending as well -Appreciate surgery input, HIDA scan later today.  If positive will need cholecystectomy.  Scheduled Meds: Continuous Infusions: PRN Meds:.acetaminophen **OR** acetaminophen, ondansetron **OR** ondansetron (ZOFRAN) IV  DVT prophylaxis: SCDs Code Status: Full code Family Communication: no family at bedside Disposition Plan: home when ready   Consultants:   General surgery   Procedures:   None   Antimicrobials:  None    Objective: Vitals:   02/15/18 1849 02/15/18 1850 02/15/18 2233 02/16/18 0544  BP: 128/79  113/68 132/87  Pulse: 79  84 86  Resp: 20  20 20   Temp: 98.1 F (36.7 C)   97.6 F (36.4 C)  TempSrc: Oral   Oral  SpO2: 100%  100% 99%  Weight:  55.3 kg    Height:  5\' 5"  (1.651 m)      Intake/Output Summary (Last 24 hours) at 02/16/2018 1039 Last data filed at 02/15/2018 2238 Gross per 24 hour  Intake 1000 ml  Output -  Net 1000 ml   Filed Weights   02/15/18 1850  Weight: 55.3 kg     Examination:  Constitutional: NAD Eyes: lids and conjunctivae normal ENMT: Mucous membranes are moist. Respiratory: clear to auscultation bilaterally, no wheezing, no crackles.  Cardiovascular: Regular rate and rhythm, no murmurs / rubs / gallops. No LE edema Abdomen: no tenderness. Bowel sounds positive.  Skin: no rashes Neurologic: non focal   Data Reviewed: I have independently reviewed following labs and imaging studies   CBC: Recent Labs  Lab 02/11/18 1108 02/15/18 1934  WBC 5.6 3.8*  NEUTROABS 3.3 1.9  HGB 10.7* 11.0*  HCT 35.3* 35.2*  MCV 86.3 85.6  PLT 375 307   Basic Metabolic Panel: Recent Labs  Lab 02/11/18 1108 02/15/18 1934 02/16/18 0248  NA 138 137 137  K 3.7 3.2* 3.6  CL 106 106 108  CO2 25 22 23   GLUCOSE 99 109* 104*  BUN 8 6 5*  CREATININE 0.91 0.93 0.77  CALCIUM 9.3 9.5 9.1   GFR: Estimated Creatinine Clearance: 93.8 mL/min (by C-G formula based on SCr of 0.77 mg/dL). Liver Function Tests: Recent Labs  Lab 02/11/18 1108 02/15/18 1934 02/16/18 0248  AST 16 134* 350*  ALT 12 342* 422*  ALKPHOS 65 215* 214*  BILITOT 0.3 0.6 0.7  PROT 7.6 7.7 6.9  ALBUMIN 4.2 4.2 3.7   Recent Labs  Lab 02/11/18 1108 02/15/18 1934  LIPASE 28 27   No results for input(s): AMMONIA in the last 168 hours. Coagulation Profile: No results for input(s): INR, PROTIME in the last 168 hours. Cardiac Enzymes: No results for input(s): CKTOTAL, CKMB, CKMBINDEX,  TROPONINI in the last 168 hours. BNP (last 3 results) No results for input(s): PROBNP in the last 8760 hours. HbA1C: No results for input(s): HGBA1C in the last 72 hours. CBG: No results for input(s): GLUCAP in the last 168 hours. Lipid Profile: No results for input(s): CHOL, HDL, LDLCALC, TRIG, CHOLHDL, LDLDIRECT in the last 72 hours. Thyroid Function Tests: No results for input(s): TSH, T4TOTAL, FREET4, T3FREE, THYROIDAB in the last 72 hours. Anemia Panel: No results for input(s): VITAMINB12,  FOLATE, FERRITIN, TIBC, IRON, RETICCTPCT in the last 72 hours. Urine analysis:    Component Value Date/Time   COLORURINE AMBER (A) 02/15/2018 1920   APPEARANCEUR CLOUDY (A) 02/15/2018 1920   LABSPEC 1.029 02/15/2018 1920   PHURINE 7.0 02/15/2018 1920   GLUCOSEU NEGATIVE 02/15/2018 1920   HGBUR NEGATIVE 02/15/2018 1920   BILIRUBINUR NEGATIVE 02/15/2018 1920   BILIRUBINUR neg 10/15/2014 1547   KETONESUR 5 (A) 02/15/2018 1920   PROTEINUR 30 (A) 02/15/2018 1920   UROBILINOGEN 0.2 10/25/2014 2110   NITRITE NEGATIVE 02/15/2018 1920   LEUKOCYTESUR NEGATIVE 02/15/2018 1920   Sepsis Labs: Invalid input(s): PROCALCITONIN, LACTICIDVEN  No results found for this or any previous visit (from the past 240 hour(s)).    Radiology Studies: [place] Abdomen Limited Ruq  Result Date: 02/15/2018 CLINICAL DATA:  Right upper quadrant abdominal pain, elevated liver function test. EXAM: ULTRASOUND ABDOMEN LIMITED RIGHT UPPER QUADRANT COMPARISON:  None. FINDINGS: Gallbladder: Cholelithiasis is noted without significant gallbladder wall thickening. No pericholecystic fluid or sonographic Murphy's sign is noted. Common bile duct: Diameter: 5 mm which is within normal limits. Liver: No focal lesion identified. Within normal limits in parenchymal echogenicity. Portal vein is patent on color Doppler imaging with normal direction of blood flow towards the liver. IMPRESSION: Cholelithiasis without evidence of cholecystitis. No other significant abnormality seen in the right upper quadrant of the abdomen. Electronically Signed   By: Lupita RaiderJames  Green Jr, M.D.   On: 02/15/2018 21:43    Pamella Pertostin Gherghe, MD, PhD [facility] Pager [phone]  If 7PM-7AM, please contact night-coverage [email or web address] Password [email or web address] 02/16/2018, 10:39 AM

## 2018-02-16 NOTE — ED Notes (Signed)
Pt to get scan at noon, now NPO

## 2018-02-16 NOTE — Progress Notes (Signed)
[facility] Surgery Consult Note  Bethany Gallagher 1990/05/24  [id number].    Requesting MD: Marzetta Board Chief Complaint/Reason for Consult: RUQ pain  HPI:  Bethany Gallagher is a 27yo female who presented to the ED last night with persistent RUQ pain, nausea, and vomiting. States that these episodes started nearly two weeks ago. They are intermittent, but occur every day. Not associated with PO intake, rather they are random. She reports RUQ/epigastric pain sometimes radiating into her back associated with nausea and vomiting. Tylenol does not help.  Previously in the ED twice this month for these episodes and was thought to have cannabinoid hyperemesis syndrome. States that she has stopped smoking marijuana x1 week but her symptoms continue to recur. ED workup included u/s which shows gallstones, no evidence of cholecystitis. LFTs are elevated at AST 350, ALT 422, alk phos 214, and bilirubin 0.7 today. Hepatitis panel pending. General surgery asked to see.  No significant PMH Abdominal surgical history: none Anticoagulants: none Denies tobacco or alcohol use Smokes marijuana, denies any other drug use Employment: taking classes at [employer]  ROS: Review of Systems  Constitutional: Negative.   HENT: Negative.   Eyes: Negative.   Respiratory: Negative.   Cardiovascular: Negative.   Gastrointestinal: Positive for abdominal pain, nausea and vomiting. Negative for constipation and diarrhea.  Genitourinary: Negative.   Musculoskeletal: Negative.   Skin: Negative.   Neurological: Negative.    All systems reviewed and otherwise negative except for as above  Family History  Problem Relation Age of Onset  . Cancer Maternal Grandfather   . Asthma Brother   . Alcohol abuse Neg Hx   . Diabetes Neg Hx   . Heart disease Neg Hx   . Hypertension Neg Hx   . Stroke Neg Hx     Past Medical History:  Diagnosis Date  . Hx of chlamydia infection   . Hx of gonorrhea   . Pregnancy  induced hypertension    2013   . Vaginal Pap smear, abnormal     Past Surgical History:  Procedure Laterality Date  . NO PAST SURGERIES      Social History:  reports that she has quit smoking. Her smoking use included cigarettes. She quit smokeless tobacco use about 2 years ago. She reports that she drank alcohol. She reports that she does not use drugs.  Allergies: No Known Allergies   (Not in a hospital admission)  Prior to Admission medications   Medication Sig Start Date End Date Taking? Authorizing Provider  clindamycin (CLINDAGEL) 1 % gel Apply topically 2 (two) times daily. Patient not taking: Reported on 02/07/2018 11/12/17   Seabron Spates, CNM  ondansetron (ZOFRAN ODT) 4 MG disintegrating tablet Take 1 tablet (4 mg total) by mouth every 8 (eight) hours as needed for nausea or vomiting. Patient not taking: Reported on 02/15/2018 02/07/18   Gareth Morgan, MD  ondansetron (ZOFRAN) 4 MG tablet Take 1 tablet (4 mg total) by mouth every 6 (six) hours. Patient not taking: Reported on 02/15/2018 02/11/18   Couture, Cortni S, PA-C  pantoprazole (PROTONIX) 20 MG tablet Take 2 tablets (40 mg total) by mouth daily for 14 days. Patient not taking: Reported on 02/15/2018 02/07/18 02/21/18  Gareth Morgan, MD  ranitidine (ZANTAC) 300 MG tablet Take 1 tablet (300 mg total) by mouth at bedtime. Patient not taking: Reported on 02/07/2018 11/24/17   Marcille Buffy D, CNM    Blood pressure 132/87, pulse 86, temperature 97.6 F (36.4 C), temperature source Oral,  resp. rate 20, height 5' 5"  (1.651 m), weight 55.3 kg, last menstrual period 01/24/2018, SpO2 99 %, not currently breastfeeding. Physical Exam: General: pleasant, WD/WN AA female who is laying in bed in NAD HEENT: head is normocephalic, atraumatic.  Sclera are noninjected.  Pupils equal and round.  Ears and nose without any masses or lesions.  Mouth is pink and moist. Dentition fair Heart: regular, rate, and rhythm.  No obvious  murmurs, gallops, or rubs noted.  Palpable pedal pulses bilaterally Lungs: CTAB, no wheezes, rhonchi, or rales noted.  Respiratory effort nonlabored Abd: soft, NT/ND, +BS, no masses, hernias, or organomegaly MS: all 4 extremities are symmetrical with no cyanosis, clubbing, or edema. Skin: warm and dry with no masses, lesions, or rashes Psych: A&Ox3 with an appropriate affect. Neuro: cranial nerves grossly intact, extremity CSM intact bilaterally, normal speech  Results for orders placed or performed during the hospital encounter of 02/15/18 (from the past 48 hour(s))  Urinalysis, Routine w reflex microscopic     Status: Abnormal   Collection Time: 02/15/18  7:20 PM  Result Value Ref Range   Color, Urine AMBER (A) YELLOW    Comment: BIOCHEMICALS MAY BE AFFECTED BY COLOR   APPearance CLOUDY (A) CLEAR   Specific Gravity, Urine 1.029 1.005 - 1.030   pH 7.0 5.0 - 8.0   Glucose, UA NEGATIVE NEGATIVE mg/dL   Hgb urine dipstick NEGATIVE NEGATIVE   Bilirubin Urine NEGATIVE NEGATIVE   Ketones, ur 5 (A) NEGATIVE mg/dL   Protein, ur 30 (A) NEGATIVE mg/dL   Nitrite NEGATIVE NEGATIVE   Leukocytes, UA NEGATIVE NEGATIVE   RBC / HPF 6-10 0 - 5 RBC/hpf   Bacteria, UA NONE SEEN NONE SEEN   Squamous Epithelial / LPF 0-5 0 - 5   Mucus PRESENT    Amorphous Crystal PRESENT     Comment: Performed at [facility], [place] [address] [place], [place] [address]  CBC with Differential     Status: Abnormal   Collection Time: 02/15/18  7:34 PM  Result Value Ref Range   WBC 3.8 (L) 4.0 - 10.5 K/uL   RBC 4.11 3.87 - 5.11 MIL/uL   Hemoglobin 11.0 (L) 12.0 - 15.0 g/dL   HCT 35.2 (L) 36.0 - 46.0 %   MCV 85.6 80.0 - 100.0 fL   MCH 26.8 26.0 - 34.0 pg   MCHC 31.3 30.0 - 36.0 g/dL   RDW 14.1 11.5 - 15.5 %   Platelets 307 150 - 400 K/uL   nRBC 0.0 0.0 - 0.2 %   Neutrophils Relative % 47 %   Neutro Abs 1.9 1.7 - 7.7 K/uL   Lymphocytes Relative 41 %   Lymphs Abs 1.6 0.7 - 4.0 K/uL   Monocytes Relative 9 %    Monocytes Absolute 0.3 0.1 - 1.0 K/uL   Eosinophils Relative 2 %   Eosinophils Absolute 0.1 0.0 - 0.5 K/uL   Basophils Relative 1 %   Basophils Absolute 0.0 0.0 - 0.1 K/uL   Immature Granulocytes 0 %   Abs Immature Granulocytes 0.01 0.00 - 0.07 K/uL    Comment: Performed at [facility] [address], [place], [place] [address]  Comprehensive metabolic panel     Status: Abnormal   Collection Time: 02/15/18  7:34 PM  Result Value Ref Range   Sodium 137 135 - 145 mmol/L   Potassium 3.2 (L) 3.5 - 5.1 mmol/L   Chloride 106 98 - 111 mmol/L   CO2 22 22 -  32 mmol/L   Glucose, Bld 109 (H) 70 - 99 mg/dL   BUN 6 6 - 20 mg/dL   Creatinine, Ser 0.93 0.44 - 1.00 mg/dL   Calcium 9.5 8.9 - 10.3 mg/dL   Total Protein 7.7 6.5 - 8.1 g/dL   Albumin 4.2 3.5 - 5.0 g/dL   AST 134 (H) 15 - 41 U/L   ALT 342 (H) 0 - 44 U/L   Alkaline Phosphatase 215 (H) 38 - 126 U/L   Total Bilirubin 0.6 0.3 - 1.2 mg/dL   GFR calc non Af Amer >60 >60 mL/min   GFR calc Af Amer >60 >60 mL/min    Comment: (NOTE) The eGFR has been calculated using the CKD EPI equation. This calculation has not been validated in all clinical situations. eGFR's persistently <60 mL/min signify possible Chronic Kidney Disease.    Anion gap 9 5 - 15    Comment: Performed at [facility] [address], [place], [place] [address]  Lipase, blood     Status: None   Collection Time: 02/15/18  7:34 PM  Result Value Ref Range   Lipase 27 11 - 51 U/L    Comment: Performed at [facility] [address], [place], [place] [address]  I-Stat beta hCG blood, ED     Status: None   Collection Time: 02/15/18  7:47 PM  Result Value Ref Range   I-stat hCG, quantitative <5.0 <5 mIU/mL   Comment 3            Comment:   GEST. AGE      CONC.  (mIU/mL)   <=1 WEEK        5 - 50     2 WEEKS       50 - 500     3 WEEKS       100 - 10,000     4 WEEKS     1,000 - 30,000        FEMALE AND NON-PREGNANT FEMALE:     LESS THAN 5 mIU/mL    Comprehensive metabolic panel     Status: Abnormal   Collection Time: 02/16/18  2:48 AM  Result Value Ref Range   Sodium 137 135 - 145 mmol/L   Potassium 3.6 3.5 - 5.1 mmol/L   Chloride 108 98 - 111 mmol/L   CO2 23 22 - 32 mmol/L   Glucose, Bld 104 (H) 70 - 99 mg/dL   BUN 5 (L) 6 - 20 mg/dL   Creatinine, Ser 0.77 0.44 - 1.00 mg/dL   Calcium 9.1 8.9 - 10.3 mg/dL   Total Protein 6.9 6.5 - 8.1 g/dL   Albumin 3.7 3.5 - 5.0 g/dL   AST 350 (H) 15 - 41 U/L   ALT 422 (H) 0 - 44 U/L   Alkaline Phosphatase 214 (H) 38 - 126 U/L   Total Bilirubin 0.7 0.3 - 1.2 mg/dL   GFR calc non Af Amer >60 >60 mL/min   GFR calc Af Amer >60 >60 mL/min    Comment: (NOTE) The eGFR has been calculated using the CKD EPI equation. This calculation has not been validated in all clinical situations. eGFR's persistently <60 mL/min signify possible Chronic Kidney Disease.    Anion gap 6 5 - 15    Comment: Performed at [facility] [address], [place], [place] [address]   US Abdomen Limited Ruq  Result Date: 02/15/2018 CLINICAL DATA:  Right upper quadrant abdominal pain, elevated liver  function test. EXAM: ULTRASOUND ABDOMEN LIMITED RIGHT UPPER QUADRANT COMPARISON:  None. FINDINGS: Gallbladder: Cholelithiasis is noted without significant gallbladder wall thickening. No pericholecystic fluid or sonographic Murphy's sign is noted. Common bile duct: Diameter: 5 mm which is within normal limits. Liver: No focal lesion identified. Within normal limits in parenchymal echogenicity. Portal vein is patent on color Doppler imaging with normal direction of blood flow towards the liver. IMPRESSION: Cholelithiasis without evidence of cholecystitis. No other significant abnormality seen in the right upper quadrant of the abdomen. Electronically Signed   By: Marijo Conception, M.D.   On: 02/15/2018 21:43      Assessment/Plan RUQ pain, nausea, vomiting Cholelithiasis Transaminitis - Patient with intermittent abdominal  pain, n/v for nearly 2 weeks. She does have gallstones and elevated LFTs, HIDA ordered to evaluate gallbladder. NPO for HIDA, then may resume fulls. Hepatitis panel also pending. Will continue to follow.  ID - none VTE - SCDs, per primary FEN - IVF, NPO Foley - none Follow up - TBD   Wellington [place], [facility] Surgery 02/16/2018, 7:39 AM Pager: [phone] Mon 7:00 am -11:30 AM Tues-Fri 7:00 am-4:30 pm Sat-Sun 7:00 am-11:30 am

## 2018-02-16 NOTE — ED Notes (Signed)
Attempted to give report 5C, states they are waiting for the 3rd nurse to arrive. Unable to receive Pt at this time

## 2018-02-17 ENCOUNTER — Encounter (HOSPITAL_COMMUNITY): Payer: Self-pay | Admitting: Emergency Medicine

## 2018-02-17 ENCOUNTER — Inpatient Hospital Stay (HOSPITAL_COMMUNITY)
Admission: EM | Admit: 2018-02-17 | Discharge: 2018-02-21 | DRG: 419 | Disposition: A | Discharge status: Home / Self Care | Payer: Medicaid Other | Attending: Surgery | Admitting: Surgery

## 2018-02-17 DIAGNOSIS — K8020 Calculus of gallbladder without cholecystitis without obstruction: Secondary | ICD-10-CM

## 2018-02-17 DIAGNOSIS — R740 Nonspecific elevation of levels of transaminase and lactic acid dehydrogenase [LDH]: Secondary | ICD-10-CM

## 2018-02-17 DIAGNOSIS — Z8619 Personal history of other infectious and parasitic diseases: Secondary | ICD-10-CM | POA: Diagnosis present

## 2018-02-17 DIAGNOSIS — K8012 Calculus of gallbladder with acute and chronic cholecystitis without obstruction: Principal | ICD-10-CM | POA: Diagnosis present

## 2018-02-17 DIAGNOSIS — R627 Adult failure to thrive: Secondary | ICD-10-CM | POA: Diagnosis present

## 2018-02-17 DIAGNOSIS — I10 Essential (primary) hypertension: Secondary | ICD-10-CM | POA: Diagnosis present

## 2018-02-17 DIAGNOSIS — K812 Acute cholecystitis with chronic cholecystitis: Secondary | ICD-10-CM | POA: Diagnosis present

## 2018-02-17 DIAGNOSIS — K219 Gastro-esophageal reflux disease without esophagitis: Secondary | ICD-10-CM | POA: Diagnosis present

## 2018-02-17 DIAGNOSIS — Z6820 Body mass index (BMI) 20.0-20.9, adult: Secondary | ICD-10-CM

## 2018-02-17 DIAGNOSIS — Z419 Encounter for procedure for purposes other than remedying health state, unspecified: Secondary | ICD-10-CM

## 2018-02-17 DIAGNOSIS — R110 Nausea: Secondary | ICD-10-CM

## 2018-02-17 DIAGNOSIS — Z87891 Personal history of nicotine dependence: Secondary | ICD-10-CM

## 2018-02-17 DIAGNOSIS — R1011 Right upper quadrant pain: Secondary | ICD-10-CM | POA: Diagnosis not present

## 2018-02-17 DIAGNOSIS — R7401 Elevation of levels of liver transaminase levels: Secondary | ICD-10-CM | POA: Diagnosis present

## 2018-02-17 DIAGNOSIS — R945 Abnormal results of liver function studies: Secondary | ICD-10-CM | POA: Diagnosis not present

## 2018-02-17 LAB — COMPREHENSIVE METABOLIC PANEL
ALT: 399 U/L — ABNORMAL HIGH (ref 0–44)
AST: 338 U/L — ABNORMAL HIGH (ref 15–41)
Albumin: 3.8 g/dL (ref 3.5–5.0)
Alkaline Phosphatase: 201 U/L — ABNORMAL HIGH (ref 38–126)
Anion gap: 9 (ref 5–15)
BUN: 5 mg/dL — ABNORMAL LOW (ref 6–20)
CO2: 25 mmol/L (ref 22–32)
Calcium: 9.3 mg/dL (ref 8.9–10.3)
Chloride: 102 mmol/L (ref 98–111)
Creatinine, Ser: 0.9 mg/dL (ref 0.44–1.00)
GFR calc Af Amer: >60 mL/min (ref >60)
GFR calc non Af Amer: >60 mL/min (ref >60)
Glucose, Bld: 95 mg/dL (ref 70–99)
Potassium: 3.3 mmol/L — ABNORMAL LOW (ref 3.5–5.1)
Sodium: 136 mmol/L (ref 135–145)
Total Bilirubin: 0.8 mg/dL (ref 0.3–1.2)
Total Protein: 7.2 g/dL (ref 6.5–8.1)

## 2018-02-17 LAB — EPSTEIN-BARR VIRUS VCA ANTIBODY PANEL
EBV Early Antigen Ab, IgG: <9 U/mL (ref 0.0–8.9)
EBV NA IgG: >600 U/mL — ABNORMAL HIGH (ref 0.0–17.9)
EBV VCA IgG: 450 U/mL — ABNORMAL HIGH (ref 0.0–17.9)
EBV VCA IgM: <36 U/mL (ref 0.0–35.9)

## 2018-02-17 LAB — CBC
HCT: 33.4 % — ABNORMAL LOW (ref 36.0–46.0)
Hemoglobin: 10.4 g/dL — ABNORMAL LOW (ref 12.0–15.0)
MCH: 26.5 pg (ref 26.0–34.0)
MCHC: 31.1 g/dL (ref 30.0–36.0)
MCV: 85 fL (ref 80.0–100.0)
Platelets: 287 10*3/uL (ref 150–400)
RBC: 3.93 MIL/uL (ref 3.87–5.11)
RDW: 13.9 % (ref 11.5–15.5)
WBC: 3.5 10*3/uL — ABNORMAL LOW (ref 4.0–10.5)
nRBC: 0 % (ref 0.0–0.2)

## 2018-02-17 LAB — HEPATITIS PANEL, ACUTE
HCV Ab: <0.1 s/co ratio (ref 0.0–0.9)
HCV Ab: <0.1 s/co ratio (ref 0.0–0.9)
Hep A IgM: NEGATIVE
Hep A IgM: NEGATIVE
Hep B C IgM: NEGATIVE
Hep B C IgM: NEGATIVE
Hepatitis B Surface Ag: NEGATIVE
Hepatitis B Surface Ag: NEGATIVE

## 2018-02-17 MED ORDER — ONDANSETRON HCL 4 MG PO TABS: 4.0000 mg | ORAL_TABLET | Freq: Four times a day (QID) | ORAL | 0 refills | Status: DC | PRN

## 2018-02-17 NOTE — Discharge Summary (Signed)
Physician Discharge Summary  Bethany Gallagher WJX:[id number] DOB: 03/16/1991 DOA: 02/15/2018  PCP: Brock Bad, MD  Admit date: 02/15/2018 Discharge date: 02/17/2018  Admitted From: Home Disposition: Home  Recommendations for Outpatient Follow-up:  1. Follow up with general surgery as an outpatient in 1 to 2 weeks  Home Health: None Equipment/Devices: None  Discharge Condition: Stable CODE STATUS: Full code Diet recommendation: Regular  HPI: Per Dr. Beverely Low is a 27 y.o. female with medical history significant of PID in past, otherwise healthy.  Patient presents to the ED for evaluation of ongoing and worsening N/V.  Seen for same symptoms 11/8, temp improvement with treatment in ED, but symptoms reoccurred.  It was suggested to her at that time she might have cannabinoid hyperemesis syndrome.  She has stopped smoking cannabis but symptoms have persisted. Symptoms worse with PO intake.  Pain located in RUQ.  Associated NB emesis.  No stigmata of GIB.  Hospital Course: Colicky right upper quadrant abdominal pain with transaminitis, concern for cholecystitis -patient was admitted to the hospital with suspicion for cholecystitis.  General surgery was consulted and followed patient while hospitalized.  Her symptoms resolved completely shortly after admission she was able to tolerate a regular diet.  She did have elevation of her AST and ALT however her bilirubin has remained normal.  Her LFTs are starting to improve.  She underwent a HIDA scan which was negative for acute cholecystitis, gallbladder ejection fraction may be below normal although a calculated number could not be performed.  There was a suspicion that she may have had passed a stone.  After discussion between patient and general surgery, she will be followed up as an outpatient without any further procedures in the hospital.  She will be discharged home in stable condition with her symptoms completely  resolved.  Discharge Diagnoses:  Principal Problem:   Colicky RUQ abdominal pain Active Problems:   Transaminitis   Discharge Instructions   Allergies as of 02/17/2018   No Known Allergies     Medication List    STOP taking these medications   clindamycin 1 % gel Commonly known as:  CLINDAGEL   ondansetron 4 MG disintegrating tablet Commonly known as:  ZOFRAN-ODT   pantoprazole 20 MG tablet Commonly known as:  PROTONIX   ranitidine 300 MG tablet Commonly known as:  ZANTAC     TAKE these medications   ondansetron 4 MG tablet Commonly known as:  ZOFRAN Take 1 tablet (4 mg total) by mouth every 6 (six) hours as needed for nausea. What changed:    when to take this  reasons to take this      Follow-up Information    Berna Bue, MD. Call.   Specialty:  General Surgery Why:  We are working on your appointment, please call to confirm. Please arrive 30 minutes prior to your appointment to check in and fill out paperwork. Bring photo ID and insurance information. Contact information: [address] [place] [address] [phone]           Consultations:  General surgery   Procedures/Studies:  Nm Hepato W/eject Fract  Result Date: 02/16/2018 CLINICAL DATA:  Right upper quadrant pain EXAM: NUCLEAR MEDICINE HEPATOBILIARY IMAGING WITH GALLBLADDER EF TECHNIQUE: Sequential images of the abdomen were obtained out to 60 minutes following intravenous administration of radiopharmaceutical. After oral ingestion of Ensure, gallbladder ejection fraction was determined. At 60 min, normal ejection fraction is greater than 33%. RADIOPHARMACEUTICALS:  Five  mCi Tc-5059m  Choletec IV COMPARISON:  Ultrasound from the previous day FINDINGS: Prompt uptake and biliary excretion of activity by the liver is seen. Gallbladder activity is visualized, consistent with patency of cystic duct. Biliary activity passes into small bowel, consistent with patent  common bile duct. Calculated gallbladder ejection fraction could not be adequately performed due to the significant patient motion. Evaluation of the send a images suggests some gallbladder emptying although the gallbladder ejection fraction is likely below normal. IMPRESSION: Normal uptake and excretion of biliary tracer. Gallbladder ejection fraction is felt to be below normal although a calculated number could not be performed due to significant patient motion artifact. Electronically Signed   By: Alcide CleverMark  Lukens M.D.   On: 02/16/2018 15:36   [place] Abdomen Limited Ruq  Result Date: 02/15/2018 CLINICAL DATA:  Right upper quadrant abdominal pain, elevated liver function test. EXAM: ULTRASOUND ABDOMEN LIMITED RIGHT UPPER QUADRANT COMPARISON:  None. FINDINGS: Gallbladder: Cholelithiasis is noted without significant gallbladder wall thickening. No pericholecystic fluid or sonographic Murphy's sign is noted. Common bile duct: Diameter: 5 mm which is within normal limits. Liver: No focal lesion identified. Within normal limits in parenchymal echogenicity. Portal vein is patent on color Doppler imaging with normal direction of blood flow towards the liver. IMPRESSION: Cholelithiasis without evidence of cholecystitis. No other significant abnormality seen in the right upper quadrant of the abdomen. Electronically Signed   By: Lupita RaiderJames  Green Jr, M.D.   On: 02/15/2018 21:43      Subjective: - no chest pain, shortness of breath, no abdominal pain, nausea or vomiting.   Discharge Exam: Vitals:   02/17/18 0551 02/17/18 1100  BP: 109/69 112/85  Pulse: 65 64  Resp: 16 18  Temp: 98.7 F (37.1 C)   SpO2: 100% 100%    General: Pt is alert, awake, not in acute distress    The results of significant diagnostics from this hospitalization (including imaging, microbiology, ancillary and laboratory) are listed below for reference.     Microbiology: No results found for this or any previous visit (from the past 240  hour(s)).   Labs: BNP (last 3 results) No results for input(s): BNP in the last 8760 hours. Basic Metabolic Panel: Recent Labs  Lab 02/11/18 1108 02/15/18 1934 02/16/18 0248 02/17/18 0330  NA 138 137 137 136  K 3.7 3.2* 3.6 3.3*  CL 106 106 108 102  CO2 25 22 23 25   GLUCOSE 99 109* 104* 95  BUN 8 6 5* 5*  CREATININE 0.91 0.93 0.77 0.90  CALCIUM 9.3 9.5 9.1 9.3   Liver Function Tests: Recent Labs  Lab 02/11/18 1108 02/15/18 1934 02/16/18 0248 02/17/18 0330  AST 16 134* 350* 338*  ALT 12 342* 422* 399*  ALKPHOS 65 215* 214* 201*  BILITOT 0.3 0.6 0.7 0.8  PROT 7.6 7.7 6.9 7.2  ALBUMIN 4.2 4.2 3.7 3.8   Recent Labs  Lab 02/11/18 1108 02/15/18 1934  LIPASE 28 27   No results for input(s): AMMONIA in the last 168 hours. CBC: Recent Labs  Lab 02/11/18 1108 02/15/18 1934 02/17/18 0330  WBC 5.6 3.8* 3.5*  NEUTROABS 3.3 1.9  --   HGB 10.7* 11.0* 10.4*  HCT 35.3* 35.2* 33.4*  MCV 86.3 85.6 85.0  PLT 375 307 287   Cardiac Enzymes: No results for input(s): CKTOTAL, CKMB, CKMBINDEX, TROPONINI in the last 168 hours. BNP: Invalid input(s): POCBNP CBG: No results for input(s): GLUCAP in the last 168 hours. D-Dimer No results for input(s): DDIMER in the  last 72 hours. Hgb A1c No results for input(s): HGBA1C in the last 72 hours. Lipid Profile No results for input(s): CHOL, HDL, LDLCALC, TRIG, CHOLHDL, LDLDIRECT in the last 72 hours. Thyroid function studies No results for input(s): TSH, T4TOTAL, T3FREE, THYROIDAB in the last 72 hours.  Invalid input(s): FREET3 Anemia work up No results for input(s): VITAMINB12, FOLATE, FERRITIN, TIBC, IRON, RETICCTPCT in the last 72 hours. Urinalysis    Component Value Date/Time   COLORURINE AMBER (A) 02/15/2018 1920   APPEARANCEUR CLOUDY (A) 02/15/2018 1920   LABSPEC 1.029 02/15/2018 1920   PHURINE 7.0 02/15/2018 1920   GLUCOSEU NEGATIVE 02/15/2018 1920   HGBUR NEGATIVE 02/15/2018 1920   BILIRUBINUR NEGATIVE 02/15/2018  1920   BILIRUBINUR neg 10/15/2014 1547   KETONESUR 5 (A) 02/15/2018 1920   PROTEINUR 30 (A) 02/15/2018 1920   UROBILINOGEN 0.2 10/25/2014 2110   NITRITE NEGATIVE 02/15/2018 1920   LEUKOCYTESUR NEGATIVE 02/15/2018 1920   Sepsis Labs Invalid input(s): PROCALCITONIN,  WBC,  LACTICIDVEN   Time coordinating discharge: 20 minutes  SIGNED:  Pamella Pert, MD  [facility] 02/17/2018, 11:17 AM Pager [phone]  If 7PM-7AM, please contact night-coverage [email or web address] Password [email or web address]

## 2018-02-17 NOTE — Progress Notes (Signed)
Pt has not ate since midnight, has been NPO incase of possible surgery.

## 2018-02-17 NOTE — Plan of Care (Addendum)
Bethany Gallagher 11-09-1990  [id number].      Chief Complaint: RUQ pain  Pt verbalized understanding of the plan of care. Pt continuously updated on results and tests. Pt symptoms are improving. PT denied pain throughout the shift. Pt was able to without complaint of nausea and vomiting. Pt denies weakness. PT afebrile. Pt kept NPO after midnight for probable surgery. Pt awaiting general surgery consult in morning. Pt stable, no acute distress noted. Pt is able to have normal bowel movements and passing flatus without difficulty.            Family History  Problem Relation Age of Onset  . Cancer Maternal Grandfather    . Asthma Brother    . Alcohol abuse Neg Hx    . Diabetes Neg Hx    . Heart disease Neg Hx    . Hypertension Neg Hx    . Stroke Neg Hx            Past Medical History:  Diagnosis Date  . Hx of chlamydia infection    . Hx of gonorrhea    . Pregnancy induced hypertension      2013   . Vaginal Pap smear, abnormal             Past Surgical History:  Procedure Laterality Date  . NO PAST SURGERIES           Allergies: No Known Allergies

## 2018-02-17 NOTE — ED Triage Notes (Signed)
Pt reports abd pain off/on X several weeks. Pt was seen this AM and told she had gallstones, was given the option for cholecystectomy or DC and she chose to leave. Pt states pain started after eating [employer] this evening.

## 2018-02-17 NOTE — Progress Notes (Signed)
[facility] Progress Note     Subjective: CC-  No complaints this morning. Denies any abdominal pain. States that she ate a sandwich/salad for lunch and [employer] for dinner and had no abdominal pain. No n/v. LFTs trending down. WBC WNL. Hepatitis panel negative. HIDA negative for acute cholecystitis, gallbladder ejection fraction may be below normal although a calculated number could not be performed.  Objective: Vital signs in last 24 hours: Temp:  [98 F (36.7 C)-98.7 F (37.1 C)] 98.7 F (37.1 C) (11/14 0551) Pulse Rate:  [65-98] 65 (11/14 0551) Resp:  [16] 16 (11/14 0551) BP: (109-120)/(69-92) 109/69 (11/14 0551) SpO2:  [99 %-100 %] 100 % (11/14 0551) Last BM Date: 02/16/18  Intake/Output from previous day: 11/13 0701 - 11/14 0700 In: 125 [P.O.:125] Out: -  Intake/Output this shift: No intake/output data recorded.  PE: Gen:  Alert, NAD, pleasant HEENT: EOM's intact, pupils equal and round Card:  RRR Pulm:  CTAB, no W/R/R, effort normal Abd: Soft, NT/ND, +BS, no HSM, no hernia Ext:  Calves soft and nontender Psych: A&Ox3  Skin: no rashes noted, warm and dry  Lab Results:  Recent Labs    02/15/18 1934 02/17/18 0330  WBC 3.8* 3.5*  HGB 11.0* 10.4*  HCT 35.2* 33.4*  PLT 307 287   BMET Recent Labs    02/16/18 0248 02/17/18 0330  NA 137 136  K 3.6 3.3*  CL 108 102  CO2 23 25  GLUCOSE 104* 95  BUN 5* 5*  CREATININE 0.77 0.90  CALCIUM 9.1 9.3   PT/INR No results for input(s): LABPROT, INR in the last 72 hours. CMP     Component Value Date/Time   NA 136 02/17/2018 0330   K 3.3 (L) 02/17/2018 0330   CL 102 02/17/2018 0330   CO2 25 02/17/2018 0330   GLUCOSE 95 02/17/2018 0330   BUN 5 (L) 02/17/2018 0330   CREATININE 0.90 02/17/2018 0330   CALCIUM 9.3 02/17/2018 0330   PROT 7.2 02/17/2018 0330   ALBUMIN 3.8 02/17/2018 0330   AST 338 (H) 02/17/2018 0330   ALT 399 (H) 02/17/2018 0330   ALKPHOS 201 (H) 02/17/2018 0330   BILITOT 0.8  02/17/2018 0330   GFRNONAA >60 02/17/2018 0330   GFRAA >60 02/17/2018 0330   Lipase     Component Value Date/Time   LIPASE 27 02/15/2018 1934       Studies/Results: Nm Hepato W/eject Fract  Result Date: 02/16/2018 CLINICAL DATA:  Right upper quadrant pain EXAM: NUCLEAR MEDICINE HEPATOBILIARY IMAGING WITH GALLBLADDER EF TECHNIQUE: Sequential images of the abdomen were obtained out to 60 minutes following intravenous administration of radiopharmaceutical. After oral ingestion of Ensure, gallbladder ejection fraction was determined. At 60 min, normal ejection fraction is greater than 33%. RADIOPHARMACEUTICALS:  Five mCi Tc-1560m  Choletec IV COMPARISON:  Ultrasound from the previous day FINDINGS: Prompt uptake and biliary excretion of activity by the liver is seen. Gallbladder activity is visualized, consistent with patency of cystic duct. Biliary activity passes into small bowel, consistent with patent common bile duct. Calculated gallbladder ejection fraction could not be adequately performed due to the significant patient motion. Evaluation of the send a images suggests some gallbladder emptying although the gallbladder ejection fraction is likely below normal. IMPRESSION: Normal uptake and excretion of biliary tracer. Gallbladder ejection fraction is felt to be below normal although a calculated number could not be performed due to significant patient motion artifact. Electronically Signed   By: Alcide CleverMark  Lukens M.D.   On:  02/16/2018 15:36   [place] Abdomen Limited Ruq  Result Date: 02/15/2018 CLINICAL DATA:  Right upper quadrant abdominal pain, elevated liver function test. EXAM: ULTRASOUND ABDOMEN LIMITED RIGHT UPPER QUADRANT COMPARISON:  None. FINDINGS: Gallbladder: Cholelithiasis is noted without significant gallbladder wall thickening. No pericholecystic fluid or sonographic Murphy's sign is noted. Common bile duct: Diameter: 5 mm which is within normal limits. Liver: No focal lesion identified.  Within normal limits in parenchymal echogenicity. Portal vein is patent on color Doppler imaging with normal direction of blood flow towards the liver. IMPRESSION: Cholelithiasis without evidence of cholecystitis. No other significant abnormality seen in the right upper quadrant of the abdomen. Electronically Signed   By: Lupita Raider, M.D.   On: 02/15/2018 21:43    Anti-infectives: Anti-infectives (From admission, onward)   None       Assessment/Plan RUQ pain, nausea, vomiting - resolved Cholelithiasis Transaminitis - trending down. Hepatitis panel negative - HIDA negative for acute cholecystitis. Patient may have biliary dyskinesia, but gallbladder EF could not be calculated. Discussed options of either proceeding with lap chole this admission versus following up outpatient. She is completely nontender today and has tolerated a regular diet. She would like to f/u outpatient. I will put this information in her AVS. Stable for discharge from surgical standpoint.  ID - none VTE - SCDs, per primary FEN - reg diet Foley - none Follow up - Dr. Fredricka Bonine   LOS: 0 days    Franne Forts , [facility] 02/17/2018, 8:12 AM Pager: [phone] Mon 7:00 am -11:30 AM Tues-Fri 7:00 am-4:30 pm Sat-Sun 7:00 am-11:30 am

## 2018-02-17 NOTE — Discharge Instructions (Signed)
Biliary Colic, Adult °Biliary colic is severe pain caused by a problem with a small organ in the upper right part of your belly (gallbladder). The gallbladder stores a digestive fluid produced in the liver (bile) that helps the body break down fat. Bile and other digestive enzymes are carried from the liver to the small intestine though tube-like structures (bile ducts). The gallbladder and the bile ducts form the biliary tract. °Sometimes hard deposits of digestive fluids form in the gallbladder (gallstones) and block the flow of bile from the gallbladder, causing biliary colic. This condition is also called a gallbladder attack. Gallstones can be as small as a grain of sand or as big as a golf ball. There could be just one gallstone in the gallbladder, or there could be many. °What are the causes? °Biliary colic is usually caused by gallstones. Less often, a tumor could block the flow of bile from the gallbladder and trigger biliary colic. °What increases the risk? °This condition is more likely to develop in: °· Women. °· People of Hispanic descent. °· People with a family history of gallstones. °· People who are obese. °· People who suddenly or quickly lose weight. °· People who eat a high-calorie, low-fiber diet that is rich in refined carbs (carbohydrates), such as white bread and white rice. °· People who have an intestinal disease that affects nutrient absorption, such as Crohn disease. °· People who have a metabolic condition, such as metabolic syndrome or diabetes. ° °What are the signs or symptoms? °Severe pain in the upper right side of the belly is the main symptom of biliary colic. You may feel this pain below the chest but above the hip. This pain often occurs at night or after eating a very fatty meal. This pain may get worse for up to an hour and last as long as 12 hours. In most cases, the pain fades (subsides) within a couple hours. °Other symptoms of this condition include: °· Nausea and  vomiting. °· Pain under the right shoulder. ° °How is this diagnosed? °This condition is diagnosed based on your medical history, your symptoms, and a physical exam. You may have tests, including: °· Blood tests to rule out infection or inflammation of the bile ducts, gallbladder, pancreas, or liver. °· Imaging studies such as: °? Ultrasound. °? CT scan. °? MRI. ° °In some cases, you may need to have an imaging study done using a small amount of radioactive material (nuclear medicine) to confirm the diagnosis. °How is this treated? °Treatment for this condition may include medicine to relieve your pain or nausea. If you have gallstones that are causing biliary colic, you may need surgery to remove the gallbladder (cholecystectomy). Gallstones can also be dissolved gradually with medicine. It may take months or years before the gallstones are completely gone. °Follow these instructions at home: °· Take over-the-counter and prescription medicines only as told by your health care provider. °· Drink enough fluid to keep your urine clear or pale yellow. °· Follow instructions from your health care provider about eating or drinking restrictions. These may include avoiding: °? Fatty, greasy, and fried foods. °? Any foods that make the pain worse. °? Overeating. °? Having a large meal after not eating for a while. °· Keep all follow-up visits as told by your health care provider. This is important. °How is this prevented? °Steps to prevent this condition include: °· Maintaining a healthy body weight. °· Getting regular exercise. °· Eating a healthy, high-fiber, low-fat diet. °· Limiting   how much sugar and refined carbs you eat, such as sweets, white flour, and white rice. ° °Contact a health care provider if: °· Your pain lasts more than 5 hours. °· You vomit. °· You have a fever and chills. °· Your pain gets worse. °Get help right away if: °· Your skin or the whites of your eyes look yellow (jaundice). °· Your have  tea-colored urine and light-colored stools. °· You are dizzy or you faint. °This information is not intended to replace advice given to you by your health care provider. Make sure you discuss any questions you have with your health care provider. °Document Released: 08/24/2005 Document Revised: 11/19/2015 Document Reviewed: 10/07/2015 °Elsevier Interactive Patient Education © 2018 [employer] ° °

## 2018-02-18 ENCOUNTER — Encounter (HOSPITAL_COMMUNITY): Admission: EM | Disposition: A | Discharge status: Home / Self Care | Payer: Self-pay | Source: Home / Self Care

## 2018-02-18 ENCOUNTER — Other Ambulatory Visit: Payer: Self-pay

## 2018-02-18 ENCOUNTER — Inpatient Hospital Stay (HOSPITAL_COMMUNITY): Payer: Medicaid Other | Admitting: Certified Registered Nurse Anesthetist

## 2018-02-18 ENCOUNTER — Encounter (HOSPITAL_COMMUNITY): Payer: Self-pay | Admitting: Surgery

## 2018-02-18 ENCOUNTER — Emergency Department (HOSPITAL_COMMUNITY): Payer: Medicaid Other

## 2018-02-18 DIAGNOSIS — K219 Gastro-esophageal reflux disease without esophagitis: Secondary | ICD-10-CM | POA: Diagnosis present

## 2018-02-18 DIAGNOSIS — Z87891 Personal history of nicotine dependence: Secondary | ICD-10-CM | POA: Diagnosis not present

## 2018-02-18 DIAGNOSIS — Z6820 Body mass index (BMI) 20.0-20.9, adult: Secondary | ICD-10-CM | POA: Diagnosis not present

## 2018-02-18 DIAGNOSIS — K812 Acute cholecystitis with chronic cholecystitis: Secondary | ICD-10-CM | POA: Diagnosis present

## 2018-02-18 DIAGNOSIS — R627 Adult failure to thrive: Secondary | ICD-10-CM | POA: Diagnosis present

## 2018-02-18 DIAGNOSIS — R1011 Right upper quadrant pain: Secondary | ICD-10-CM | POA: Diagnosis present

## 2018-02-18 DIAGNOSIS — I10 Essential (primary) hypertension: Secondary | ICD-10-CM | POA: Diagnosis present

## 2018-02-18 DIAGNOSIS — K8012 Calculus of gallbladder with acute and chronic cholecystitis without obstruction: Secondary | ICD-10-CM | POA: Diagnosis present

## 2018-02-18 HISTORY — PX: CHOLECYSTECTOMY: SHX55

## 2018-02-18 LAB — LIPASE, BLOOD: Lipase: 36 U/L (ref 11–51)

## 2018-02-18 LAB — ACETAMINOPHEN LEVEL: Acetaminophen (Tylenol), Serum: <10 ug/mL — ABNORMAL LOW (ref 10–30)

## 2018-02-18 LAB — URINALYSIS, ROUTINE W REFLEX MICROSCOPIC
Bilirubin Urine: NEGATIVE
Glucose, UA: NEGATIVE mg/dL
Hgb urine dipstick: NEGATIVE
Ketones, ur: NEGATIVE mg/dL
Leukocytes, UA: NEGATIVE
Nitrite: NEGATIVE
Protein, ur: NEGATIVE mg/dL
Specific Gravity, Urine: 1.025 (ref 1.005–1.030)
pH: 6 (ref 5.0–8.0)

## 2018-02-18 LAB — COMPREHENSIVE METABOLIC PANEL
ALT: 622 U/L — ABNORMAL HIGH (ref 0–44)
AST: 627 U/L — ABNORMAL HIGH (ref 15–41)
Albumin: 4.2 g/dL (ref 3.5–5.0)
Alkaline Phosphatase: 251 U/L — ABNORMAL HIGH (ref 38–126)
Anion gap: 11 (ref 5–15)
BUN: 6 mg/dL (ref 6–20)
CO2: 23 mmol/L (ref 22–32)
Calcium: 9.8 mg/dL (ref 8.9–10.3)
Chloride: 105 mmol/L (ref 98–111)
Creatinine, Ser: 0.83 mg/dL (ref 0.44–1.00)
GFR calc Af Amer: >60 mL/min (ref >60)
GFR calc non Af Amer: >60 mL/min (ref >60)
Glucose, Bld: 103 mg/dL — ABNORMAL HIGH (ref 70–99)
Potassium: 3.6 mmol/L (ref 3.5–5.1)
Sodium: 139 mmol/L (ref 135–145)
Total Bilirubin: 0.7 mg/dL (ref 0.3–1.2)
Total Protein: 7.7 g/dL (ref 6.5–8.1)

## 2018-02-18 LAB — CBC
HCT: 34 % — ABNORMAL LOW (ref 36.0–46.0)
Hemoglobin: 10.8 g/dL — ABNORMAL LOW (ref 12.0–15.0)
MCH: 27 pg (ref 26.0–34.0)
MCHC: 31.8 g/dL (ref 30.0–36.0)
MCV: 85 fL (ref 80.0–100.0)
Platelets: 315 10*3/uL (ref 150–400)
RBC: 4 MIL/uL (ref 3.87–5.11)
RDW: 14.1 % (ref 11.5–15.5)
WBC: 4.6 10*3/uL (ref 4.0–10.5)
nRBC: 0 % (ref 0.0–0.2)

## 2018-02-18 LAB — I-STAT BETA HCG BLOOD, ED (MC, WL, AP ONLY): I-stat hCG, quantitative: <5 m[IU]/mL (ref <5)

## 2018-02-18 LAB — SURGICAL PCR SCREEN
MRSA, PCR: NEGATIVE
Staphylococcus aureus: NEGATIVE

## 2018-02-18 SURGERY — LAPAROSCOPIC CHOLECYSTECTOMY WITH INTRAOPERATIVE CHOLANGIOGRAM
Anesthesia: General

## 2018-02-18 MED ORDER — HEPARIN SODIUM (PORCINE) 5000 UNIT/ML IJ SOLN
5000.0000 [IU] | Freq: Three times a day (TID) | INTRAMUSCULAR | Status: DC
  Administered 2018-02-19 – 2018-02-21 (×5): 5000 [IU] via SUBCUTANEOUS
  Filled 2018-02-18 (×5): qty 1

## 2018-02-18 MED ORDER — DEXAMETHASONE SODIUM PHOSPHATE 10 MG/ML IJ SOLN
INTRAMUSCULAR | Status: AC
  Filled 2018-02-18: qty 1

## 2018-02-18 MED ORDER — PROMETHAZINE HCL 25 MG/ML IJ SOLN: 12.5000 mg | Freq: Once | INTRAMUSCULAR | Status: DC

## 2018-02-18 MED ORDER — HYDROMORPHONE HCL 1 MG/ML IJ SOLN
INTRAMUSCULAR | Status: AC
  Administered 2018-02-18: 0.5 mg via INTRAVENOUS
  Filled 2018-02-18: qty 1

## 2018-02-18 MED ORDER — DEXAMETHASONE SODIUM PHOSPHATE 10 MG/ML IJ SOLN
INTRAMUSCULAR | Status: DC | PRN
  Administered 2018-02-18: 10 mg via INTRAVENOUS

## 2018-02-18 MED ORDER — LIDOCAINE 2% (20 MG/ML) 5 ML SYRINGE
INTRAMUSCULAR | Status: DC | PRN
  Administered 2018-02-18: 100 mg via INTRAVENOUS

## 2018-02-18 MED ORDER — DIPHENHYDRAMINE HCL 50 MG/ML IJ SOLN
12.5000 mg | Freq: Four times a day (QID) | INTRAMUSCULAR | Status: DC | PRN
  Administered 2018-02-18: 12.5 mg via INTRAVENOUS
  Filled 2018-02-18: qty 1

## 2018-02-18 MED ORDER — CELECOXIB 200 MG PO CAPS
200.0000 mg | ORAL_CAPSULE | ORAL | Status: AC
  Administered 2018-02-18: 200 mg via ORAL
  Filled 2018-02-18: qty 1

## 2018-02-18 MED ORDER — HYDROMORPHONE HCL 1 MG/ML IJ SOLN
0.5000 mg | Freq: Once | INTRAMUSCULAR | Status: AC
  Administered 2018-02-18: 0.5 mg via INTRAVENOUS
  Filled 2018-02-18: qty 1

## 2018-02-18 MED ORDER — HYDROMORPHONE HCL 1 MG/ML IJ SOLN
0.5000 mg | INTRAMUSCULAR | Status: DC | PRN
  Administered 2018-02-18 – 2018-02-19 (×4): 0.5 mg via INTRAVENOUS
  Filled 2018-02-18 (×4): qty 1

## 2018-02-18 MED ORDER — TRAMADOL HCL 50 MG PO TABS: 50.0000 mg | ORAL_TABLET | Freq: Four times a day (QID) | ORAL | Status: DC | PRN

## 2018-02-18 MED ORDER — ONDANSETRON HCL 4 MG/2ML IJ SOLN: 4.0000 mg | Freq: Once | INTRAMUSCULAR | Status: DC | PRN

## 2018-02-18 MED ORDER — DIPHENHYDRAMINE HCL 12.5 MG/5ML PO ELIX: 12.5000 mg | ORAL_SOLUTION | Freq: Four times a day (QID) | ORAL | Status: DC | PRN

## 2018-02-18 MED ORDER — ONDANSETRON HCL 4 MG/2ML IJ SOLN
INTRAMUSCULAR | Status: DC | PRN
  Administered 2018-02-18: 4 mg via INTRAVENOUS

## 2018-02-18 MED ORDER — SUGAMMADEX SODIUM 200 MG/2ML IV SOLN
INTRAVENOUS | Status: DC | PRN
  Administered 2018-02-18: 115 mg via INTRAVENOUS

## 2018-02-18 MED ORDER — HYDROMORPHONE HCL 1 MG/ML IJ SOLN
0.2500 mg | INTRAMUSCULAR | Status: DC | PRN
  Administered 2018-02-18 (×2): 0.5 mg via INTRAVENOUS

## 2018-02-18 MED ORDER — FENTANYL CITRATE (PF) 250 MCG/5ML IJ SOLN
INTRAMUSCULAR | Status: AC
  Filled 2018-02-18: qty 5

## 2018-02-18 MED ORDER — PROPOFOL 10 MG/ML IV BOLUS
INTRAVENOUS | Status: AC
  Filled 2018-02-18: qty 40

## 2018-02-18 MED ORDER — SODIUM CHLORIDE 0.9 % IV SOLN
2.0000 g | INTRAVENOUS | Status: AC
  Administered 2018-02-18: 2 g via INTRAVENOUS
  Filled 2018-02-18 (×2): qty 20

## 2018-02-18 MED ORDER — CHLORHEXIDINE GLUCONATE CLOTH 2 % EX PADS: 6.0000 | MEDICATED_PAD | Freq: Once | CUTANEOUS | Status: DC

## 2018-02-18 MED ORDER — MIDAZOLAM HCL 2 MG/2ML IJ SOLN
INTRAMUSCULAR | Status: DC | PRN
  Administered 2018-02-18: 2 mg via INTRAVENOUS

## 2018-02-18 MED ORDER — FENTANYL CITRATE (PF) 100 MCG/2ML IJ SOLN
INTRAMUSCULAR | Status: DC | PRN
  Administered 2018-02-18: 50 ug via INTRAVENOUS
  Administered 2018-02-18: 100 ug via INTRAVENOUS

## 2018-02-18 MED ORDER — IBUPROFEN 600 MG PO TABS: 600.0000 mg | ORAL_TABLET | Freq: Four times a day (QID) | ORAL | Status: DC | PRN

## 2018-02-18 MED ORDER — ROCURONIUM BROMIDE 10 MG/ML (PF) SYRINGE
PREFILLED_SYRINGE | INTRAVENOUS | Status: DC | PRN
  Administered 2018-02-18: 50 mg via INTRAVENOUS

## 2018-02-18 MED ORDER — SODIUM CHLORIDE 0.9 % IV SOLN
INTRAVENOUS | Status: DC
  Administered 2018-02-18: 10:00:00 via INTRAVENOUS

## 2018-02-18 MED ORDER — LACTATED RINGERS IV SOLN
INTRAVENOUS | Status: DC
  Administered 2018-02-18: 10:00:00 via INTRAVENOUS

## 2018-02-18 MED ORDER — DOCUSATE SODIUM 100 MG PO CAPS
100.0000 mg | ORAL_CAPSULE | Freq: Two times a day (BID) | ORAL | Status: DC
  Administered 2018-02-18: 100 mg via ORAL
  Filled 2018-02-18: qty 1

## 2018-02-18 MED ORDER — ONDANSETRON 4 MG PO TBDP
4.0000 mg | ORAL_TABLET | Freq: Four times a day (QID) | ORAL | Status: DC | PRN
  Administered 2018-02-19 – 2018-02-20 (×2): 4 mg via ORAL
  Filled 2018-02-18 (×2): qty 1

## 2018-02-18 MED ORDER — SODIUM CHLORIDE 0.9 % IV SOLN: 2.0000 g | INTRAVENOUS | Status: DC

## 2018-02-18 MED ORDER — ONDANSETRON HCL 4 MG/2ML IJ SOLN
INTRAMUSCULAR | Status: AC
  Filled 2018-02-18: qty 2

## 2018-02-18 MED ORDER — ACETAMINOPHEN 500 MG PO TABS
1000.0000 mg | ORAL_TABLET | ORAL | Status: AC
  Administered 2018-02-18: 1000 mg via ORAL
  Filled 2018-02-18: qty 2

## 2018-02-18 MED ORDER — SODIUM CHLORIDE 0.9 % IR SOLN
Status: DC | PRN
  Administered 2018-02-18: 1000 mL

## 2018-02-18 MED ORDER — MIDAZOLAM HCL 2 MG/2ML IJ SOLN
INTRAMUSCULAR | Status: AC
  Filled 2018-02-18: qty 2

## 2018-02-18 MED ORDER — SUGAMMADEX SODIUM 200 MG/2ML IV SOLN
INTRAVENOUS | Status: AC
  Filled 2018-02-18: qty 2

## 2018-02-18 MED ORDER — HYDROMORPHONE HCL 1 MG/ML IJ SOLN
0.5000 mg | INTRAMUSCULAR | Status: DC | PRN
  Administered 2018-02-18 (×2): 0.5 mg via INTRAVENOUS

## 2018-02-18 MED ORDER — BUPIVACAINE-EPINEPHRINE 0.25% -1:200000 IJ SOLN
INTRAMUSCULAR | Status: DC | PRN
  Administered 2018-02-18: 7 mL

## 2018-02-18 MED ORDER — HYDRALAZINE HCL 20 MG/ML IJ SOLN: 10.0000 mg | INTRAMUSCULAR | Status: DC | PRN

## 2018-02-18 MED ORDER — ACETAMINOPHEN 500 MG PO TABS
1000.0000 mg | ORAL_TABLET | Freq: Four times a day (QID) | ORAL | Status: DC
  Administered 2018-02-18 – 2018-02-21 (×9): 1000 mg via ORAL
  Filled 2018-02-18 (×10): qty 2

## 2018-02-18 MED ORDER — PROPOFOL 10 MG/ML IV BOLUS
INTRAVENOUS | Status: DC | PRN
  Administered 2018-02-18: 140 mg via INTRAVENOUS

## 2018-02-18 MED ORDER — LIDOCAINE 2% (20 MG/ML) 5 ML SYRINGE
INTRAMUSCULAR | Status: AC
  Filled 2018-02-18: qty 5

## 2018-02-18 MED ORDER — 0.9 % SODIUM CHLORIDE (POUR BTL) OPTIME
TOPICAL | Status: DC | PRN
  Administered 2018-02-18: 1000 mL

## 2018-02-18 MED ORDER — GABAPENTIN 300 MG PO CAPS
300.0000 mg | ORAL_CAPSULE | ORAL | Status: AC
  Administered 2018-02-18: 300 mg via ORAL
  Filled 2018-02-18: qty 1

## 2018-02-18 MED ORDER — ONDANSETRON HCL 4 MG/2ML IJ SOLN
4.0000 mg | Freq: Four times a day (QID) | INTRAMUSCULAR | Status: DC | PRN
  Administered 2018-02-18 – 2018-02-19 (×2): 4 mg via INTRAVENOUS
  Filled 2018-02-18 (×2): qty 2

## 2018-02-18 MED ORDER — METOCLOPRAMIDE HCL 5 MG/ML IJ SOLN
10.0000 mg | Freq: Once | INTRAMUSCULAR | Status: AC
  Administered 2018-02-18: 10 mg via INTRAVENOUS
  Filled 2018-02-18: qty 2

## 2018-02-18 MED ORDER — TRAMADOL HCL 50 MG PO TABS
50.0000 mg | ORAL_TABLET | ORAL | Status: DC | PRN
  Administered 2018-02-18: 50 mg via ORAL
  Filled 2018-02-18: qty 1

## 2018-02-18 MED ORDER — MEPERIDINE HCL 50 MG/ML IJ SOLN: 6.2500 mg | INTRAMUSCULAR | Status: DC | PRN

## 2018-02-18 MED ORDER — SIMETHICONE 80 MG PO CHEW
40.0000 mg | CHEWABLE_TABLET | Freq: Four times a day (QID) | ORAL | Status: DC | PRN
  Filled 2018-02-18 (×2): qty 1

## 2018-02-18 MED ORDER — ROCURONIUM BROMIDE 50 MG/5ML IV SOSY
PREFILLED_SYRINGE | INTRAVENOUS | Status: AC
  Filled 2018-02-18: qty 5

## 2018-02-18 SURGICAL SUPPLY — 45 items
ADH SKN CLS APL DERMABOND .7 (GAUZE/BANDAGES/DRESSINGS) ×1
ADH SKN CLS LQ APL DERMABOND (GAUZE/BANDAGES/DRESSINGS) ×1
APPLIER CLIP 5 13 M/L LIGAMAX5 (MISCELLANEOUS) ×2
APR CLP MED LRG 5 ANG JAW (MISCELLANEOUS) ×1
BAG SPEC RTRVL LRG 6X4 10 (ENDOMECHANICALS) ×1
CANISTER SUCT 3000ML PPV (MISCELLANEOUS) ×2 IMPLANT
CHLORAPREP W/TINT 26ML (MISCELLANEOUS) ×2 IMPLANT
CLIP APPLIE 5 13 M/L LIGAMAX5 (MISCELLANEOUS) ×1 IMPLANT
CONT SPEC 4OZ CLIKSEAL STRL BL (MISCELLANEOUS) ×2 IMPLANT
COVER MAYO STAND STRL (DRAPES) ×1 IMPLANT
COVER SURGICAL LIGHT HANDLE (MISCELLANEOUS) ×2 IMPLANT
COVER WAND RF STERILE (DRAPES) ×2 IMPLANT
DERMABOND ADHESIVE PROPEN (GAUZE/BANDAGES/DRESSINGS) ×1
DERMABOND ADVANCED (GAUZE/BANDAGES/DRESSINGS) ×1
DERMABOND ADVANCED .7 DNX12 (GAUZE/BANDAGES/DRESSINGS) ×1 IMPLANT
DERMABOND ADVANCED .7 DNX6 (GAUZE/BANDAGES/DRESSINGS) IMPLANT
DRAPE C-ARM 42X72 X-RAY (DRAPES) ×1 IMPLANT
ELECT REM PT RETURN 9FT ADLT (ELECTROSURGICAL) ×2
ELECTRODE REM PT RTRN 9FT ADLT (ELECTROSURGICAL) ×1 IMPLANT
ENDOLOOP SUT PDS II  0 18 (SUTURE) ×1
ENDOLOOP SUT PDS II 0 18 (SUTURE) IMPLANT
GLOVE BIO SURGEON STRL SZ 6 (GLOVE) ×2 IMPLANT
GLOVE INDICATOR 6.5 STRL GRN (GLOVE) ×2 IMPLANT
GOWN STRL REUS W/ TWL LRG LVL3 (GOWN DISPOSABLE) ×3 IMPLANT
GOWN STRL REUS W/TWL LRG LVL3 (GOWN DISPOSABLE) ×6
GRASPER SUT TROCAR 14GX15 (MISCELLANEOUS) ×2 IMPLANT
KIT BASIN OR (CUSTOM PROCEDURE TRAY) ×2 IMPLANT
KIT TURNOVER KIT B (KITS) ×2 IMPLANT
NDL INSUFFLATION 14GA 120MM (NEEDLE) ×1 IMPLANT
NEEDLE INSUFFLATION 14GA 120MM (NEEDLE) ×2 IMPLANT
NS IRRIG 1000ML POUR BTL (IV SOLUTION) ×2 IMPLANT
PAD ARMBOARD 7.5X6 YLW CONV (MISCELLANEOUS) ×2 IMPLANT
POUCH SPECIMEN RETRIEVAL 10MM (ENDOMECHANICALS) ×2 IMPLANT
SCISSORS LAP 5X35 DISP (ENDOMECHANICALS) ×2 IMPLANT
SET CHOLANGIOGRAPH 5 50 .035 (SET/KITS/TRAYS/PACK) ×1 IMPLANT
SET IRRIG TUBING LAPAROSCOPIC (IRRIGATION / IRRIGATOR) ×2 IMPLANT
SLEEVE ENDOPATH XCEL 5M (ENDOMECHANICALS) ×4 IMPLANT
SUT MNCRL AB 4-0 PS2 18 (SUTURE) ×2 IMPLANT
TOWEL OR 17X24 6PK STRL BLUE (TOWEL DISPOSABLE) ×2 IMPLANT
TOWEL OR 17X26 10 PK STRL BLUE (TOWEL DISPOSABLE) ×2 IMPLANT
TRAY LAPAROSCOPIC MC (CUSTOM PROCEDURE TRAY) ×2 IMPLANT
TROCAR XCEL NON-BLD 11X100MML (ENDOMECHANICALS) ×2 IMPLANT
TROCAR XCEL NON-BLD 5MMX100MML (ENDOMECHANICALS) ×2 IMPLANT
TUBING INSUFFLATION (TUBING) ×2 IMPLANT
WATER STERILE IRR 1000ML POUR (IV SOLUTION) ×2 IMPLANT

## 2018-02-18 NOTE — Interval H&P Note (Signed)
History and Physical Interval Note:  02/18/2018 9:27 AM  Bethany Gallagher  has presented today for surgery, with the diagnosis of Biliary colic  The various methods of treatment have been discussed with the patient and family. After consideration of risks, benefits and other options for treatment, the patient has consented to  Procedure(s): LAPAROSCOPIC CHOLECYSTECTOMY WITH INTRAOPERATIVE CHOLANGIOGRAM (N/A) as a surgical intervention .  The patient's history has been reviewed, patient examined, no change in status, stable for surgery.  I have reviewed the patient's chart and labs.  Questions were answered to the patient's satisfaction.     Chelsea Lollie SailsA Connor

## 2018-02-18 NOTE — ED Provider Notes (Signed)
[facility] EM[facility]ENC[facility] DEPARTMENT Provider Note   CSN: [id number] Arrival date & time: 02/17/18  2330     History   Chief Complaint Chief Complaint  Patient presents with  . Abdominal Pain    HPI Bethany Gallagher is a 27 y.o. female.  Patient returns to the ED with RUQ abdominal pain described as sharp and intense, associated with nausea and vomiting. No fever. She has had multiple work ups this month for same, including hospital admission with discharge 12 hours ago. She has been found to have gall stones without evidence of cholelithiasis without cholecystitis on HIDA scan performed 02/16/18. During this admission she had surgical consultation as well which resulted in plan for elective cholecystectomy in the outpatient setting. She states her appointment with surgery is scheduled for tomorrow. Today, after discharge, she ate Janine Limbo for lunch and this evening is having recurrent, RUQ pain and vomiting. No fever. No hematemesis. She has used Zofran at home with some relief of nausea.   The history is provided by the patient. No language interpreter was used.  Abdominal Pain   Associated symptoms include nausea and vomiting. Pertinent negatives include fever.    Past Medical History:  Diagnosis Date  . GERD (gastroesophageal reflux disease)   . Hx of chlamydia infection   . Hx of gonorrhea   . Pregnancy induced hypertension    2013   . RUQ abdominal pain 02/2018  . Vaginal Pap smear, abnormal     Patient Active Problem List   Diagnosis Date Noted  . Colicky RUQ abdominal pain 02/15/2018  . Transaminitis 02/15/2018  . Supervision of normal pregnancy in third trimester 07/22/2017  . Mild tetrahydrocannabinol (THC) abuse 04/13/2016  . NSVD (normal spontaneous vaginal delivery) 11/23/2014    Past Surgical History:  Procedure Laterality Date  . NO PAST SURGERIES       OB History    Gravida  5   Para  5   Term  5   Preterm  0   AB  0   Living  5     SAB  0   TAB  0   Ectopic  0   Multiple  0   Live Births  5            Home Medications    Prior to Admission medications   Medication Sig Start Date End Date Taking? Authorizing Provider  ondansetron (ZOFRAN) 4 MG tablet Take 1 tablet (4 mg total) by mouth every 6 (six) hours as needed for nausea. 02/17/18   Caren Griffins, MD    Family History Family History  Problem Relation Age of Onset  . Cancer Maternal Grandfather   . Asthma Brother   . Alcohol abuse Neg Hx   . Diabetes Neg Hx   . Heart disease Neg Hx   . Hypertension Neg Hx   . Stroke Neg Hx     Social History Social History   Tobacco Use  . Smoking status: Former Smoker    Types: Cigarettes  . Smokeless tobacco: Former Systems developer    Quit date: 11/05/2015  . Tobacco comment: with + preg test  Substance Use Topics  . Alcohol use: Not Currently  . Drug use: Yes    Types: Marijuana    Comment: denies use of marajuana with current pregnancy     Allergies   Patient has no known allergies.   Review of Systems Review of Systems  Constitutional: Negative for chills and fever.  HENT: Negative.   Respiratory: Negative.   Cardiovascular: Negative.   Gastrointestinal: Positive for abdominal pain, nausea and vomiting.  Musculoskeletal: Negative.   Skin: Negative.   Neurological: Negative.      Physical Exam Updated Vital Signs BP (!) 127/91 (BP Location: Right Arm)   Pulse 93   Temp 98.2 F (36.8 C) (Oral)   Resp 18   Ht 5' 5"  (1.651 m)   Wt 56.7 kg   LMP 02/02/2018   SpO2 100%   BMI 20.80 kg/m   Physical Exam  Constitutional: She appears well-developed and well-nourished.  HENT:  Head: Normocephalic.  Neck: Normal range of motion. Neck supple.  Cardiovascular: Normal rate and regular rhythm.  Pulmonary/Chest: Effort normal and breath sounds normal.  Abdominal: Soft. Bowel sounds are normal. There is tenderness in the right upper quadrant. There is guarding.    Musculoskeletal: Normal range of motion.  Neurological: She is alert. No cranial nerve deficit.  Skin: Skin is warm and dry. No rash noted.  Psychiatric: She has a normal mood and affect.  Nursing note and vitals reviewed.    ED Treatments / Results  Labs (all labs ordered are listed, but only abnormal results are displayed) Labs Reviewed  CBC - Abnormal; Notable for the following components:      Result Value   Hemoglobin 10.8 (*)    HCT 34.0 (*)    All other components within normal limits  URINALYSIS, ROUTINE W REFLEX MICROSCOPIC - Abnormal; Notable for the following components:   Color, Urine AMBER (*)    APPearance CLOUDY (*)    All other components within normal limits  LIPASE, BLOOD  COMPREHENSIVE METABOLIC PANEL  I-STAT BETA HCG BLOOD, ED ([facility], [facility], [facility] ONLY)   Results for orders placed or performed during the hospital encounter of 02/17/18  Lipase, blood  Result Value Ref Range   Lipase 36 11 - 51 U/L  Comprehensive metabolic panel  Result Value Ref Range   Sodium 139 135 - 145 mmol/L   Potassium 3.6 3.5 - 5.1 mmol/L   Chloride 105 98 - 111 mmol/L   CO2 23 22 - 32 mmol/L   Glucose, Bld 103 (H) 70 - 99 mg/dL   BUN 6 6 - 20 mg/dL   Creatinine, Ser 0.83 0.44 - 1.00 mg/dL   Calcium 9.8 8.9 - 10.3 mg/dL   Total Protein 7.7 6.5 - 8.1 g/dL   Albumin 4.2 3.5 - 5.0 g/dL   AST 627 (H) 15 - 41 U/L   ALT 622 (H) 0 - 44 U/L   Alkaline Phosphatase 251 (H) 38 - 126 U/L   Total Bilirubin 0.7 0.3 - 1.2 mg/dL   GFR calc non Af Amer >60 >60 mL/min   GFR calc Af Amer >60 >60 mL/min   Anion gap 11 5 - 15  CBC  Result Value Ref Range   WBC 4.6 4.0 - 10.5 K/uL   RBC 4.00 3.87 - 5.11 MIL/uL   Hemoglobin 10.8 (L) 12.0 - 15.0 g/dL   HCT 34.0 (L) 36.0 - 46.0 %   MCV 85.0 80.0 - 100.0 fL   MCH 27.0 26.0 - 34.0 pg   MCHC 31.8 30.0 - 36.0 g/dL   RDW 14.1 11.5 - 15.5 %   Platelets 315 150 - 400 K/uL   nRBC 0.0 0.0 - 0.2 %  Urinalysis, Routine w reflex microscopic  Result Value  Ref Range   Color, Urine AMBER (A) YELLOW   APPearance CLOUDY (A) CLEAR  Specific Gravity, Urine 1.025 1.005 - 1.030   pH 6.0 5.0 - 8.0   Glucose, UA NEGATIVE NEGATIVE mg/dL   Hgb urine dipstick NEGATIVE NEGATIVE   Bilirubin Urine NEGATIVE NEGATIVE   Ketones, ur NEGATIVE NEGATIVE mg/dL   Protein, ur NEGATIVE NEGATIVE mg/dL   Nitrite NEGATIVE NEGATIVE   Leukocytes, UA NEGATIVE NEGATIVE  I-Stat beta hCG blood, ED  Result Value Ref Range   I-stat hCG, quantitative <5.0 <5 mIU/mL   Comment 3            EKG None  Radiology Nm Hepato W/eject Fract  Result Date: 02/16/2018 CLINICAL DATA:  Right upper quadrant pain EXAM: NUCLEAR MEDICINE HEPATOBILIARY IMAGING WITH GALLBLADDER EF TECHNIQUE: Sequential images of the abdomen were obtained out to 60 minutes following intravenous administration of radiopharmaceutical. After oral ingestion of Ensure, gallbladder ejection fraction was determined. At 60 min, normal ejection fraction is greater than 33%. RADIOPHARMACEUTICALS:  Five mCi Tc-32m Choletec IV COMPARISON:  Ultrasound from the previous day FINDINGS: Prompt uptake and biliary excretion of activity by the liver is seen. Gallbladder activity is visualized, consistent with patency of cystic duct. Biliary activity passes into small bowel, consistent with patent common bile duct. Calculated gallbladder ejection fraction could not be adequately performed due to the significant patient motion. Evaluation of the send a images suggests some gallbladder emptying although the gallbladder ejection fraction is likely below normal. IMPRESSION: Normal uptake and excretion of biliary tracer. Gallbladder ejection fraction is felt to be below normal although a calculated number could not be performed due to significant patient motion artifact. Electronically Signed   By: MInez CatalinaM.D.   On: 02/16/2018 15:36    Procedures Procedures (including critical care time)  Medications Ordered in ED Medications    metoCLOPramide (REGLAN) injection 10 mg (has no administration in time range)     Initial Impression / Assessment and Plan / ED Course  I have reviewed the triage vital signs and the nursing notes.  Pertinent labs & imaging results that were available during my care of the patient were reviewed by me and considered in my medical decision making (see chart for details).     Patient returns to the ED with RUQ abdominal pain, nausea and vomiting. Recent diagnosis of cholelithiasis without cholecystitis on HIDA scan (02/16/18), discharged home yesterday in favor of elective surgery, scheduled to see surgery today at 1:00.   She is nontoxic in appearance. No fever or leukocytosis. Normal total bilirubin despite repeat labs showing worsening of transaminases and alk phos. Nausea controlled with reglan, pain improved with Dilaudid, however, the patient remains tender on exam.   surgery paged for further consultation.  2:45 - Per Dr. WDema Severin- general surgery, [place].   4:45 - surgery updated on ultrasound results. He will evaluate in the ED for admission. Patient aware of plan. Continues to be tender to RUQ. No nausea.   Final Clinical Impressions(s) / ED Diagnoses   Final diagnoses:  None   1. Abdominal pain 2. Cholelithiasis  ED Discharge Orders    None       UCharlann Lange PA-C 02/18/18 [id number]   MMerrily Pew MD 02/19/18 [phone]

## 2018-02-18 NOTE — H&P (Signed)
CC: Consult by Charlann Lange, PA-C  HPI: Bethany Gallagher is an 27 y.o. female with hx of HTN, GERD whom has been to the hospital 5x in the last month for RUQ abdominal pain. The pain is sharp and in the RUQ; does not radiate. Reliably made worse with greasy/fatty food ingestion. Associated with n/v. Denies f/c. Pain does not radiate. She has undergone extensive workup. Her workup has been significant gallstones without signs of cholecystitis. HIDA scan was performed 11/13 and demonstrated patent cystic duct. She has had a transaminitis but normal bilirubin. Her hepatitis panel has been negative and tylenol levels normal.  She denies any prior abdominal operations  She does report hx of STDs - but many years ago; unclear if ever had PID  Past Medical History:  Diagnosis Date  . GERD (gastroesophageal reflux disease)   . Hx of chlamydia infection   . Hx of gonorrhea   . Pregnancy induced hypertension    2013   . RUQ abdominal pain 02/2018  . Vaginal Pap smear, abnormal     Past Surgical History:  Procedure Laterality Date  . NO PAST SURGERIES      Family History  Problem Relation Age of Onset  . Cancer Maternal Grandfather   . Asthma Brother   . Alcohol abuse Neg Hx   . Diabetes Neg Hx   . Heart disease Neg Hx   . Hypertension Neg Hx   . Stroke Neg Hx     Social:  reports that she has quit smoking. Her smoking use included cigarettes. She quit smokeless tobacco use about 2 years ago. She reports that she drank alcohol. She reports that she has current or past drug history. Drug: Marijuana.  Allergies: No Known Allergies  Medications: I have reviewed the patient's current medications.  Results for orders placed or performed during the hospital encounter of 02/17/18 (from the past 48 hour(s))  Urinalysis, Routine w reflex microscopic     Status: Abnormal   Collection Time: 02/17/18 11:38 PM  Result Value Ref Range   Color, Urine AMBER (A) YELLOW    Comment:  BIOCHEMICALS MAY BE AFFECTED BY COLOR   APPearance CLOUDY (A) CLEAR   Specific Gravity, Urine 1.025 1.005 - 1.030   pH 6.0 5.0 - 8.0   Glucose, UA NEGATIVE NEGATIVE mg/dL   Hgb urine dipstick NEGATIVE NEGATIVE   Bilirubin Urine NEGATIVE NEGATIVE   Ketones, ur NEGATIVE NEGATIVE mg/dL   Protein, ur NEGATIVE NEGATIVE mg/dL   Nitrite NEGATIVE NEGATIVE   Leukocytes, UA NEGATIVE NEGATIVE    Comment: Performed at [facility] [address], [place], [place] [address]  Lipase, blood     Status: None   Collection Time: 02/17/18 11:47 PM  Result Value Ref Range   Lipase 36 11 - 51 U/L    Comment: Performed at [facility], [place] [address], [place], [place] [address]  Comprehensive metabolic panel     Status: Abnormal   Collection Time: 02/17/18 11:47 PM  Result Value Ref Range   Sodium 139 135 - 145 mmol/L   Potassium 3.6 3.5 - 5.1 mmol/L   Chloride 105 98 - 111 mmol/L   CO2 23 22 - 32 mmol/L   Glucose, Bld 103 (H) 70 - 99 mg/dL   BUN 6 6 - 20 mg/dL   Creatinine, Ser 0.83 0.44 - 1.00 mg/dL   Calcium 9.8 8.9 - 10.3 mg/dL   Total Protein 7.7 6.5 - 8.1 g/dL   Albumin 4.2  3.5 - 5.0 g/dL   AST 627 (H) 15 - 41 U/L   ALT 622 (H) 0 - 44 U/L   Alkaline Phosphatase 251 (H) 38 - 126 U/L   Total Bilirubin 0.7 0.3 - 1.2 mg/dL   GFR calc non Af Amer >60 >60 mL/min   GFR calc Af Amer >60 >60 mL/min    Comment: (NOTE) The eGFR has been calculated using the CKD EPI equation. This calculation has not been validated in all clinical situations. eGFR's persistently <60 mL/min signify possible Chronic Kidney Disease.    Anion gap 11 5 - 15    Comment: Performed at [facility] [address] [place], [place] [address]  CBC     Status: Abnormal   Collection Time: 02/17/18 11:47 PM  Result Value Ref Range   WBC 4.6 4.0 - 10.5 K/uL   RBC 4.00 3.87 - 5.11 MIL/uL   Hemoglobin 10.8 (L) 12.0 - 15.0 g/dL   HCT 34.0 (L) 36.0 - 46.0 %   MCV 85.0 80.0 - 100.0 fL   MCH 27.0 26.0 - 34.0  pg   MCHC 31.8 30.0 - 36.0 g/dL   RDW 14.1 11.5 - 15.5 %   Platelets 315 150 - 400 K/uL   nRBC 0.0 0.0 - 0.2 %    Comment: Performed at [facility], [place] [address], [place], [place] [address]  Acetaminophen level     Status: Abnormal   Collection Time: 02/17/18 11:47 PM  Result Value Ref Range   Acetaminophen (Tylenol), Serum <10 (L) 10 - 30 ug/mL    Comment: (NOTE) Therapeutic concentrations vary significantly. A range of 10-30 ug/mL  may be an effective concentration for many patients. However, some  are best treated at concentrations outside of this range. Acetaminophen concentrations >150 ug/mL at 4 hours after ingestion  and >50 ug/mL at 12 hours after ingestion are often associated with  toxic reactions. Performed at [facility], [place] [address], [place], [place] [address]   I-Stat beta hCG blood, ED     Status: None   Collection Time: 02/17/18 11:59 PM  Result Value Ref Range   I-stat hCG, quantitative <5.0 <5 mIU/mL   Comment 3            Comment:   GEST. AGE      CONC.  (mIU/mL)   <=1 WEEK        5 - 50     2 WEEKS       50 - 500     3 WEEKS       100 - 10,000     4 WEEKS     1,000 - 30,000        FEMALE AND NON-PREGNANT FEMALE:     LESS THAN 5 mIU/mL     Nm Hepato W/eject Fract  Result Date: 02/16/2018 CLINICAL DATA:  Right upper quadrant pain EXAM: NUCLEAR MEDICINE HEPATOBILIARY IMAGING WITH GALLBLADDER EF TECHNIQUE: Sequential images of the abdomen were obtained out to 60 minutes following intravenous administration of radiopharmaceutical. After oral ingestion of Ensure, gallbladder ejection fraction was determined. At 60 min, normal ejection fraction is greater than 33%. RADIOPHARMACEUTICALS:  Five mCi Tc-79m Choletec IV COMPARISON:  Ultrasound from the previous day FINDINGS: Prompt uptake and biliary excretion of activity by the liver is seen. Gallbladder activity is visualized, consistent with patency of cystic duct. Biliary activity passes into small  bowel, consistent with patent common bile duct. Calculated gallbladder ejection  fraction could not be adequately performed due to the significant patient motion. Evaluation of the send a images suggests some gallbladder emptying although the gallbladder ejection fraction is likely below normal. IMPRESSION: Normal uptake and excretion of biliary tracer. Gallbladder ejection fraction is felt to be below normal although a calculated number could not be performed due to significant patient motion artifact. Electronically Signed   By: Inez Catalina M.D.   On: 02/16/2018 15:36   US Abdomen Limited  Result Date: 02/18/2018 CLINICAL DATA:  Acute onset of right upper quadrant abdominal pain. EXAM: ULTRASOUND ABDOMEN LIMITED RIGHT UPPER QUADRANT COMPARISON:  Right upper quadrant ultrasound performed 02/15/2018 FINDINGS: Gallbladder: Numerous stones are seen within the gallbladder. The gallbladder wall is borderline normal in thickness. No ultrasonographic Murphy's sign is elicited. No pericholecystic fluid is identified. Common bile duct: Diameter: 0.5 cm, within normal limits in caliber. Liver: No focal lesion identified. Within normal limits in parenchymal echogenicity. Portal vein is patent on color Doppler imaging with normal direction of blood flow towards the liver. IMPRESSION: Cholelithiasis; gallbladder otherwise grossly unremarkable. No acute abnormality of the right upper quadrant. Electronically Signed   By: Garald Balding M.D.   On: 02/18/2018 04:15    ROS - all of the below systems have been reviewed with the patient and positives are indicated with bold text General: chills, fever or night sweats Eyes: blurry vision or double vision ENT: epistaxis or sore throat Allergy/Immunology: itchy/watery eyes or nasal congestion Hematologic/Lymphatic: bleeding problems, blood clots or swollen lymph nodes Endocrine: temperature intolerance or unexpected weight changes Breast: new or changing breast lumps or  nipple discharge Resp: cough, shortness of breath, or wheezing CV: chest pain or dyspnea on exertion GI: as per HPI GU: dysuria, trouble voiding, or hematuria MSK: joint pain or joint stiffness Neuro: TIA or stroke symptoms Derm: pruritus and skin lesion changes Psych: anxiety and depression  PE Blood pressure 100/68, pulse 74, temperature 98.2 F (36.8 C), temperature source Oral, resp. rate 13, height _0  (1.651 m), weight 56.7 kg, last menstrual period 02/02/2018, SpO2 98 %, not currently breastfeeding. Constitutional: NAD; conversant; no deformities Eyes: Moist conjunctiva; no lid lag; anicteric; PERRL Neck: Trachea midline; no thyromegaly Lungs: Normal respiratory effort; no tactile fremitus CV: RRR; no palpable thrills; no pitting edema GI: Abd soft, ttp along RUQ; negative Murphy's; no palpable hepatosplenomegaly MSK: Normal gait; no clubbing/cyanosis Psychiatric: Appropriate affect; alert and oriented x3 Lymphatic: No palpable cervical or axillary lymphadenopathy  Results for orders placed or performed during the hospital encounter of 02/17/18 (from the past 48 hour(s))  Urinalysis, Routine w reflex microscopic     Status: Abnormal   Collection Time: 02/17/18 11:38 PM  Result Value Ref Range   Color, Urine AMBER (A) YELLOW    Comment: BIOCHEMICALS MAY BE AFFECTED BY COLOR   APPearance CLOUDY (A) CLEAR   Specific Gravity, Urine 1.025 1.005 - 1.030   pH 6.0 5.0 - 8.0   Glucose, UA NEGATIVE NEGATIVE mg/dL   Hgb urine dipstick NEGATIVE NEGATIVE   Bilirubin Urine NEGATIVE NEGATIVE   Ketones, ur NEGATIVE NEGATIVE mg/dL   Protein, ur NEGATIVE NEGATIVE mg/dL   Nitrite NEGATIVE NEGATIVE   Leukocytes, UA NEGATIVE NEGATIVE    Comment: Performed at [facility], [address] [place], [place] [address]  Lipase, blood     Status: None   Collection Time: 02/17/18 11:47 PM  Result Value Ref Range   Lipase 36 11 - 51 U/L    Comment: Performed at  [facility],  [place] [address] [place], [place] [address]  Comprehensive metabolic panel     Status: Abnormal   Collection Time: 02/17/18 11:47 PM  Result Value Ref Range   Sodium 139 135 - 145 mmol/L   Potassium 3.6 3.5 - 5.1 mmol/L   Chloride 105 98 - 111 mmol/L   CO2 23 22 - 32 mmol/L   Glucose, Bld 103 (H) 70 - 99 mg/dL   BUN 6 6 - 20 mg/dL   Creatinine, Ser 0.83 0.44 - 1.00 mg/dL   Calcium 9.8 8.9 - 10.3 mg/dL   Total Protein 7.7 6.5 - 8.1 g/dL   Albumin 4.2 3.5 - 5.0 g/dL   AST 627 (H) 15 - 41 U/L   ALT 622 (H) 0 - 44 U/L   Alkaline Phosphatase 251 (H) 38 - 126 U/L   Total Bilirubin 0.7 0.3 - 1.2 mg/dL   GFR calc non Af Amer >60 >60 mL/min   GFR calc Af Amer >60 >60 mL/min    Comment: (NOTE) The eGFR has been calculated using the CKD EPI equation. This calculation has not been validated in all clinical situations. eGFR's persistently <60 mL/min signify possible Chronic Kidney Disease.    Anion gap 11 5 - 15    Comment: Performed at [facility] [address] [place], [place] [address]  CBC     Status: Abnormal   Collection Time: 02/17/18 11:47 PM  Result Value Ref Range   WBC 4.6 4.0 - 10.5 K/uL   RBC 4.00 3.87 - 5.11 MIL/uL   Hemoglobin 10.8 (L) 12.0 - 15.0 g/dL   HCT 34.0 (L) 36.0 - 46.0 %   MCV 85.0 80.0 - 100.0 fL   MCH 27.0 26.0 - 34.0 pg   MCHC 31.8 30.0 - 36.0 g/dL   RDW 14.1 11.5 - 15.5 %   Platelets 315 150 - 400 K/uL   nRBC 0.0 0.0 - 0.2 %    Comment: Performed at [facility], [place] [address] [place], [place] [address]  Acetaminophen level     Status: Abnormal   Collection Time: 02/17/18 11:47 PM  Result Value Ref Range   Acetaminophen (Tylenol), Serum <10 (L) 10 - 30 ug/mL    Comment: (NOTE) Therapeutic concentrations vary significantly. A range of 10-30 ug/mL  may be an effective concentration for many patients. However, some  are best treated at concentrations outside of this range. Acetaminophen concentrations >150 ug/mL at 4 hours after ingestion    and >50 ug/mL at 12 hours after ingestion are often associated with  toxic reactions. Performed at [facility], [place] [address], [place], [place] [address]   I-Stat beta hCG blood, ED     Status: None   Collection Time: 02/17/18 11:59 PM  Result Value Ref Range   I-stat hCG, quantitative <5.0 <5 mIU/mL   Comment 3            Comment:   GEST. AGE      CONC.  (mIU/mL)   <=1 WEEK        5 - 50     2 WEEKS       50 - 500     3 WEEKS       100 - 10,000     4 WEEKS     1,000 - 30,000        FEMALE AND NON-PREGNANT FEMALE:     LESS THAN 5 mIU/mL  Nm Hepato W/eject Fract  Result Date: 02/16/2018 CLINICAL DATA:  Right upper quadrant pain EXAM: NUCLEAR MEDICINE HEPATOBILIARY IMAGING WITH GALLBLADDER EF TECHNIQUE: Sequential images of the abdomen were obtained out to 60 minutes following intravenous administration of radiopharmaceutical. After oral ingestion of Ensure, gallbladder ejection fraction was determined. At 60 min, normal ejection fraction is greater than 33%. RADIOPHARMACEUTICALS:  Five mCi Tc-1m Choletec IV COMPARISON:  Ultrasound from the previous day FINDINGS: Prompt uptake and biliary excretion of activity by the liver is seen. Gallbladder activity is visualized, consistent with patency of cystic duct. Biliary activity passes into small bowel, consistent with patent common bile duct. Calculated gallbladder ejection fraction could not be adequately performed due to the significant patient motion. Evaluation of the send a images suggests some gallbladder emptying although the gallbladder ejection fraction is likely below normal. IMPRESSION: Normal uptake and excretion of biliary tracer. Gallbladder ejection fraction is felt to be below normal although a calculated number could not be performed due to significant patient motion artifact. Electronically Signed   By: MInez CatalinaM.D.   On: 02/16/2018 15:36   UKoreaAbdomen Limited  Result Date: 02/18/2018 CLINICAL DATA:  Acute  onset of right upper quadrant abdominal pain. EXAM: ULTRASOUND ABDOMEN LIMITED RIGHT UPPER QUADRANT COMPARISON:  Right upper quadrant ultrasound performed 02/15/2018 FINDINGS: Gallbladder: Numerous stones are seen within the gallbladder. The gallbladder wall is borderline normal in thickness. No ultrasonographic Murphy's sign is elicited. No pericholecystic fluid is identified. Common bile duct: Diameter: 0.5 cm, within normal limits in caliber. Liver: No focal lesion identified. Within normal limits in parenchymal echogenicity. Portal vein is patent on color Doppler imaging with normal direction of blood flow towards the liver. IMPRESSION: Cholelithiasis; gallbladder otherwise grossly unremarkable. No acute abnormality of the right upper quadrant. Electronically Signed   By: JGarald BaldingM.D.   On: 02/18/2018 04:15   A/P: RLASHAN MACIASis an 27y.o. female with most likely symptomatic cholelithiasis - resulting in 5 presentations to the ED for this pain now since 02/11/18; could have underlying cholecystits at this point although HIDA negative 2d ago.  -Will plan to admit to hopsital -NPO, MIVF -The anatomy and physiology of the hepatobiliary system was discussed at length with the patient with associated pictures. The pathophysiology of gallbladder disease was discussed at length as well. -The options for treatment were discussed including ongoing observation which may result in subsequent gallbladder complications (infection, pancreatitis, choledocholithiasis, etc). -The planned procedure, material risks (including, but not limited to, pain, bleeding, infection, scarring, need for blood transfusion, damage to surrounding structures- blood vessels/nerves/viscus/organs, damage to bile duct, bile leak, need for additional procedures, hernia, worsening of pre-existing medical conditions, pancreatitis, pneumonia, heart attack, stroke, death) benefits and alternatives to surgery were discussed at length.  I noted a good probability that the procedure would help improve their symptoms. The patient's questions were answered to her satisfaction, she voiced understanding and they elected to proceed with surgery. Additionally, we discussed typical postoperative expectations and the recovery process. We discussed the potential for incomplete resolution of her symptoms as well, although at this point in time, the leading diagnosis is symptomatic cholelithiasis. -I discussed that this case would be performed by one of my partners whom will be by to see her as well  CSharon Mt WDema Severin M.D. [facility]

## 2018-02-18 NOTE — Anesthesia Preprocedure Evaluation (Addendum)
Anesthesia Evaluation  Patient identified by MRN, date of birth, ID band Patient awake    Reviewed: Allergy & Precautions, NPO status , Patient's Chart, lab work & pertinent test results  History of Anesthesia Complications Negative for: history of anesthetic complications  Airway Mallampati: I  TM Distance: >3 FB Neck ROM: Full    Dental  (+) Chipped, Teeth Intact, Dental Advisory Given,    Pulmonary neg pulmonary ROS, former smoker,    Pulmonary exam normal        Cardiovascular Normal cardiovascular exam     Neuro/Psych negative neurological ROS     GI/Hepatic GERD  Controlled and Medicated,  Endo/Other    Renal/GU      Musculoskeletal   Abdominal   Peds  Hematology   Anesthesia Other Findings   Reproductive/Obstetrics                          Anesthesia Physical Anesthesia Plan  ASA: II  Anesthesia Plan: General   Post-op Pain Management:    Induction: Intravenous  PONV Risk Score and Plan: 4 or greater and Ondansetron, Dexamethasone, Midazolam and Treatment may vary due to age or medical condition  Airway Management Planned: Oral ETT  Additional Equipment:   Intra-op Plan:   Post-operative Plan: Extubation in OR  Informed Consent: I have reviewed the patients History and Physical, chart, labs and discussed the procedure including the risks, benefits and alternatives for the proposed anesthesia with the patient or authorized representative who has indicated his/her understanding and acceptance.   Dental advisory given  Plan Discussed with: CRNA and Surgeon  Anesthesia Plan Comments:        Anesthesia Quick Evaluation

## 2018-02-18 NOTE — ED Provider Notes (Signed)
Medical screening examination/treatment/procedure(s) were conducted as a shared visit with non-physician practitioner(s) and myself.  I personally evaluated the patient during the encounter.  27 year old female with worsening transaminitis over the last 10 days.  Hepatitis negative mono negative had a borderline HIDA scan and recent admission and just discharged recently.  She ate a fatty meal and had recurrence of symptoms.  My evaluation after pain medication she still having significant tenderness to palpation but no rebound, guarding or other evidence of peritonitis.  Ultrasound with cholelithiasis but no acute cholecystitis.  Plan for consultation with surgery for further recommendations.   None     Chidinma Clites, Barbara CowerJason, MD 02/19/18 [phone]

## 2018-02-18 NOTE — ED Notes (Signed)
Patient transported to Ultrasound 

## 2018-02-18 NOTE — Transfer of Care (Signed)
Immediate Anesthesia Transfer of Care Note  Patient: Bethany Gallagher  Procedure(s) Performed: LAPAROSCOPIC CHOLECYSTECTOMY (N/A )  Patient Location: PACU  Anesthesia Type:General  Level of Consciousness: sedated  Airway & Oxygen Therapy: Patient Spontanous Breathing and Patient connected to nasal cannula oxygen  Post-op Assessment: Report given to RN, Post -op Vital signs reviewed and stable and Patient moving all extremities  Post vital signs: Reviewed and stable  Last Vitals:  Vitals Value Taken Time  BP 128/97 02/18/2018 11:19 AM  Temp    Pulse 88 02/18/2018 11:22 AM  Resp 14 02/18/2018 11:22 AM  SpO2 95 % 02/18/2018 11:22 AM  Vitals shown include unvalidated device data.  Last Pain:  Vitals:   02/18/18 0700  TempSrc: Oral  PainSc:          Complications: No apparent anesthesia complications

## 2018-02-18 NOTE — Anesthesia Procedure Notes (Signed)
Procedure Name: Intubation Date/Time: 02/18/2018 10:10 AM Performed by: Leonor Liv, CRNA Pre-anesthesia Checklist: Patient identified, Emergency Drugs available, Suction available and Patient being monitored Patient Re-evaluated:Patient Re-evaluated prior to induction Oxygen Delivery Method: Circle System Utilized Preoxygenation: Pre-oxygenation with 100% oxygen Induction Type: IV induction Ventilation: Mask ventilation without difficulty Laryngoscope Size: Mac and 3 Grade View: Grade I Tube type: Oral Tube size: 7.0 mm Number of attempts: 1 Airway Equipment and Method: Stylet and Oral airway Placement Confirmation: ETT inserted through vocal cords under direct vision,  positive ETCO2 and breath sounds checked- equal and bilateral Secured at: 21 cm Tube secured with: Tape Dental Injury: Teeth and Oropharynx as per pre-operative assessment

## 2018-02-18 NOTE — Op Note (Signed)
Operative Note  Bethany Gallagher 27 y.o. female [id number]  02/18/2018  Surgeon: Clovis Riley MD  Assistant: none  Procedure performed: Laparoscopic Cholecystectomy  Preop diagnosis: biliary colic, elevated alk phos, AST and ALT but normal bilirubin Post-op diagnosis/intraop findings: same  Specimens: gallbladder  EBL: minimal  Complications: none  Description of procedure: After obtaining informed consent the patient was brought to the operating room. Prophylactic antibiotics were administered. SCD's were applied. General endotracheal anesthesia was initiated and a formal time-out was performed. The abdomen was prepped and draped in the usual sterile fashion and the abdomen was entered using an infraumbilical veress needle after instilling the site with local. Insufflation to 1mHg was obtained, 573mtrocar and camera inserted and gross inspection revealed no evidence of injury from our entry or other intraabdominal abnormalities. Two 76m52mrocars were introduced in the right midclavicular and right anterior axillary lines under direct visualization and following infiltration with local. An 67m66mocar was placed in the epigastrium. the abdomen was surveyed, there are no gross intra-abdominal abnormalities, the liver has a normal appearance with no visible masses or adhesive disease. The gallbladder was retracted cephalad and the infundibulum was retracted laterally. The cystic duct seemed to be coiled on itself and tethered by the overlying peritoneum.  A combination of hook electrocautery and blunt dissection was utilized to clear the peritoneum from the neck and cystic duct, circumferentially isolating the cystic artery and cystic duct and lifting the gallbladder from the cystic plate. This was done very carefully, the common bile duct was visible throughout the case and was preserved free of injury.The critical view of safety was achieved with the cystic artery, cystic duct, and liver  bed visualized between them with no other structures. The gallbladder was dissected further off the liver bed and the tethering peritoneum along the kinked cystic duct was carefully dissected with cautery until I could be straightened out and the anatomy was crystal clear. A clip was placed on the distal cystic duct and a ductotomy made. I attempted to insert the cholangiogram catheter in order to perform a cholangiogram given her abnormal liver function tests, but I could not get the catheter to pass far enough into the cystic duct to secure it with a clip. The cholangiogram was aborted. Her LFTs will be rechecked tomorrow. The artery was clipped with a single clip proximally and distally and divided as was the cystic duct with three clips on the proximal end. Additionally a PDS Endoloop was placed proximal to the clips as the duct was somewhat patulous. The gallbladder was dissected from the liver plate using electrocautery. Once freed the gallbladder was placed in an endocatch bag and removed intact through the epigastric trocar site. The right upper quadrant was irrigated and aspirated and the effluent was clear. Hemostasis was once again confirmed, and reinspection of the abdomen revealed no injuries. The clips were well opposed without any bile leak from the duct or the liver bed. The 67mm30mcar site in the epigastrium was closed with a 0 vicryl in the fascia under direct visualization using a PMI device. The abdomen was desufflated and all trocars removed. The skin incisions were closed with running subcuticular monocryl and Dermabond. The patient was awakened, extubated and transported to the recovery room in stable condition.   All counts were correct at the completion of the case.

## 2018-02-18 NOTE — ED Notes (Signed)
Attempted report 

## 2018-02-18 NOTE — Discharge Instructions (Addendum)
LAPAROSCOPIC SURGERY: POST OP INSTRUCTIONS ° °###################################################################### ° °EAT °Gradually transition to a high fiber diet with a fiber supplement over the next few weeks after discharge.  Start with a pureed / full liquid diet (see below) ° °WALK °Walk an hour a day.  Control your pain to do that.   ° °CONTROL PAIN °Control pain so that you can walk, sleep, tolerate sneezing/coughing, go up/down stairs. ° °HAVE A BOWEL MOVEMENT DAILY °Keep your bowels regular to avoid problems.  OK to try a laxative to override constipation.  OK to use an antidairrheal to slow down diarrhea.  Call if not better after 2 tries ° °CALL IF YOU HAVE PROBLEMS/CONCERNS °Call if you are still struggling despite following these instructions. °Call if you have concerns not answered by these instructions ° °###################################################################### ° ° ° °1. DIET: Follow a light bland diet the first 24 hours after arrival home, such as soup, liquids, crackers, etc.  Be sure to include lots of fluids daily.  Avoid fast food or heavy meals as your are more likely to get nauseated.  Eat a low fat the next few days after surgery.   ° °2. Take your usually prescribed home medications unless otherwise directed. ° °3. PAIN CONTROL: °a. Pain is best controlled by a usual combination of three different methods TOGETHER: °i. Ice/Heat °ii. Over the counter pain medication °iii. Prescription pain medication °b. Most patients will experience some swelling and bruising around the incisions.  Ice packs or heating pads (30-60 minutes up to 6 times a day) will help. Use ice for the first few days to help decrease swelling and bruising, then switch to heat to help relax tight/sore spots and speed recovery.  Some people prefer to use ice alone, heat alone, alternating between ice & heat.  Experiment to what works for you.  Swelling and bruising can take several weeks to resolve.   °c. It  is helpful to take an over-the-counter pain medication regularly for the first few weeks.  Choose one of the following that works best for you: °i. Naproxen (Aleve, etc)  Two 220mg tabs twice a day °ii. Ibuprofen (Advil, etc) Three 200mg tabs four times a day (every meal & bedtime) °iii. Acetaminophen (Tylenol, etc) 500-650mg four times a day (every meal & bedtime) °d. A  prescription for pain medication (such as oxycodone, hydrocodone, tramadol, gabapentin, methocarbamol, etc) should be given to you upon discharge.  Take your pain medication as prescribed.  °i. If you are having problems/concerns with the prescription medicine (does not control pain, nausea, vomiting, rash, itching, etc), please call us [phone] to see if we need to switch you to a different pain medicine that will work better for you and/or control your side effect better. °ii. If you need a refill on your pain medication, please give us 48 hour notice.  contact your pharmacy.  They will contact our office to request authorization. Prescriptions will not be filled after 5 pm or on week-ends ° °4. Avoid getting constipated.   °a. Between the surgery and the pain medications, it is common to experience some constipation.   °b. Increasing fluid intake and taking a fiber supplement (such as Metamucil, Citrucel, FiberCon, MiraLax, etc) 1-2 times a day regularly will usually help prevent this problem from occurring.   °c. A mild laxative (prune juice, Milk of Magnesia, MiraLax, etc) should be taken according to package directions if there are no bowel movements after 48 hours.   °5. Watch out for   diarrhea.   °a. If you have many loose bowel movements, simplify your diet to bland foods & liquids for a few days.   °b. Stop any stool softeners and decrease your fiber supplement.   °c. Switching to mild anti-diarrheal medications (Kayopectate, Pepto Bismol) can help.   °d. If this worsens or does not improve, please call us. ° °6. Wash / shower every  day.  You may shower over the dressings as they are waterproof.  Continue to shower over incision(s) after the dressing is off. ° °7. Remove your waterproof bandages 5 days after surgery.  You may leave the incision open to air.  You may replace a dressing/Band-Aid to cover the incision for comfort if you wish.  ° °8. ACTIVITIES as tolerated:   °a. You may resume regular (light) daily activities beginning the next day--such as daily self-care, walking, climbing stairs--gradually increasing activities as tolerated.  If you can walk 30 minutes without difficulty, it is safe to try more intense activity such as jogging, treadmill, bicycling, low-impact aerobics, swimming, etc. °b. Save the most intensive and strenuous activity for last such as sit-ups, heavy lifting, contact sports, etc  Refrain from any heavy lifting or straining until you are off narcotics for pain control.   °c. DO NOT PUSH THROUGH PAIN.  Let pain be your guide: If it hurts to do something, don't do it.  Pain is your body warning you to avoid that activity for another week until the pain goes down. °d. You may drive when you are no longer taking prescription pain medication, you can comfortably wear a seatbelt, and you can safely maneuver your car and apply brakes. °e. You may have sexual intercourse when it is comfortable. ° °9. FOLLOW UP in our office °a. Please call [facility] at [phone] to set up an appointment to see your surgeon in the office for a follow-up appointment approximately 2-3 weeks after your surgery. °b. Make sure that you call for this appointment the day you arrive home to insure a convenient appointment time. ° °10. IF YOU HAVE DISABILITY OR FAMILY LEAVE FORMS, BRING THEM TO THE OFFICE FOR PROCESSING.  DO NOT GIVE THEM TO YOUR DOCTOR. ° ° °WHEN TO CALL US [phone]: °1. Poor pain control °2. Reactions / problems with new medications (rash/itching, nausea, etc)  °3. Fever over 101.5 F (38.5 C) °4. Inability to  urinate °5. Nausea and/or vomiting °6. Worsening swelling or bruising °7. Continued bleeding from incision. °8. Increased pain, redness, or drainage from the incision ° ° The clinic staff is available to answer your questions during regular business hours (8:30am-5pm).  Please don’t hesitate to call and ask to speak to one of our nurses for clinical concerns.  ° If you have a medical emergency, go to the nearest emergency room or call 911. ° A surgeon from [facility] is always on call at the hospitals ° ° °[facility] °[address], [address], [place], [place]  [address] ? °MAIN: [phone] ? TOLL FREE: [phone] ?  °FAX [phone] °[email or web address] ° ° ° ° °Cholecystitis °Cholecystitis is inflammation of the gallbladder. It is often called a gallbladder attack. The gallbladder is a pear-shaped organ that lies beneath the liver on the right side of the body. The gallbladder stores bile, which is a fluid that helps the body to digest fats. If bile builds up in your gallbladder, your gallbladder becomes inflamed. This condition may occur suddenly (be acute). Repeat   episodes of acute cholecystitis or prolonged episodes may lead to a long-term (chronic) condition. Cholecystitis is serious and it requires treatment. °What are the causes? °The most common cause of this condition is gallstones. Gallstones can block the tube (duct) that carries bile out of your gallbladder. This causes bile to build up. Other causes of this condition include: °· Damage to the gallbladder due to a decrease in blood flow. °· Infections in the bile ducts. °· Scars or kinks in the bile ducts. °· Tumors in the liver, pancreas, or gallbladder. ° °What increases the risk? °This condition is more likely to develop in: °· People who have sickle cell disease. °· People who take birth control pills or use estrogen. °· People who have alcoholic liver disease. °· People who have liver  cirrhosis. °· People who have their nutrition delivered through a vein (parenteral nutrition). °· People who do not eat or drink (do fasting) for a long period of time. °· People who are obese. °· People who have rapid weight loss. °· People who are pregnant. °· People who have increased triglyceride levels. °· People who have pancreatitis. ° °What are the signs or symptoms? °Symptoms of this condition include: °· Abdominal pain, especially in the upper right area of the abdomen. °· Abdominal tenderness or bloating. °· Nausea. °· Vomiting. °· Fever. °· Chills. °· Yellowing of the skin and the whites of the eyes (jaundice). ° °How is this diagnosed? °This condition is diagnosed with a medical history and physical exam. You may also have other tests, including: °· Imaging tests, such as: °? An ultrasound of the gallbladder. °? A CT scan of the abdomen. °? A gallbladder nuclear scan (HIDA scan). This scan allows your health care provider to see the bile moving from your liver to your gallbladder and to your small intestine. °? MRI. °· Blood tests, such as: °? A complete blood count, because the white blood cell count may be higher than normal. °? Liver function tests, because some levels may be higher than normal with certain types of gallstones. ° °How is this treated? °Treatment may include: °· Fasting for a certain amount of time. °· IV fluids. °· Medicine to treat pain or vomiting. °· Antibiotic medicine. °· Surgery to remove your gallbladder (cholecystectomy). This may happen immediately or at a later time. ° °Follow these instructions at home: °Home care will depend on your treatment. In general: °· Take over-the-counter and prescription medicines only as told by your health care provider. °· If you were prescribed an antibiotic medicine, take it as told by your health care provider. Do not stop taking the antibiotic even if you start to feel better. °· Follow instructions from your health care provider about  what to eat or drink. When you are allowed to eat, avoid eating or drinking anything that triggers your symptoms. °· Keep all follow-up visits as told by your health care provider. This is important. ° °Contact a health care provider if: °· Your pain is not controlled with medicine. °· You have a fever. °Get help right away if: °· Your pain moves to another part of your abdomen or to your back. °· You continue to have symptoms or you develop new symptoms even with treatment. °This information is not intended to replace advice given to you by your health care provider. Make sure you discuss any questions you have with your health care provider. °Document Released: 03/23/2005 Document Revised: 08/01/2015 Document Reviewed: 07/04/2014 °[employer] Interactive Patient Education © 2018   [employer] ° °

## 2018-02-19 ENCOUNTER — Encounter (HOSPITAL_COMMUNITY): Payer: Self-pay | Admitting: Surgery

## 2018-02-19 DIAGNOSIS — Z8619 Personal history of other infectious and parasitic diseases: Secondary | ICD-10-CM | POA: Diagnosis present

## 2018-02-19 DIAGNOSIS — K219 Gastro-esophageal reflux disease without esophagitis: Secondary | ICD-10-CM | POA: Diagnosis present

## 2018-02-19 LAB — COMPREHENSIVE METABOLIC PANEL
ALT: 605 U/L — ABNORMAL HIGH (ref 0–44)
AST: 569 U/L — ABNORMAL HIGH (ref 15–41)
Albumin: 3.4 g/dL — ABNORMAL LOW (ref 3.5–5.0)
Alkaline Phosphatase: 205 U/L — ABNORMAL HIGH (ref 38–126)
Anion gap: 7 (ref 5–15)
BUN: <5 mg/dL — ABNORMAL LOW (ref 6–20)
CO2: 26 mmol/L (ref 22–32)
Calcium: 9 mg/dL (ref 8.9–10.3)
Chloride: 104 mmol/L (ref 98–111)
Creatinine, Ser: 0.9 mg/dL (ref 0.44–1.00)
GFR calc Af Amer: >60 mL/min (ref >60)
GFR calc non Af Amer: >60 mL/min (ref >60)
Glucose, Bld: 100 mg/dL — ABNORMAL HIGH (ref 70–99)
Potassium: 3.6 mmol/L (ref 3.5–5.1)
Sodium: 137 mmol/L (ref 135–145)
Total Bilirubin: 0.9 mg/dL (ref 0.3–1.2)
Total Protein: 6.4 g/dL — ABNORMAL LOW (ref 6.5–8.1)

## 2018-02-19 LAB — CBC
HCT: 32.1 % — ABNORMAL LOW (ref 36.0–46.0)
Hemoglobin: 9.8 g/dL — ABNORMAL LOW (ref 12.0–15.0)
MCH: 26.1 pg (ref 26.0–34.0)
MCHC: 30.5 g/dL (ref 30.0–36.0)
MCV: 85.6 fL (ref 80.0–100.0)
Platelets: 315 10*3/uL (ref 150–400)
RBC: 3.75 MIL/uL — ABNORMAL LOW (ref 3.87–5.11)
RDW: 14.2 % (ref 11.5–15.5)
WBC: 5.4 10*3/uL (ref 4.0–10.5)
nRBC: 0 % (ref 0.0–0.2)

## 2018-02-19 MED ORDER — SODIUM CHLORIDE 0.9 % IV SOLN: 250.0000 mL | INTRAVENOUS | Status: DC | PRN

## 2018-02-19 MED ORDER — PSYLLIUM 95 % PO PACK
1.0000 | PACK | Freq: Every day | ORAL | Status: DC
  Administered 2018-02-19: 1 via ORAL
  Filled 2018-02-19 (×3): qty 1

## 2018-02-19 MED ORDER — OXYCODONE HCL 5 MG PO TABS
5.0000 mg | ORAL_TABLET | ORAL | Status: DC | PRN
  Administered 2018-02-19 – 2018-02-21 (×3): 10 mg via ORAL
  Filled 2018-02-19 (×4): qty 2

## 2018-02-19 MED ORDER — LIP MEDEX EX OINT
1.0000 "application " | TOPICAL_OINTMENT | Freq: Two times a day (BID) | CUTANEOUS | Status: DC
  Administered 2018-02-21: 1 via TOPICAL
  Filled 2018-02-19 (×2): qty 7

## 2018-02-19 MED ORDER — HYDROMORPHONE HCL 1 MG/ML IJ SOLN
INTRAMUSCULAR | Status: AC
  Filled 2018-02-19: qty 1

## 2018-02-19 MED ORDER — SODIUM CHLORIDE 0.9 % IV BOLUS: 1000.0000 mL | Freq: Three times a day (TID) | INTRAVENOUS | Status: DC | PRN

## 2018-02-19 MED ORDER — SODIUM CHLORIDE 0.9% FLUSH: 3.0000 mL | INTRAVENOUS | Status: DC | PRN

## 2018-02-19 MED ORDER — METHOCARBAMOL 500 MG PO TABS
1000.0000 mg | ORAL_TABLET | Freq: Four times a day (QID) | ORAL | Status: DC | PRN
  Filled 2018-02-19: qty 2

## 2018-02-19 MED ORDER — HYDROCORTISONE 2.5 % RE CREA
1.0000 "application " | TOPICAL_CREAM | Freq: Four times a day (QID) | RECTAL | Status: DC | PRN
  Filled 2018-02-19 (×2): qty 28.35

## 2018-02-19 MED ORDER — LACTATED RINGERS IV BOLUS
1000.0000 mL | Freq: Once | INTRAVENOUS | Status: AC
  Administered 2018-02-19: 1000 mL via INTRAVENOUS

## 2018-02-19 MED ORDER — MAGIC MOUTHWASH: 15.0000 mL | Freq: Four times a day (QID) | ORAL | Status: DC | PRN

## 2018-02-19 MED ORDER — PHENOL 1.4 % MT LIQD: 1.0000 | OROMUCOSAL | Status: DC | PRN

## 2018-02-19 MED ORDER — NAPROXEN 250 MG PO TABS
500.0000 mg | ORAL_TABLET | Freq: Two times a day (BID) | ORAL | Status: DC
  Administered 2018-02-19 – 2018-02-20 (×2): 500 mg via ORAL
  Filled 2018-02-19 (×2): qty 2

## 2018-02-19 MED ORDER — GABAPENTIN 100 MG PO CAPS
200.0000 mg | ORAL_CAPSULE | Freq: Three times a day (TID) | ORAL | Status: DC
  Administered 2018-02-19 – 2018-02-21 (×5): 200 mg via ORAL
  Filled 2018-02-19 (×7): qty 2

## 2018-02-19 MED ORDER — HYDROCORTISONE 1 % EX CREA
1.0000 "application " | TOPICAL_CREAM | Freq: Three times a day (TID) | CUTANEOUS | Status: DC | PRN
  Filled 2018-02-19: qty 28

## 2018-02-19 MED ORDER — HYDROMORPHONE HCL 1 MG/ML IJ SOLN
0.5000 mg | INTRAMUSCULAR | Status: DC | PRN
  Administered 2018-02-19: 0.5 mg via INTRAVENOUS

## 2018-02-19 MED ORDER — ALUM & MAG HYDROXIDE-SIMETH 200-200-20 MG/5ML PO SUSP: 30.0000 mL | Freq: Four times a day (QID) | ORAL | Status: DC | PRN

## 2018-02-19 MED ORDER — PROCHLORPERAZINE EDISYLATE 10 MG/2ML IJ SOLN
5.0000 mg | INTRAMUSCULAR | Status: DC | PRN
  Administered 2018-02-21: 10 mg via INTRAVENOUS
  Filled 2018-02-19: qty 2

## 2018-02-19 MED ORDER — GUAIFENESIN-DM 100-10 MG/5ML PO SYRP: 10.0000 mL | ORAL_SOLUTION | ORAL | Status: DC | PRN

## 2018-02-19 MED ORDER — SODIUM CHLORIDE 0.9% FLUSH
3.0000 mL | Freq: Two times a day (BID) | INTRAVENOUS | Status: DC
  Administered 2018-02-20 – 2018-02-21 (×2): 3 mL via INTRAVENOUS

## 2018-02-19 MED ORDER — MENTHOL 3 MG MT LOZG: 1.0000 | LOZENGE | OROMUCOSAL | Status: DC | PRN

## 2018-02-19 MED ORDER — BISACODYL 10 MG RE SUPP: 10.0000 mg | Freq: Two times a day (BID) | RECTAL | Status: DC | PRN

## 2018-02-19 MED ORDER — METOCLOPRAMIDE HCL 5 MG/ML IJ SOLN: 5.0000 mg | Freq: Three times a day (TID) | INTRAMUSCULAR | Status: DC | PRN

## 2018-02-19 MED ORDER — ENSURE SURGERY PO LIQD
237.0000 mL | Freq: Two times a day (BID) | ORAL | Status: DC
  Filled 2018-02-19 (×6): qty 237

## 2018-02-19 MED ORDER — METHOCARBAMOL 1000 MG/10ML IJ SOLN
1000.0000 mg | Freq: Four times a day (QID) | INTRAVENOUS | Status: DC | PRN
  Filled 2018-02-19: qty 10

## 2018-02-19 NOTE — Progress Notes (Signed)
Bethany Gallagher [id number] 02/26/1991  CARE TEAM:  PCP: No primary care provider on file.  Outpatient Care Team: No care team member to display  Inpatient Treatment Team: Treatment Team: Attending Provider: Edison Pace, Md, MD; Consulting Physician: Edison Pace, Md, MD; Technician: Gasper Sells, NT; Registered Nurse: Wayna Chalet, RN; Registered Nurse: Dagoberto Ligas, RN   Problem List:   Principal Problem:   Acute with chronic cholecystitis s/p lap cholecystectomy 02/18/2018 Active Problems:   Transaminitis   Hx of chlamydia infection   GERD (gastroesophageal reflux disease)   1 Day Post-Op  02/18/2018  Procedure(s): LAPAROSCOPIC CHOLECYSTECTOMY    Assessment  Fair  [facility] Stay = 1 days)  Plan:  -Go back to to a dysphagia 1/full liquid diet.  Advance to soft diet as tolerated.  IV fluid bolus.  More aggressive nonnarcotic pain control.  Nausea control.  -VTE prophylaxis- SCDs, etc  -mobilize as tolerated to help recovery  D/C patient from hospital when patient meets criteria (anticipate in 0-1 day(s)):  Tolerating oral intake well Ambulating well Adequate pain control without IV medications Urinating  Having flatus Disposition planning in place   20 minutes spent in review, evaluation, examination, counseling, and coordination of care.  More than 50% of that time was spent in counseling.  02/19/2018    Subjective: (Chief complaint)  Vomited after eating bacon last night.  Feeling rather sore.  Was able to walk through hallways but tired.  Somewhat nauseated but tolerated a few sips of liquids  Objective:  Vital signs:  Vitals:   02/18/18 1236 02/18/18 2156 02/19/18 0247 02/19/18 0615  BP: 130/89 121/74 110/71 (!) 126/96  Pulse: 73 (!) 51 63 68  Resp: _0 Temp: (!) 97.5 F (36.4 C) 98.5 F (36.9 C) 98.6 F (37 C) 98.2 F (36.8 C)  TempSrc: Oral Oral Oral Oral  SpO2: 100% 100% 100% 98%  Weight:      Height:         Last BM Date: 02/17/18  Intake/Output   Yesterday:  11/15 0701 - 11/16 0700 In: 1410 [P.O.:360; I.V.:1050] Out: 11 [Emesis/NG output:1; Blood:10] This shift:  No intake/output data recorded.  Bowel function:  Flatus: YES  BM:  No  Drain: (No drain)   Physical Exam:  General: Pt awake/alert/oriented x4 in mild acute distress Eyes: PERRL, normal EOM.  Sclera clear.  No icterus Neuro: CN II-XII intact w/o focal sensory/motor deficits. Lymph: No head/neck/groin lymphadenopathy Psych:  No delerium/psychosis/paranoia HENT: Normocephalic, Mucus membranes moist.  No thrush Neck: Supple, No tracheal deviation Chest: No chest wall pain w good excursion CV:  Pulses intact.  Regular rhythm MS: Normal AROM mjr joints.  No obvious deformity  Abdomen: Soft.  Nondistended.  Mildly tender at incisions only.  No evidence of peritonitis.  No incarcerated hernias.  Ext:  No deformity.  No mjr edema.  No cyanosis Skin: No petechiae / purpura  Results:   Labs: Results for orders placed or performed during the hospital encounter of 02/17/18 (from the past 48 hour(s))  Urinalysis, Routine w reflex microscopic     Status: Abnormal   Collection Time: 02/17/18 11:38 PM  Result Value Ref Range   Color, Urine AMBER (A) YELLOW    Comment: BIOCHEMICALS MAY BE AFFECTED BY COLOR   APPearance CLOUDY (A) CLEAR   Specific Gravity, Urine 1.025 1.005 - 1.030   pH 6.0 5.0 - 8.0   Glucose, UA NEGATIVE NEGATIVE mg/dL   Hgb urine dipstick NEGATIVE NEGATIVE  Bilirubin Urine NEGATIVE NEGATIVE   Ketones, ur NEGATIVE NEGATIVE mg/dL   Protein, ur NEGATIVE NEGATIVE mg/dL   Nitrite NEGATIVE NEGATIVE   Leukocytes, UA NEGATIVE NEGATIVE    Comment: Performed at [facility] [address], [place], [place] [address]  Lipase, blood     Status: None   Collection Time: 02/17/18 11:47 PM  Result Value Ref Range   Lipase 36 11 - 51 U/L    Comment: Performed at [facility], [place] [address], [place], [place] [address]  Comprehensive metabolic panel     Status: Abnormal   Collection Time: 02/17/18 11:47 PM  Result Value Ref Range   Sodium 139 135 - 145 mmol/L   Potassium 3.6 3.5 - 5.1 mmol/L   Chloride 105 98 - 111 mmol/L   CO2 23 22 - 32 mmol/L   Glucose, Bld 103 (H) 70 - 99 mg/dL   BUN 6 6 - 20 mg/dL   Creatinine, Ser 0.83 0.44 - 1.00 mg/dL   Calcium 9.8 8.9 - 10.3 mg/dL   Total Protein 7.7 6.5 - 8.1 g/dL   Albumin 4.2 3.5 - 5.0 g/dL   AST 627 (H) 15 - 41 U/L   ALT 622 (H) 0 - 44 U/L   Alkaline Phosphatase 251 (H) 38 - 126 U/L   Total Bilirubin 0.7 0.3 - 1.2 mg/dL   GFR calc non Af Amer >60 >60 mL/min   GFR calc Af Amer >60 >60 mL/min    Comment: (NOTE) The eGFR has been calculated using the CKD EPI equation. This calculation has not been validated in all clinical situations. eGFR's persistently <60 mL/min signify possible Chronic Kidney Disease.    Anion gap 11 5 - 15    Comment: Performed at [facility] [address] [place], [place] [address]  CBC     Status: Abnormal   Collection Time: 02/17/18 11:47 PM  Result Value Ref Range   WBC 4.6 4.0 - 10.5 K/uL   RBC 4.00 3.87 - 5.11 MIL/uL   Hemoglobin 10.8 (L) 12.0 - 15.0 g/dL   HCT 34.0 (L) 36.0 - 46.0 %   MCV 85.0 80.0 - 100.0 fL   MCH 27.0 26.0 - 34.0 pg   MCHC 31.8 30.0 - 36.0 g/dL   RDW 14.1 11.5 - 15.5 %   Platelets 315 150 - 400 K/uL   nRBC 0.0 0.0 - 0.2 %    Comment: Performed at [facility], [place] [address] [place], [place] [address]  Acetaminophen level     Status: Abnormal   Collection Time: 02/17/18 11:47 PM  Result Value Ref Range   Acetaminophen (Tylenol), Serum <10 (L) 10 - 30 ug/mL    Comment: (NOTE) Therapeutic concentrations vary significantly. A range of 10-30 ug/mL  may be an effective concentration for many patients. However, some  are best treated at concentrations outside of this range. Acetaminophen concentrations >150 ug/mL at 4 hours after ingestion  and >50 ug/mL  at 12 hours after ingestion are often associated with  toxic reactions. Performed at [facility], [place] [address] [place], [place] [address]   I-Stat beta hCG blood, ED     Status: None   Collection Time: 02/17/18 11:59 PM  Result Value Ref Range   I-stat hCG, quantitative <5.0 <5 mIU/mL   Comment 3            Comment:   GEST. AGE      CONC.  (  mIU/mL)   <=1 WEEK        5 - 50     2 WEEKS       50 - 500     3 WEEKS       100 - 10,000     4 WEEKS     1,000 - 30,000        FEMALE AND NON-PREGNANT FEMALE:     LESS THAN 5 mIU/mL   Surgical pcr screen     Status: None   Collection Time: 02/18/18  8:23 AM  Result Value Ref Range   MRSA, PCR NEGATIVE NEGATIVE   Staphylococcus aureus NEGATIVE NEGATIVE    Comment: (NOTE) The Xpert SA Assay (FDA approved for NASAL specimens in patients 64 years of age and older), is one component of a comprehensive surveillance program. It is not intended to diagnose infection nor to guide or monitor treatment. Performed at [facility], [place] [address], [place], [place] [address]   CBC     Status: Abnormal   Collection Time: 02/19/18  5:53 AM  Result Value Ref Range   WBC 5.4 4.0 - 10.5 K/uL   RBC 3.75 (L) 3.87 - 5.11 MIL/uL   Hemoglobin 9.8 (L) 12.0 - 15.0 g/dL   HCT 32.1 (L) 36.0 - 46.0 %   MCV 85.6 80.0 - 100.0 fL   MCH 26.1 26.0 - 34.0 pg   MCHC 30.5 30.0 - 36.0 g/dL   RDW 14.2 11.5 - 15.5 %   Platelets 315 150 - 400 K/uL   nRBC 0.0 0.0 - 0.2 %    Comment: Performed at [facility], [place] [address], [place], [place] [address]  Comprehensive metabolic panel     Status: Abnormal   Collection Time: 02/19/18  5:53 AM  Result Value Ref Range   Sodium 137 135 - 145 mmol/L   Potassium 3.6 3.5 - 5.1 mmol/L   Chloride 104 98 - 111 mmol/L   CO2 26 22 - 32 mmol/L   Glucose, Bld 100 (H) 70 - 99 mg/dL   BUN <5 (L) 6 - 20 mg/dL   Creatinine, Ser 0.90 0.44 - 1.00 mg/dL   Calcium 9.0 8.9 - 10.3 mg/dL   Total Protein 6.4 (L) 6.5 - 8.1  g/dL   Albumin 3.4 (L) 3.5 - 5.0 g/dL   AST 569 (H) 15 - 41 U/L   ALT 605 (H) 0 - 44 U/L   Alkaline Phosphatase 205 (H) 38 - 126 U/L   Total Bilirubin 0.9 0.3 - 1.2 mg/dL   GFR calc non Af Amer >60 >60 mL/min   GFR calc Af Amer >60 >60 mL/min    Comment: (NOTE) The eGFR has been calculated using the CKD EPI equation. This calculation has not been validated in all clinical situations. eGFR's persistently <60 mL/min signify possible Chronic Kidney Disease.    Anion gap 7 5 - 15    Comment: Performed at [facility] [address] [place], [place] [address]    Imaging / Studies: US Abdomen Limited  Result Date: 02/18/2018 CLINICAL DATA:  Acute onset of right upper quadrant abdominal pain. EXAM: ULTRASOUND ABDOMEN LIMITED RIGHT UPPER QUADRANT COMPARISON:  Right upper quadrant ultrasound performed 02/15/2018 FINDINGS: Gallbladder: Numerous stones are seen within the gallbladder. The gallbladder wall is borderline normal in thickness. No ultrasonographic Murphy's sign is elicited. No pericholecystic fluid is identified. Common bile duct: Diameter: 0.5 cm, within normal limits in caliber. Liver: No focal lesion  identified. Within normal limits in parenchymal echogenicity. Portal vein is patent on color Doppler imaging with normal direction of blood flow towards the liver. IMPRESSION: Cholelithiasis; gallbladder otherwise grossly unremarkable. No acute abnormality of the right upper quadrant. Electronically Signed   By: Garald Balding M.D.   On: 02/18/2018 04:15    Medications / Allergies: per chart  Antibiotics: Anti-infectives (From admission, onward)   Start     Dose/Rate Route Frequency Ordered Stop   02/18/18 0824  cefTRIAXone (ROCEPHIN) 2 g in sodium chloride 0.9 % 100 mL IVPB     2 g 200 mL/hr over 30 Minutes Intravenous On call to O.R. 02/18/18 0822 02/18/18 1026   02/18/18 0815  cefTRIAXone (ROCEPHIN) 2 g in sodium chloride 0.9 % 100 mL IVPB  Status:  Discontinued     2  g 200 mL/hr over 30 Minutes Intravenous On call to O.R. 02/18/18 0813 02/18/18 0820        Note: Portions of this report may have been transcribed using voice recognition software. Every effort was made to ensure accuracy; however, inadvertent computerized transcription errors may be present.   Any transcriptional errors that result from this process are unintentional.     Adin Hector, MD, FACS, MASCRS Gastrointestinal and Minimally Invasive Surgery    [address] [place], [place] [address] [phone] Main / Paging [phone] Fax

## 2018-02-19 NOTE — Anesthesia Postprocedure Evaluation (Signed)
Anesthesia Post Note  Patient: Bethany Gallagher  Procedure(s) Performed: LAPAROSCOPIC CHOLECYSTECTOMY (N/A )     Patient location during evaluation: PACU Anesthesia Type: General Level of consciousness: awake and alert Pain management: pain level controlled Vital Signs Assessment: post-procedure vital signs reviewed and stable Respiratory status: spontaneous breathing, nonlabored ventilation, respiratory function stable and patient connected to nasal cannula oxygen Cardiovascular status: blood pressure returned to baseline and stable Postop Assessment: no apparent nausea or vomiting Anesthetic complications: no    Last Vitals:  Vitals:   02/19/18 0247 02/19/18 0615  BP: 110/71 (!) 126/96  Pulse: 63 68  Resp: 18 18  Temp: 37 C 36.8 C  SpO2: 100% 98%    Last Pain:  Vitals:   02/19/18 0615  TempSrc: Oral  PainSc:                  Cornie Mccomber DAVID

## 2018-02-20 ENCOUNTER — Encounter (HOSPITAL_COMMUNITY): Payer: Self-pay | Admitting: Surgery

## 2018-02-20 DIAGNOSIS — R110 Nausea: Secondary | ICD-10-CM

## 2018-02-20 MED ORDER — ONDANSETRON HCL 4 MG/2ML IJ SOLN
4.0000 mg | Freq: Four times a day (QID) | INTRAMUSCULAR | Status: DC
  Administered 2018-02-20 – 2018-02-21 (×5): 4 mg via INTRAVENOUS
  Filled 2018-02-20 (×5): qty 2

## 2018-02-20 MED ORDER — LACTATED RINGERS IV BOLUS
1000.0000 mL | Freq: Once | INTRAVENOUS | Status: AC
  Administered 2018-02-20: 1000 mL via INTRAVENOUS

## 2018-02-20 MED ORDER — SCOPOLAMINE 1 MG/3DAYS TD PT72
1.0000 | MEDICATED_PATCH | TRANSDERMAL | Status: DC
  Administered 2018-02-20: 1.5 mg via TRANSDERMAL
  Filled 2018-02-20 (×2): qty 1

## 2018-02-20 MED ORDER — OXYCODONE HCL 5 MG PO TABS: 5.0000 mg | ORAL_TABLET | ORAL | 0 refills | Status: DC | PRN

## 2018-02-20 MED ORDER — NAPROXEN 500 MG PO TABS: 500.0000 mg | ORAL_TABLET | Freq: Two times a day (BID) | ORAL | 1 refills | Status: DC

## 2018-02-20 MED ORDER — ONDANSETRON HCL 4 MG PO TABS: 4.0000 mg | ORAL_TABLET | Freq: Four times a day (QID) | ORAL | 2 refills | Status: DC | PRN

## 2018-02-20 NOTE — Progress Notes (Addendum)
Bethany Gallagher [id number] 02/16/1991  CARE TEAM:  PCP: No primary care provider on file.  Outpatient Care Team: No care team member to display  Inpatient Treatment Team: Treatment Team: Attending Provider: Edison Pace, Md, MD; Consulting Physician: Edison Pace, Md, MD; Technician: Gasper Sells, NT; Registered Nurse: Wayna Chalet, RN; Registered Nurse: Dagoberto Ligas, RN; Registered Nurse: Fransisco Hertz, RN; Registered Nurse: Franklyn Lor, RN; Technician: Warden Fillers, Roslyn; Registered Nurse: Mayra Neer D, RN   Problem List:   Principal Problem:   Acute with chronic cholecystitis s/p lap cholecystectomy 02/18/2018 Active Problems:   Transaminitis   Hx of chlamydia infection   GERD (gastroesophageal reflux disease)   2 Days Post-Op  02/18/2018  Procedure(s): LAPAROSCOPIC CHOLECYSTECTOMY    Assessment  Fair  [facility] Stay = 2 days)  Plan:  Challenge to read.  .  I was initially hesitant to discharge her, but then she reassured me that she was feeling much better despite some morning nausea.  No issues with pain medications.  Walking well in hallways.  Pain much less.  She really wanted to get home.  I wrote discharge orders.  Then she vomited.  Go back to to a dysphagia 1/full liquid diet.  Advance to soft diet as tolerated.  IV fluid bolus.  I think she has multifactorial nausea and vomiting.  Add scopolamine patch since she seems to have some vertigo/dizziness related to this.  Place on standing Zofran for now.  Negative pregnancy test 3 days ago  Nonnarcotic pain control.  Denies nausea with current narcotics so hold off on switching that  VTE prophylaxis- SCDs, etc  Mobilize as tolerated to help recovery  D/C patient from hospital when patient meets criteria (anticipate in ??1-2 day(s)):  Tolerating oral intake well Ambulating well Adequate pain control without IV medications Urinating  Having flatus Disposition planning in place   20 minutes  spent in review, evaluation, examination, counseling, and coordination of care.  More than 50% of that time was spent in counseling.  02/20/2018    Subjective: (Chief complaint)  Woke up a little nauseated.  She feels like she gets nauseated and dizzy when she tries to sit up.  Walking hallways.  Nauseated when she tried tacos yesterday (?!)  despite ordered for dysphagia 1/pured diet.  Feels better this morning and really wants to go home.  Objective:  Vital signs:  Vitals:   02/19/18 1047 02/19/18 1500 02/19/18 2109 02/20/18 0529  BP: 105/77 (!) 126/96 (!) 121/92 127/89  Pulse: 67 83 61 78  Resp: 16 16 16 18   Temp: (!) 97.5 F (36.4 C) 98 F (36.7 C) 98.4 F (36.9 C) 98.6 F (37 C)  TempSrc: Oral Oral Oral Oral  SpO2: 98% 97% 100% 100%  Weight:      Height:        Last BM Date: 02/17/18  Intake/Output   Yesterday:  No intake/output data recorded. This shift:  No intake/output data recorded.  Bowel function:  Flatus: YES  BM:  YES  Drain: (No drain)   Physical Exam:  General: Pt awake/alert/oriented x4 in no acute distress Eyes: PERRL, normal EOM.  Sclera clear.  No icterus Neuro: CN II-XII intact w/o focal sensory/motor deficits. Lymph: No head/neck/groin lymphadenopathy Psych:  No delerium/psychosis/paranoia HENT: Normocephalic, Mucus membranes moist.  No thrush Neck: Supple, No tracheal deviation Chest: No chest wall pain w good excursion CV:  Pulses intact.  Regular rhythm MS: Normal AROM mjr joints.  No obvious deformity  Abdomen:  Soft.  Nondistended.  Mildly tender at incisions only.  No evidence of peritonitis.  No incarcerated hernias.  Ext:  No deformity.  No mjr edema.  No cyanosis Skin: No petechiae / purpura  Results:   Labs: Results for orders placed or performed during the hospital encounter of 02/17/18 (from the past 48 hour(s))  CBC     Status: Abnormal   Collection Time: 02/19/18  5:53 AM  Result Value Ref Range   WBC 5.4  4.0 - 10.5 K/uL   RBC 3.75 (L) 3.87 - 5.11 MIL/uL   Hemoglobin 9.8 (L) 12.0 - 15.0 g/dL   HCT 32.1 (L) 36.0 - 46.0 %   MCV 85.6 80.0 - 100.0 fL   MCH 26.1 26.0 - 34.0 pg   MCHC 30.5 30.0 - 36.0 g/dL   RDW 14.2 11.5 - 15.5 %   Platelets 315 150 - 400 K/uL   nRBC 0.0 0.0 - 0.2 %    Comment: Performed at [facility], [place] [address], [place], [place] [address]  Comprehensive metabolic panel     Status: Abnormal   Collection Time: 02/19/18  5:53 AM  Result Value Ref Range   Sodium 137 135 - 145 mmol/L   Potassium 3.6 3.5 - 5.1 mmol/L   Chloride 104 98 - 111 mmol/L   CO2 26 22 - 32 mmol/L   Glucose, Bld 100 (H) 70 - 99 mg/dL   BUN <5 (L) 6 - 20 mg/dL   Creatinine, Ser 0.90 0.44 - 1.00 mg/dL   Calcium 9.0 8.9 - 10.3 mg/dL   Total Protein 6.4 (L) 6.5 - 8.1 g/dL   Albumin 3.4 (L) 3.5 - 5.0 g/dL   AST 569 (H) 15 - 41 U/L   ALT 605 (H) 0 - 44 U/L   Alkaline Phosphatase 205 (H) 38 - 126 U/L   Total Bilirubin 0.9 0.3 - 1.2 mg/dL   GFR calc non Af Amer >60 >60 mL/min   GFR calc Af Amer >60 >60 mL/min    Comment: (NOTE) The eGFR has been calculated using the CKD EPI equation. This calculation has not been validated in all clinical situations. eGFR's persistently <60 mL/min signify possible Chronic Kidney Disease.    Anion gap 7 5 - 15    Comment: Performed at [facility] [address], [place], [place] [address]    Imaging / Studies: No results found.  Medications / Allergies: per chart  Antibiotics: Anti-infectives (From admission, onward)   Start     Dose/Rate Route Frequency Ordered Stop   02/18/18 0824  cefTRIAXone (ROCEPHIN) 2 g in sodium chloride 0.9 % 100 mL IVPB     2 g 200 mL/hr over 30 Minutes Intravenous On call to O.R. 02/18/18 0822 02/18/18 1026   02/18/18 0815  cefTRIAXone (ROCEPHIN) 2 g in sodium chloride 0.9 % 100 mL IVPB  Status:  Discontinued     2 g 200 mL/hr over 30 Minutes Intravenous On call to O.R. 02/18/18 0813 02/18/18 0820         Note: Portions of this report may have been transcribed using voice recognition software. Every effort was made to ensure accuracy; however, inadvertent computerized transcription errors may be present.   Any transcriptional errors that result from this process are unintentional.     Adin Hector, MD, FACS, MASCRS Gastrointestinal and Minimally Invasive Surgery    [address] [place], [place] [address] [phone] Main / Paging [phone] Fax

## 2018-02-20 NOTE — Discharge Summary (Signed)
Physician Discharge Summary    Patient ID: Bethany Gallagher MRN: [id number] DOB/AGE: 09-20-1990  27 y.o.  Admit date: 02/17/2018 Discharge date: 02/20/2018   Hospital Stay = 2 days  No care team member to display  Discharge Diagnoses:  Principal Problem:   Acute with chronic cholecystitis s/p lap cholecystectomy 02/18/2018 Active Problems:   Transaminitis   Hx of chlamydia infection   GERD (gastroesophageal reflux disease)   2 Days Post-Op  02/18/2018  POST-OPERATIVE DIAGNOSIS:   Biliary colic  SURGERY:  00/93/8182  Procedure(s): LAPAROSCOPIC CHOLECYSTECTOMY  SURGEON:    Surgeon(s): Clovis Riley, MD  Consults: None  Hospital Course:   Patient was receded episodes of biliary colic with multiple ER visits.  Worsened with failure to thrive.  Admitted.  Hydrated.  Underwent cholecystectomy.  Postoperatively, the patient gradually mobilized and advanced to a solid diet.  Pain and other symptoms were treated aggressively.  She did struggle with some nausea upon waking up but seemed to be under better control.  Controlled with Zofran.  No nausea with pain medications.  By the time of discharge, the patient was walking well the hallways, eating soft food, having flatus.  Pain & nausea was controlled on an oral medications.  Based on meeting discharge criteria and continuing to recover, I felt it was safe for the patient to be discharged from the hospital to further recover with close followup. Postoperative recommendations were discussed in detail.  They are written as well.  Discharged Condition: fair  Disposition:  [facility], [place]. Go on 03/07/2018.   Specialty:  General Surgery Why:  Your appointment is 12/2 at 9:45 am Please arrive 30 minutes prior to your appointment to check in and fill out paperwork. Bring photo ID and insurance information. Contact information: [address] [place]  [place] [phone]          Discharge disposition: 01-Home or Self Care       Discharge Instructions    Call MD for:   Complete by:  As directed    FEVER > 101.5 F  (temperatures < 101.5 F are not significant)   Call MD for:  extreme fatigue   Complete by:  As directed    Call MD for:  persistant dizziness or light-headedness   Complete by:  As directed    Call MD for:  persistant nausea and vomiting   Complete by:  As directed    Call MD for:  redness, tenderness, or signs of infection (pain, swelling, redness, odor or green/yellow discharge around incision site)   Complete by:  As directed    Call MD for:  severe uncontrolled pain   Complete by:  As directed    Diet - low sodium heart healthy   Complete by:  As directed    Start with a bland diet such as soups, liquids, starchy foods, low fat foods, etc. the first few days at home. Gradually advance to a solid, low-fat, high fiber diet by the end of the first week at home.   Add a fiber supplement to your diet (Metamucil, etc) If you feel full, bloated, or constipated, stay on a full liquid or pureed/blenderized diet for a few days until you feel better and are no longer constipated.   Discharge instructions   Complete by:  As directed    See Discharge Instructions If you are not getting better after two weeks or are noticing you are getting  worse, contact our office [phone] for further advice.  We may need to adjust your medications, re-evaluate you in the office, send you to the emergency room, or see what other things we can do to help. The clinic staff is available to answer your questions during regular business hours (8:30am-5pm).  Please don't hesitate to call and ask to speak to one of our nurses for clinical concerns.    A surgeon from [facility] is always on call at the hospitals 24 hours/day If you have a medical emergency, go to the nearest emergency room or call 911.   Discharge  wound care:   Complete by:  As directed    It is good for closed incisions and even open wounds to be washed every day.  Shower every day.  Short baths are fine.  Wash the incisions and wounds clean with soap & water.     You may leave closed incisions open to air if it is dry.   You may cover the incision with clean gauze & replace it after your daily shower for comfort.   You have skin glue (Dermabond) on your incision, so leave them in place.  They will fall off on their own like a scab.  You may trim any edges that curl up with clean scissors.   Driving Restrictions   Complete by:  As directed    You may drive when: - you are no longer taking narcotic prescription pain medication - you can comfortably wear a seatbelt - you can safely make sudden turns/stops without pain.   Increase activity slowly   Complete by:  As directed    Start light daily activities --- self-care, walking, climbing stairs- beginning the day after surgery.  Gradually increase activities as tolerated.  Control your pain to be active.  Stop when you are tired.  Ideally, walk several times a day, eventually an hour a day.   Most people are back to most day-to-day activities in a few weeks.  It takes 4-6 weeks to get back to unrestricted, intense activity. If you can walk 30 minutes without difficulty, it is safe to try more intense activity such as jogging, treadmill, bicycling, low-impact aerobics, swimming, etc. Save the most intensive and strenuous activity for last (Usually 4-8 weeks after surgery) such as sit-ups, heavy lifting, contact sports, etc.  Refrain from any intense heavy lifting or straining until you are off narcotics for pain control.  You will have off days, but things should improve week-by-week. DO NOT PUSH THROUGH PAIN.  Let pain be your guide: If it hurts to do something, don't do it.   Lifting restrictions   Complete by:  As directed    If you can walk 30 minutes without difficulty, it is safe to  try more intense activity such as jogging, treadmill, bicycling, low-impact aerobics, swimming, etc. Save the most intensive and strenuous activity for last (Usually 4-8 weeks after surgery) such as sit-ups, heavy lifting, contact sports, etc.   Refrain from any intense heavy lifting or straining until you are off narcotics for pain control.  You will have off days, but things should improve week-by-week. DO NOT PUSH THROUGH PAIN.  Let pain be your guide: If it hurts to do something, don't do it.  Pain is your body warning you to avoid that activity for another week until the pain goes down.   May shower / Bathe   Complete by:  As directed    May walk  up steps   Complete by:  As directed    Sexual Activity Restrictions   Complete by:  As directed    You may have sexual intercourse when it is comfortable. If it hurts to do something, stop.      Allergies as of 02/20/2018   No Known Allergies     Medication List    TAKE these medications   naproxen 500 MG tablet Commonly known as:  NAPROSYN Take 1 tablet (500 mg total) by mouth 2 (two) times daily with a meal.   ondansetron 4 MG tablet Commonly known as:  ZOFRAN Take 1 tablet (4 mg total) by mouth every 6 (six) hours as needed for nausea or vomiting. What changed:  reasons to take this   oxyCODONE 5 MG immediate release tablet Commonly known as:  Oxy IR/ROXICODONE Take 1-2 tablets (5-10 mg total) by mouth every 4 (four) hours as needed for moderate pain, severe pain or breakthrough pain.            Discharge Care Instructions  (From admission, onward)         Start     Ordered   02/20/18 0000  Discharge wound care:    Comments:  It is good for closed incisions and even open wounds to be washed every day.  Shower every day.  Short baths are fine.  Wash the incisions and wounds clean with soap & water.     You may leave closed incisions open to air if it is dry.   You may cover the incision with clean gauze & replace it  after your daily shower for comfort.   You have skin glue (Dermabond) on your incision, so leave them in place.  They will fall off on their own like a scab.  You may trim any edges that curl up with clean scissors.   02/20/18 0938          Significant Diagnostic Studies:  Results for orders placed or performed during the hospital encounter of 02/17/18 (from the past 72 hour(s))  Urinalysis, Routine w reflex microscopic     Status: Abnormal   Collection Time: 02/17/18 11:38 PM  Result Value Ref Range   Color, Urine AMBER (A) YELLOW    Comment: BIOCHEMICALS MAY BE AFFECTED BY COLOR   APPearance CLOUDY (A) CLEAR   Specific Gravity, Urine 1.025 1.005 - 1.030   pH 6.0 5.0 - 8.0   Glucose, UA NEGATIVE NEGATIVE mg/dL   Hgb urine dipstick NEGATIVE NEGATIVE   Bilirubin Urine NEGATIVE NEGATIVE   Ketones, ur NEGATIVE NEGATIVE mg/dL   Protein, ur NEGATIVE NEGATIVE mg/dL   Nitrite NEGATIVE NEGATIVE   Leukocytes, UA NEGATIVE NEGATIVE    Comment: Performed at [facility] [address], [place], [place] [address]  Lipase, blood     Status: None   Collection Time: 02/17/18 11:47 PM  Result Value Ref Range   Lipase 36 11 - 51 U/L    Comment: Performed at [facility], [place] [address] [place], [place] [address]  Comprehensive metabolic panel     Status: Abnormal   Collection Time: 02/17/18 11:47 PM  Result Value Ref Range   Sodium 139 135 - 145 mmol/L   Potassium 3.6 3.5 - 5.1 mmol/L   Chloride 105 98 - 111 mmol/L   CO2 23 22 - 32 mmol/L   Glucose, Bld 103 (H) 70 - 99 mg/dL   BUN 6 6 - 20 mg/dL   Creatinine, Ser  0.83 0.44 - 1.00 mg/dL   Calcium 9.8 8.9 - 10.3 mg/dL   Total Protein 7.7 6.5 - 8.1 g/dL   Albumin 4.2 3.5 - 5.0 g/dL   AST 627 (H) 15 - 41 U/L   ALT 622 (H) 0 - 44 U/L   Alkaline Phosphatase 251 (H) 38 - 126 U/L   Total Bilirubin 0.7 0.3 - 1.2 mg/dL   GFR calc non Af Amer >60 >60 mL/min   GFR calc Af Amer >60 >60 mL/min    Comment: (NOTE) The eGFR has been  calculated using the CKD EPI equation. This calculation has not been validated in all clinical situations. eGFR's persistently <60 mL/min signify possible Chronic Kidney Disease.    Anion gap 11 5 - 15    Comment: Performed at [facility] [address], [place], [place] [address]  CBC     Status: Abnormal   Collection Time: 02/17/18 11:47 PM  Result Value Ref Range   WBC 4.6 4.0 - 10.5 K/uL   RBC 4.00 3.87 - 5.11 MIL/uL   Hemoglobin 10.8 (L) 12.0 - 15.0 g/dL   HCT 34.0 (L) 36.0 - 46.0 %   MCV 85.0 80.0 - 100.0 fL   MCH 27.0 26.0 - 34.0 pg   MCHC 31.8 30.0 - 36.0 g/dL   RDW 14.1 11.5 - 15.5 %   Platelets 315 150 - 400 K/uL   nRBC 0.0 0.0 - 0.2 %    Comment: Performed at [facility], [place] [address], [place], [place] [address]  Acetaminophen level     Status: Abnormal   Collection Time: 02/17/18 11:47 PM  Result Value Ref Range   Acetaminophen (Tylenol), Serum <10 (L) 10 - 30 ug/mL    Comment: (NOTE) Therapeutic concentrations vary significantly. A range of 10-30 ug/mL  may be an effective concentration for many patients. However, some  are best treated at concentrations outside of this range. Acetaminophen concentrations >150 ug/mL at 4 hours after ingestion  and >50 ug/mL at 12 hours after ingestion are often associated with  toxic reactions. Performed at [facility], [place] [address], [place], [place] [address]   I-Stat beta hCG blood, ED     Status: None   Collection Time: 02/17/18 11:59 PM  Result Value Ref Range   I-stat hCG, quantitative <5.0 <5 mIU/mL   Comment 3            Comment:   GEST. AGE      CONC.  (mIU/mL)   <=1 WEEK        5 - 50     2 WEEKS       50 - 500     3 WEEKS       100 - 10,000     4 WEEKS     1,000 - 30,000        FEMALE AND NON-PREGNANT FEMALE:     LESS THAN 5 mIU/mL   Surgical pcr screen     Status: None   Collection Time: 02/18/18  8:23 AM  Result Value Ref Range   MRSA, PCR NEGATIVE NEGATIVE   Staphylococcus aureus  NEGATIVE NEGATIVE    Comment: (NOTE) The Xpert SA Assay (FDA approved for NASAL specimens in patients 58 years of age and older), is one component of a comprehensive surveillance program. It is not intended to diagnose infection nor to guide or monitor treatment. Performed at [facility], [place] [address], [place], [place]  [address]   CBC     Status: Abnormal   Collection Time: 02/19/18  5:53 AM  Result Value Ref Range   WBC 5.4 4.0 - 10.5 K/uL   RBC 3.75 (L) 3.87 - 5.11 MIL/uL   Hemoglobin 9.8 (L) 12.0 - 15.0 g/dL   HCT 32.1 (L) 36.0 - 46.0 %   MCV 85.6 80.0 - 100.0 fL   MCH 26.1 26.0 - 34.0 pg   MCHC 30.5 30.0 - 36.0 g/dL   RDW 14.2 11.5 - 15.5 %   Platelets 315 150 - 400 K/uL   nRBC 0.0 0.0 - 0.2 %    Comment: Performed at [facility], [place] [address], [place], [place] [address]  Comprehensive metabolic panel     Status: Abnormal   Collection Time: 02/19/18  5:53 AM  Result Value Ref Range   Sodium 137 135 - 145 mmol/L   Potassium 3.6 3.5 - 5.1 mmol/L   Chloride 104 98 - 111 mmol/L   CO2 26 22 - 32 mmol/L   Glucose, Bld 100 (H) 70 - 99 mg/dL   BUN <5 (L) 6 - 20 mg/dL   Creatinine, Ser 0.90 0.44 - 1.00 mg/dL   Calcium 9.0 8.9 - 10.3 mg/dL   Total Protein 6.4 (L) 6.5 - 8.1 g/dL   Albumin 3.4 (L) 3.5 - 5.0 g/dL   AST 569 (H) 15 - 41 U/L   ALT 605 (H) 0 - 44 U/L   Alkaline Phosphatase 205 (H) 38 - 126 U/L   Total Bilirubin 0.9 0.3 - 1.2 mg/dL   GFR calc non Af Amer >60 >60 mL/min   GFR calc Af Amer >60 >60 mL/min    Comment: (NOTE) The eGFR has been calculated using the CKD EPI equation. This calculation has not been validated in all clinical situations. eGFR's persistently <60 mL/min signify possible Chronic Kidney Disease.    Anion gap 7 5 - 15    Comment: Performed at [facility] [address], [place],  [address]    Nm Hepato W/eject Fract  Result Date: 02/16/2018 CLINICAL DATA:  Right upper quadrant pain EXAM: NUCLEAR MEDICINE  HEPATOBILIARY IMAGING WITH GALLBLADDER EF TECHNIQUE: Sequential images of the abdomen were obtained out to 60 minutes following intravenous administration of radiopharmaceutical. After oral ingestion of Ensure, gallbladder ejection fraction was determined. At 60 min, normal ejection fraction is greater than 33%. RADIOPHARMACEUTICALS:  Five mCi Tc-61m Choletec IV COMPARISON:  Ultrasound from the previous day FINDINGS: Prompt uptake and biliary excretion of activity by the liver is seen. Gallbladder activity is visualized, consistent with patency of cystic duct. Biliary activity passes into small bowel, consistent with patent common bile duct. Calculated gallbladder ejection fraction could not be adequately performed due to the significant patient motion. Evaluation of the send a images suggests some gallbladder emptying although the gallbladder ejection fraction is likely below normal. IMPRESSION: Normal uptake and excretion of biliary tracer. Gallbladder ejection fraction is felt to be below normal although a calculated number could not be performed due to significant patient motion artifact. Electronically Signed   By: MInez CatalinaM.D.   On: 02/16/2018 15:36   UKoreaAbdomen Limited  Result Date: 02/18/2018 CLINICAL DATA:  Acute onset of right upper quadrant abdominal pain. EXAM: ULTRASOUND ABDOMEN LIMITED RIGHT UPPER QUADRANT COMPARISON:  Right upper quadrant ultrasound performed 02/15/2018 FINDINGS: Gallbladder: Numerous stones are seen within the gallbladder. The gallbladder wall is borderline normal in thickness. No ultrasonographic Murphy's sign is elicited.  No pericholecystic fluid is identified. Common bile duct: Diameter: 0.5 cm, within normal limits in caliber. Liver: No focal lesion identified. Within normal limits in parenchymal echogenicity. Portal vein is patent on color Doppler imaging with normal direction of blood flow towards the liver. IMPRESSION: Cholelithiasis; gallbladder otherwise grossly  unremarkable. No acute abnormality of the right upper quadrant. Electronically Signed   By: Garald Balding M.D.   On: 02/18/2018 04:15    Discharge Exam: Blood pressure 127/89, pulse 78, temperature 98.6 F (37 C), temperature source Oral, resp. rate 18, height _0  (1.651 m), weight 56.7 kg, last menstrual period 02/02/2018, SpO2 100 %, not currently breastfeeding.  General: Pt awake/alert/oriented x4 in No acute distress Eyes: PERRL, normal EOM.  Sclera clear.  No icterus Neuro: CN II-XII intact w/o focal sensory/motor deficits. Lymph: No head/neck/groin lymphadenopathy Psych:  No delerium/psychosis/paranoia HENT: Normocephalic, Mucus membranes moist.  No thrush Neck: Supple, No tracheal deviation Chest: No chest wall pain w good excursion CV:  Pulses intact.  Regular rhythm MS: Normal AROM mjr joints.  No obvious deformity Abdomen: Soft.  Nondistended.  Mildly tender at incisions only.  No evidence of peritonitis.  No incarcerated hernias. Ext:  SCDs BLE.  No mjr edema.  No cyanosis Skin: No petechiae / purpura  Past Medical History:  Diagnosis Date  . GERD (gastroesophageal reflux disease)   . Hx of chlamydia infection   . Hx of gonorrhea   . NSVD (normal spontaneous vaginal delivery) 11/23/2014  . Pregnancy induced hypertension    2013   . RUQ abdominal pain 02/2018  . Vaginal Pap smear, abnormal     Past Surgical History:  Procedure Laterality Date  . CHOLECYSTECTOMY N/A 02/18/2018   Procedure: LAPAROSCOPIC CHOLECYSTECTOMY;  Surgeon: Clovis Riley, MD;  Location: [facility] OR;  Service: General;  Laterality: N/A;  . NO PAST SURGERIES      Social History   Socioeconomic History  . Marital status: Single    Spouse name: Not on file  . Number of children: Not on file  . Years of education: Not on file  . Highest education level: Not on file  Occupational History  . Not on file  Social Needs  . Financial resource strain: Not on file  . Food insecurity:    Worry:  Not on file    Inability: Not on file  . Transportation needs:    Medical: Not on file    Non-medical: Not on file  Tobacco Use  . Smoking status: Former Smoker    Types: Cigarettes  . Smokeless tobacco: Former Systems developer    Quit date: 11/05/2015  . Tobacco comment: with + preg test  Substance and Sexual Activity  . Alcohol use: Not Currently  . Drug use: Yes    Types: Marijuana    Comment: denies use of marajuana with current pregnancy  . Sexual activity: Not Currently    Birth control/protection: None  Lifestyle  . Physical activity:    Days per week: Not on file    Minutes per session: Not on file  . Stress: Not on file  Relationships  . Social connections:    Talks on phone: Not on file    Gets together: Not on file    Attends religious service: Not on file    Active member of club or organization: Not on file    Attends meetings of clubs or organizations: Not on file    Relationship status: Not on file  . Intimate partner violence:  Fear of current or ex partner: Not on file    Emotionally abused: Not on file    Physically abused: Not on file    Forced sexual activity: Not on file  Other Topics Concern  . Not on file  Social History Narrative  . Not on file    Family History  Problem Relation Age of Onset  . Cancer Maternal Grandfather   . Asthma Brother   . Alcohol abuse Neg Hx   . Diabetes Neg Hx   . Heart disease Neg Hx   . Hypertension Neg Hx   . Stroke Neg Hx     Current Facility-Administered Medications  Medication Dose Route Frequency Provider Last Rate Last Dose  . 0.9 %  sodium chloride infusion  250 mL Intravenous PRN Michael Boston, MD      . acetaminophen (TYLENOL) tablet 1,000 mg  1,000 mg Oral Q6H Meuth, Brooke A, PA-C   1,000 mg at 02/20/18 0537  . alum & mag hydroxide-simeth (MAALOX/MYLANTA) 200-200-20 MG/5ML suspension 30 mL  30 mL Oral Q6H PRN Michael Boston, MD      . bisacodyl (DULCOLAX) suppository 10 mg  10 mg Rectal Q12H PRN Michael Boston,  MD      . diphenhydrAMINE (BENADRYL) 12.5 MG/5ML elixir 12.5 mg  12.5 mg Oral Q6H PRN Meuth, Brooke A, PA-C       Or  . diphenhydrAMINE (BENADRYL) injection 12.5 mg  12.5 mg Intravenous Q6H PRN Meuth, Brooke A, PA-C   12.5 mg at 02/18/18 1249  . feeding supplement (ENSURE SURGERY) liquid 237 mL  237 mL Oral BID BM Michael Boston, MD      . gabapentin (NEURONTIN) capsule 200 mg  200 mg Oral TID Michael Boston, MD   200 mg at 02/20/18 0935  . guaiFENesin-dextromethorphan (ROBITUSSIN DM) 100-10 MG/5ML syrup 10 mL  10 mL Oral Q4H PRN Michael Boston, MD      . heparin injection 5,000 Units  5,000 Units Subcutaneous Q8H Meuth, Brooke A, PA-C   5,000 Units at 02/19/18 2234  . hydrALAZINE (APRESOLINE) injection 10 mg  10 mg Intravenous Q2H PRN Meuth, Brooke A, PA-C      . hydrocortisone (ANUSOL-HC) 2.5 % rectal cream 1 application  1 application Topical QID PRN Michael Boston, MD      . hydrocortisone cream 1 % 1 application  1 application Topical TID PRN Michael Boston, MD      . HYDROmorphone (DILAUDID) injection 0.5-2 mg  0.5-2 mg Intravenous Q2H PRN Michael Boston, MD   0.5 mg at 02/19/18 0950  . lip balm (CARMEX) ointment 1 application  1 application Topical BID Michael Boston, MD      . magic mouthwash  15 mL Oral QID PRN Michael Boston, MD      . menthol-cetylpyridinium (CEPACOL) lozenge 3 mg  1 lozenge Oral PRN Michael Boston, MD      . methocarbamol (ROBAXIN) 1,000 mg in dextrose 5 % 50 mL IVPB  1,000 mg Intravenous Q6H PRN Michael Boston, MD      . methocarbamol (ROBAXIN) tablet 1,000 mg  1,000 mg Oral Q6H PRN Michael Boston, MD      . metoCLOPramide (REGLAN) injection 5-10 mg  5-10 mg Intravenous Q8H PRN Michael Boston, MD      . naproxen (NAPROSYN) tablet 500 mg  500 mg Oral BID WC Michael Boston, MD   500 mg at 02/20/18 0935  . ondansetron (ZOFRAN-ODT) disintegrating tablet 4 mg  4 mg Oral Q6H PRN Meuth, Blaine Hamper,  PA-C   4 mg at 02/19/18 1645   Or  . ondansetron (ZOFRAN) injection 4 mg  4 mg Intravenous  Q6H PRN Meuth, Brooke A, PA-C   4 mg at 02/19/18 2054  . oxyCODONE (Oxy IR/ROXICODONE) immediate release tablet 5-10 mg  5-10 mg Oral Q4H PRN Michael Boston, MD   10 mg at 02/20/18 0537  . phenol (CHLORASEPTIC) mouth spray 1-2 spray  1-2 spray Mouth/Throat PRN Michael Boston, MD      . prochlorperazine (COMPAZINE) injection 5-10 mg  5-10 mg Intravenous Q4H PRN Michael Boston, MD      . psyllium (HYDROCIL/METAMUCIL) packet 1 packet  1 packet Oral Daily Michael Boston, MD   1 packet at 02/19/18 1508  . simethicone (MYLICON) chewable tablet 40 mg  40 mg Oral Q6H PRN Meuth, Brooke A, PA-C      . sodium chloride 0.9 % bolus 1,000 mL  1,000 mL Intravenous Q8H PRN Della Homan, Remo Lipps, MD      . sodium chloride flush (NS) 0.9 % injection 3 mL  3 mL Intravenous Gorden Harms, MD      . sodium chloride flush (NS) 0.9 % injection 3 mL  3 mL Intravenous PRN Michael Boston, MD         No Known Allergies  Signed: Morton Peters, MD, FACS, MASCRS [facility] and [facility]    [address], [place], [place] [address] [phone] Main / Paging [phone] Fax   02/20/2018, 9:39 AM

## 2018-02-21 NOTE — Discharge Summary (Signed)
[facility] Discharge Summary   Patient ID: Bethany Gallagher MRN: [id number] DOB/AGE: 29-Dec-1990 27 y.o.  Admit date: 02/17/2018 Discharge date: 02/21/2018  Discharge Diagnosis Patient Active Problem List   Diagnosis Date Noted  . Chronic nausea 02/20/2018  . Hx of chlamydia infection   . GERD (gastroesophageal reflux disease)   . Acute with chronic cholecystitis s/p lap cholecystectomy 02/18/2018 02/18/2018  . Transaminitis 02/15/2018  . Supervision of normal pregnancy in third trimester 07/22/2017  . Mild tetrahydrocannabinol (THC) abuse 04/13/2016    Imaging: No results found.  Procedures LAPAROSCOPIC CHOLECYSTECTOMY  SURGEON: Bethany Bue, MD   Hospital Course:   Patient w recent episodes of biliary colic with multiple ER visits.  Worsened with failure to thrive.  Admitted.  Hydrated.  Underwent cholecystectomy.  Postoperatively, the patient gradually mobilized and advanced to a solid diet.  Pain and other symptoms were treated aggressively.  She did struggle with some nausea which improved with nausea medication and slow advancement of diet. No nausea with pain medications.  By the time of discharge, the patient was walking well the hallways, eating soft food, having flatus.  Pain & nausea was controlled on an oral medications.  Based on meeting discharge criteria and continuing to recover, I felt it was safe for the patient to be discharged from the hospital to further recover with close followup. Postoperative recommendations were discussed in detail.  They are written as well.  Physical Exam: General:  Alert, NAD, pleasant, comfortable Abd:  Soft, ND, mild tenderness, incisions C/D/I  Allergies as of 02/21/2018   No Known Allergies     Medication List    TAKE these medications   naproxen 500 MG tablet Commonly known as:  NAPROSYN Take 1 tablet (500 mg total) by mouth 2 (two) times daily with a meal.   ondansetron 4 MG tablet Commonly  known as:  ZOFRAN Take 1 tablet (4 mg total) by mouth every 6 (six) hours as needed for nausea or vomiting. What changed:  reasons to take this   oxyCODONE 5 MG immediate release tablet Commonly known as:  Oxy IR/ROXICODONE Take 1-2 tablets (5-10 mg total) by mouth every 4 (four) hours as needed for moderate pain, severe pain or breakthrough pain.            Discharge Care Instructions  (From admission, onward)         Start     Ordered   02/20/18 0000  Discharge wound care:    Comments:  It is good for closed incisions and even open wounds to be washed every day.  Shower every day.  Short baths are fine.  Wash the incisions and wounds clean with soap & water.     You may leave closed incisions open to air if it is dry.   You may cover the incision with clean gauze & replace it after your daily shower for comfort.   You have skin glue (Dermabond) on your incision, so leave them in place.  They will fall off on their own like a scab.  You may trim any edges that curl up with clean scissors.   02/20/18 7829           Follow-up Information    [facility] Go on 03/07/2018.   Specialty:  General Surgery Why:  Your appointment is 12/2 at 9:45 am Please arrive 30 minutes prior to your appointment to check in and fill out paperwork. Bring photo ID and insurance information.  Contact information: [address] [place] [id number] [phone]          Signed: Hosie Spanglelizabeth Ottavio Gallagher, [facility] 02/21/2018, 9:47 AM

## 2018-11-12 ENCOUNTER — Encounter (HOSPITAL_COMMUNITY): Payer: Self-pay

## 2018-11-12 ENCOUNTER — Other Ambulatory Visit: Payer: Self-pay

## 2018-11-12 ENCOUNTER — Ambulatory Visit (HOSPITAL_COMMUNITY)
Admission: EM | Admit: 2018-11-12 | Discharge: 2018-11-12 | Disposition: A | Discharge status: Home / Self Care | Payer: Medicaid Other | Attending: Family Medicine | Admitting: Family Medicine

## 2018-11-12 DIAGNOSIS — N760 Acute vaginitis: Secondary | ICD-10-CM | POA: Insufficient documentation

## 2018-11-12 MED ORDER — FLUCONAZOLE 150 MG PO TABS: ORAL_TABLET | ORAL | 0 refills | Status: DC

## 2018-11-12 NOTE — ED Provider Notes (Signed)
[facility]    CSN: [id number] Arrival date & time: 11/12/18  1611      History   Chief Complaint Chief Complaint  Patient presents with  . Vaginitis    HPI Bethany Gallagher is a 28 y.o. female.   Bethany Gallagher presents with complaints of vaginal itching which started approximately 2 weeks ago. Thick "cottage cheese" like discharge, but does appear brown at times. She is just completing her period, however. No pelvic pain, back pain, urinary symptoms, fevers. States she occasionally has perineal rash which itches but denies  Any currently. She is sexually active, no specific known STD exposure, doesn't use condoms. History  Of std's per chart review.     ROS per HPI, negative if not otherwise mentioned.      Past Medical History:  Diagnosis Date  . GERD (gastroesophageal reflux disease)   . Hx of chlamydia infection   . Hx of gonorrhea   . NSVD (normal spontaneous vaginal delivery) 11/23/2014  . Pregnancy induced hypertension    2013   . RUQ abdominal pain 02/2018  . Vaginal Pap smear, abnormal     Patient Active Problem List   Diagnosis Date Noted  . Chronic nausea 02/20/2018  . Hx of chlamydia infection   . GERD (gastroesophageal reflux disease)   . Acute with chronic cholecystitis s/p lap cholecystectomy 02/18/2018 02/18/2018  . Transaminitis 02/15/2018  . Supervision of normal pregnancy in third trimester 07/22/2017  . Mild tetrahydrocannabinol [facility](THC) abuse 04/13/2016    Past Surgical History:  Procedure Laterality Date  . CHOLECYSTECTOMY N/A 02/18/2018   Procedure: LAPAROSCOPIC CHOLECYSTECTOMY;  Surgeon: Berna Bueonnor, Chelsea A, MD;  Location: [facility] OR;  Service: General;  Laterality: N/A;  . NO PAST SURGERIES      OB History    Gravida  5   Para  5   Term  5   Preterm  0   AB  0   Living  5     SAB  0   TAB  0   Ectopic  0   Multiple  0   Live Births  5            Home Medications    Prior to Admission medications    Medication Sig Start Date End Date Taking? Authorizing Provider  fluconazole (DIFLUCAN) 150 MG tablet Take 1 tablet today. If still with symptoms may repeat in 3 days. 11/12/18   Georgetta HaberBurky, Jaquelyn Sakamoto B, NP  naproxen (NAPROSYN) 500 MG tablet Take 1 tablet (500 mg total) by mouth 2 (two) times daily with a meal. 02/20/18   Karie SodaGross, Steven, MD  ondansetron (ZOFRAN) 4 MG tablet Take 1 tablet (4 mg total) by mouth every 6 (six) hours as needed for nausea or vomiting. 02/20/18   Karie SodaGross, Steven, MD  oxyCODONE (OXY IR/ROXICODONE) 5 MG immediate release tablet Take 1-2 tablets (5-10 mg total) by mouth every 4 (four) hours as needed for moderate pain, severe pain or breakthrough pain. 02/20/18   Karie SodaGross, Steven, MD    Family History Family History  Problem Relation Age of Onset  . Healthy Mother   . Healthy Father   . Cancer Maternal Grandfather   . Asthma Brother   . Alcohol abuse Neg Hx   . Diabetes Neg Hx   . Heart disease Neg Hx   . Hypertension Neg Hx   . Stroke Neg Hx     Social History Social History   Tobacco Use  . Smoking status: Former Smoker  Types: Cigarettes  . Smokeless tobacco: Former NeurosurgeonUser    Quit date: 11/05/2015  . Tobacco comment: with + preg test  Substance Use Topics  . Alcohol use: Not Currently  . Drug use: Yes    Types: Marijuana    Comment: denies use of marajuana with current pregnancy     Allergies   Patient has no known allergies.   Review of Systems Review of Systems   Physical Exam Triage Vital Signs ED Triage Vitals  Enc Vitals Group     BP 11/12/18 1635 91/62     Pulse Rate 11/12/18 1635 92     Resp 11/12/18 1635 16     Temp 11/12/18 1635 98.1 F (36.7 C)     Temp Source 11/12/18 1635 Oral     SpO2 11/12/18 1635 100 %     Weight --      Height --      Head Circumference --      Peak Flow --      Pain Score 11/12/18 1633 0     Pain Loc --      Pain Edu? --      Excl. in GC? --    No data found.  Updated Vital Signs BP 91/62 (BP Location:  Left Arm)   Pulse 92   Temp 98.1 F (36.7 C) (Oral)   Resp 16   LMP 11/09/2018 (Exact Date)   SpO2 100%   Visual Acuity Right Eye Distance:   Left Eye Distance:   Bilateral Distance:    Right Eye Near:   Left Eye Near:    Bilateral Near:     Physical Exam Constitutional:      General: She is not in acute distress.    Appearance: She is well-developed.  Cardiovascular:     Rate and Rhythm: Normal rate.  Pulmonary:     Effort: Pulmonary effort is normal.  Abdominal:     Palpations: Abdomen is soft. Abdomen is not rigid.     Tenderness: There is no abdominal tenderness. There is no guarding or rebound.  Genitourinary:    Comments: Denies sores, lesions, vaginal bleeding; no pelvic pain; gu exam deferred at this time, vaginal self swab collected.   Skin:    General: Skin is warm and dry.  Neurological:     Mental Status: She is alert and oriented to person, place, and time.      UC Treatments / Results  Labs (all labs ordered are listed, but only abnormal results are displayed) Labs Reviewed - No data to display  EKG   Radiology No results found.  Procedures Procedures (including critical care time)  Medications Ordered in UC Medications - No data to display  Initial Impression / Assessment and Plan / UC Course  I have reviewed the triage vital signs and the nursing notes.  Pertinent labs & imaging results that were available during my care of the patient were reviewed by me and considered in my medical decision making (see chart for details).     Diflucan initiated with pending vaginal cytology. Will notify of any positive findings and if any changes to treatment are needed.  Patient verbalized understanding and agreeable to plan.    Final Clinical Impressions(s) / UC Diagnoses   Final diagnoses:  Acute vaginitis     Discharge Instructions     1 tab today. May repeat in three days if still with symptoms, does not need to be taken if your testing  is negative  for yeast.  Will notify you of any positive findings and if any changes to treatment are needed.   You may monitor your results on your MyChart online as well.     ED Prescriptions    Medication Sig Dispense Auth. Provider   fluconazole (DIFLUCAN) 150 MG tablet Take 1 tablet today. If still with symptoms may repeat in 3 days. 2 tablet Zigmund Gottron, NP     Controlled Substance Prescriptions [place] Controlled Substance Registry consulted? Not Applicable   Zigmund Gottron, NP 11/12/18 1655

## 2018-11-12 NOTE — Discharge Instructions (Signed)
1 tab today. May repeat in three days if still with symptoms, does not need to be taken if your testing is negative for yeast.  Will notify you of any positive findings and if any changes to treatment are needed.   You may monitor your results on your MyChart online as well.

## 2018-11-12 NOTE — ED Notes (Signed)
Cervicovaginal ancillary order completed and sent down to main lab for testing. TB

## 2018-11-12 NOTE — ED Triage Notes (Signed)
Patient presents to Urgent Care with complaints of possible yeast infection since about 2 weeks ago as well as brown discharge. Patient reports she has not tried anything otc for her symptoms, is at the end of her menstrual cycle.

## 2018-11-17 LAB — CERVICOVAGINAL ANCILLARY ONLY
Candida vaginitis: NEGATIVE
Chlamydia: NEGATIVE
Neisseria Gonorrhea: NEGATIVE
Trichomonas: NEGATIVE

## 2018-11-18 ENCOUNTER — Telehealth (HOSPITAL_COMMUNITY): Payer: Self-pay | Admitting: Emergency Medicine

## 2018-11-18 MED ORDER — METRONIDAZOLE 500 MG PO TABS: 500.0000 mg | ORAL_TABLET | Freq: Two times a day (BID) | ORAL | 0 refills | Status: AC

## 2018-11-18 NOTE — Telephone Encounter (Signed)
Bacterial vaginosis is positive. This was not treated at the urgent care visit.  Flagyl 500 mg BID x 7 days #14 no refills sent to patients pharmacy of choice.    Attempted to reach patient. Mother answered and stated she would have patient call me back.

## 2018-11-21 ENCOUNTER — Telehealth (HOSPITAL_COMMUNITY): Payer: Self-pay | Admitting: Emergency Medicine

## 2018-11-21 NOTE — Telephone Encounter (Signed)
Patient contacted and made aware of swab results, all questions answered.   

## 2019-01-23 ENCOUNTER — Other Ambulatory Visit: Payer: Self-pay | Admitting: *Deleted

## 2019-01-23 DIAGNOSIS — Z20822 Contact with and (suspected) exposure to covid-19: Secondary | ICD-10-CM

## 2019-01-25 LAB — NOVEL CORONAVIRUS, NAA: SARS-CoV-2, NAA: NOT DETECTED

## 2019-06-27 ENCOUNTER — Other Ambulatory Visit: Payer: Self-pay

## 2019-06-27 ENCOUNTER — Emergency Department (HOSPITAL_COMMUNITY)
Admission: EM | Admit: 2019-06-27 | Discharge: 2019-06-27 | Disposition: A | Discharge status: Home / Self Care | Payer: Self-pay | Attending: Emergency Medicine | Admitting: Emergency Medicine

## 2019-06-27 ENCOUNTER — Encounter (HOSPITAL_COMMUNITY): Payer: Self-pay

## 2019-06-27 DIAGNOSIS — Z3A01 Less than 8 weeks gestation of pregnancy: Secondary | ICD-10-CM | POA: Insufficient documentation

## 2019-06-27 DIAGNOSIS — O99891 Other specified diseases and conditions complicating pregnancy: Secondary | ICD-10-CM | POA: Insufficient documentation

## 2019-06-27 DIAGNOSIS — R519 Headache, unspecified: Secondary | ICD-10-CM | POA: Insufficient documentation

## 2019-06-27 LAB — URINALYSIS, ROUTINE W REFLEX MICROSCOPIC
Bilirubin Urine: NEGATIVE
Glucose, UA: NEGATIVE mg/dL
Hgb urine dipstick: NEGATIVE
Ketones, ur: NEGATIVE mg/dL
Nitrite: NEGATIVE
Protein, ur: NEGATIVE mg/dL
Specific Gravity, Urine: 1.027 (ref 1.005–1.030)
pH: 7 (ref 5.0–8.0)

## 2019-06-27 LAB — PREGNANCY, URINE: Preg Test, Ur: POSITIVE — AB

## 2019-06-27 NOTE — ED Provider Notes (Signed)
[facility]EM[facility]PT Provider Note   CSN: [id number] Arrival date & time: 06/27/19  1252     History Chief Complaint  Patient presents with  . late menstrual cycle    Bethany Gallagher is a 29 y.o. female.  29 y.o female G5P5 with a PMH of Gonorrhea, Chlamydia presents to the ED with a chief complaint of late menstrual cycle. Patient reports her LMP was 02/17, she reports feeling somewhat "different", has been having morning headaches with a relieved without intervention. She has taken two pregnancy tests while at home, reports one of them was positive. She reports no lower abdominal pain, no vaginal discharge, no urinary symptoms or other complaints.   The history is provided by the patient.       Past Medical History:  Diagnosis Date  . GERD (gastroesophageal reflux disease)   . Hx of chlamydia infection   . Hx of gonorrhea   . NSVD (normal spontaneous vaginal delivery) 11/23/2014  . Pregnancy induced hypertension    2013   . RUQ abdominal pain 02/2018  . Vaginal Pap smear, abnormal     Patient Active Problem List   Diagnosis Date Noted  . Chronic nausea 02/20/2018  . Hx of chlamydia infection   . GERD (gastroesophageal reflux disease)   . Acute with chronic cholecystitis s/p lap cholecystectomy 02/18/2018 02/18/2018  . Transaminitis 02/15/2018  . Supervision of normal pregnancy in third trimester 07/22/2017  . Mild tetrahydrocannabinol [facility]) abuse 04/13/2016    Past Surgical History:  Procedure Laterality Date  . CHOLECYSTECTOMY N/A 02/18/2018   Procedure: LAPAROSCOPIC CHOLECYSTECTOMY;  Surgeon: Berna Bue, MD;  Location: [facility];  Service: General;  Laterality: N/A;  . NO PAST SURGERIES       OB History    Gravida  5   Para  5   Term  5   Preterm  0   AB  0   Living  5     SAB  0   TAB  0   Ectopic  0   Multiple  0   Live Births  5           Family History  Problem Relation Age of Onset  . Healthy  Mother   . Healthy Father   . Cancer Maternal Grandfather   . Asthma Brother   . Alcohol abuse Neg Hx   . Diabetes Neg Hx   . Heart disease Neg Hx   . Hypertension Neg Hx   . Stroke Neg Hx     Social History   Tobacco Use  . Smoking status: Former Smoker    Types: Cigarettes  . Smokeless tobacco: Former Neurosurgeon    Quit date: 11/05/2015  . Tobacco comment: with + preg test  Substance Use Topics  . Alcohol use: Not Currently  . Drug use: Yes    Types: Marijuana    Comment: denies use of marajuana with current pregnancy    Home Medications Prior to Admission medications   Medication Sig Start Date End Date Taking? Authorizing Provider  fluconazole (DIFLUCAN) 150 MG tablet Take 1 tablet today. If still with symptoms may repeat in 3 days. 11/12/18   Georgetta Haber, NP  naproxen (NAPROSYN) 500 MG tablet Take 1 tablet (500 mg total) by mouth 2 (two) times daily with a meal. 02/20/18   Karie Soda, MD  ondansetron (ZOFRAN) 4 MG tablet Take 1 tablet (4 mg total) by mouth every 6 (six) hours as needed for nausea  or vomiting. 02/20/18   Karie Soda, MD  oxyCODONE (OXY IR/ROXICODONE) 5 MG immediate release tablet Take 1-2 tablets (5-10 mg total) by mouth every 4 (four) hours as needed for moderate pain, severe pain or breakthrough pain. 02/20/18   Karie Soda, MD    Allergies    Patient has no known allergies.  Review of Systems   Review of Systems  Constitutional: Negative for fever.  Respiratory: Negative for shortness of breath.   Genitourinary: Positive for menstrual problem.    Physical Exam Updated Vital Signs BP 110/68 (BP Location: Left Arm)   Pulse 76   Temp 98.6 F (37 C) (Oral)   Resp 14   Ht 5\' 6"  (1.676 m)   Wt 59 kg   LMP 05/24/2019 (Approximate)   SpO2 99%   BMI 20.98 kg/m   Physical Exam Vitals and nursing note reviewed.  Constitutional:      General: She is not in acute distress.    Appearance: She is well-developed.  HENT:     Head:  Normocephalic and atraumatic.     Mouth/Throat:     Pharynx: No oropharyngeal exudate.  Eyes:     Pupils: Pupils are equal, round, and reactive to light.  Cardiovascular:     Rate and Rhythm: Regular rhythm.     Heart sounds: Normal heart sounds.  Pulmonary:     Effort: Pulmonary effort is normal. No respiratory distress.     Breath sounds: Normal breath sounds.  Abdominal:     General: Bowel sounds are normal. There is no distension.     Palpations: Abdomen is soft.     Tenderness: There is no abdominal tenderness.  Musculoskeletal:        General: No tenderness or deformity.     Cervical back: Normal range of motion.     Right lower leg: No edema.     Left lower leg: No edema.  Skin:    General: Skin is warm and dry.  Neurological:     Mental Status: She is alert and oriented to person, place, and time.     ED Results / Procedures / Treatments   Labs (all labs ordered are listed, but only abnormal results are displayed) Labs Reviewed  URINALYSIS, ROUTINE W REFLEX MICROSCOPIC - Abnormal; Notable for the following components:      Result Value   Leukocytes,Ua TRACE (*)    Bacteria, UA RARE (*)    All other components within normal limits  PREGNANCY, URINE - Abnormal; Notable for the following components:   Preg Test, Ur POSITIVE (*)    All other components within normal limits  URINE CULTURE    EKG None  Radiology No results found.  Procedures Procedures (including critical care time)   Medications Ordered in ED Medications - No data to display  ED Course  I have reviewed the triage vital signs and the nursing notes.  Pertinent labs & imaging results that were available during my care of the patient were reviewed by me and considered in my medical decision making (see chart for details).  Clinical Course as of Jun 26 1417  Tue Jun 27, 2019  1405 Preg Test, Ur(!): POSITIVE [JS]    Clinical Course User Index [JS] 1406, PA-C   MDM  Rules/Calculators/A&P     Patient with a PMH of G&C G5P5 presents to the ED with a chief complaint of late menstrual cycle.  Reports taking 2 home pregnancy test, one of them which was positive.  Reports she has been feeling somewhat headaches in the morning, has been feeling "different".  Last menstrual period was February 17 of 2021, states she felt like she might be pregnant.  On today's visit she does have a positive pregnancy test.  She was previously followed by [facility], according to her last menstrual period patient is likely 4 weeks and 6 days pregnant.  These results were discussed with her.  She denies any abdominal pain, vaginal discharge, vaginal bleeding, other complaints.  UA did show some mild bacteria, this was sent off for culture.  She was advised to follow-up with GYN for further OB care.  She understands and agrees with management, provided with a letter for confirmation with [facility] for prior pregnancies.    Portions of this note were generated with Lobby[employer]. Dictation errors may occur despite best attempts at proofreading.  Final Clinical Impression(s) / ED Diagnoses Final diagnoses:  Less than [redacted] weeks gestation of pregnancy    Rx / DC Orders ED Discharge Orders    None       Janeece Fitting, PA-C 06/27/19 1419    Tegeler, Gwenyth Allegra, MD 06/27/19 [phone]

## 2019-06-27 NOTE — ED Triage Notes (Signed)
Patient states her last period was 05/24/19. Patient states she she had 2 home pregnancy tests (one was positive and the other was not).

## 2019-06-27 NOTE — Discharge Instructions (Addendum)
Please schedule an appointment with your OB/GYN in order to schedule your first OB care.  I have provided a letter for your confirmation of pregnancy.  According to your last menstrual period you are likely 4 weeks and 6 days pregnant.  If you experience any vaginal bleeding, abdominal pain, syncope please return to the ED.

## 2019-06-28 LAB — URINE CULTURE: Culture: 60000 — AB

## 2019-06-29 ENCOUNTER — Telehealth: Payer: Self-pay

## 2019-06-29 NOTE — Progress Notes (Signed)
ED Antimicrobial Stewardship Positive Culture Follow Up   Bethany Gallagher is an 29 y.o. female who presented to [facility] on 06/27/2019 with a chief complaint of  Chief Complaint  Patient presents with  . late menstrual cycle    Recent Results (from the past 720 hour(s))  Urine culture     Status: Abnormal   Collection Time: 06/27/19  1:15 PM   Specimen: Urine, Random  Result Value Ref Range Status   Specimen Description   Final    URINE, RANDOM Performed at [facility], [address], [place], [place] [address]    Special Requests   Final    NONE Performed at [facility], [address] [place], [place] [address]    Culture 60,000 COLONIES/mL VIRIDANS STREPTOCOCCUS (A)  Final   Report Status 06/28/2019 FINAL  Final   [x]  Patient discharged originally without antimicrobial agent and treatment is now indicated.  Patient is pregnant.  New antibiotic prescription: amoxicillin 500 mg PO TID for 5 days.  ED Provider: , MD   Virgina Norfolk 06/29/2019, 11:03 AM 4th Year PharmD Candidate [phone]

## 2019-07-03 ENCOUNTER — Ambulatory Visit: Payer: Self-pay | Admitting: Obstetrics

## 2019-07-15 ENCOUNTER — Emergency Department (HOSPITAL_BASED_OUTPATIENT_CLINIC_OR_DEPARTMENT_OTHER)
Admission: EM | Admit: 2019-07-15 | Discharge: 2019-07-15 | Disposition: A | Discharge status: Home / Self Care | Payer: Self-pay | Attending: Emergency Medicine | Admitting: Emergency Medicine

## 2019-07-15 ENCOUNTER — Other Ambulatory Visit: Payer: Self-pay

## 2019-07-15 ENCOUNTER — Emergency Department (HOSPITAL_COMMUNITY): Admission: EM | Admit: 2019-07-15 | Discharge: 2019-07-15 | Discharge status: Against Medical Advice | Payer: Self-pay

## 2019-07-15 ENCOUNTER — Encounter (HOSPITAL_BASED_OUTPATIENT_CLINIC_OR_DEPARTMENT_OTHER): Payer: Self-pay | Admitting: Emergency Medicine

## 2019-07-15 DIAGNOSIS — Z5321 Procedure and treatment not carried out due to patient leaving prior to being seen by health care provider: Secondary | ICD-10-CM | POA: Insufficient documentation

## 2019-07-15 DIAGNOSIS — M546 Pain in thoracic spine: Secondary | ICD-10-CM | POA: Insufficient documentation

## 2019-07-15 NOTE — ED Triage Notes (Signed)
L sided thoracic back pain x 2 days. Denies injury.

## 2019-07-16 ENCOUNTER — Emergency Department (HOSPITAL_COMMUNITY)
Admission: EM | Admit: 2019-07-16 | Discharge: 2019-07-16 | Disposition: A | Discharge status: Home / Self Care | Payer: Self-pay | Attending: Emergency Medicine | Admitting: Emergency Medicine

## 2019-07-16 ENCOUNTER — Encounter (HOSPITAL_COMMUNITY): Payer: Self-pay

## 2019-07-16 DIAGNOSIS — M546 Pain in thoracic spine: Secondary | ICD-10-CM | POA: Insufficient documentation

## 2019-07-16 DIAGNOSIS — Z87891 Personal history of nicotine dependence: Secondary | ICD-10-CM | POA: Insufficient documentation

## 2019-07-16 DIAGNOSIS — T148XXA Other injury of unspecified body region, initial encounter: Secondary | ICD-10-CM

## 2019-07-16 LAB — URINALYSIS, ROUTINE W REFLEX MICROSCOPIC
Bilirubin Urine: NEGATIVE
Glucose, UA: NEGATIVE mg/dL
Hgb urine dipstick: NEGATIVE
Ketones, ur: NEGATIVE mg/dL
Leukocytes,Ua: NEGATIVE
Nitrite: NEGATIVE
Protein, ur: NEGATIVE mg/dL
Specific Gravity, Urine: 1.019 (ref 1.005–1.030)
pH: 7 (ref 5.0–8.0)

## 2019-07-16 MED ORDER — NAPROXEN 375 MG PO TABS: 375.0000 mg | ORAL_TABLET | Freq: Two times a day (BID) | ORAL | 0 refills | Status: AC

## 2019-07-16 MED ORDER — METHOCARBAMOL 500 MG PO TABS: 500.0000 mg | ORAL_TABLET | Freq: Two times a day (BID) | ORAL | 0 refills | Status: AC

## 2019-07-16 NOTE — ED Provider Notes (Signed)
[facility]-EMERGENCY DEPT Provider Note   CSN: [id number] Arrival date & time: 07/16/19  0941     History Chief Complaint  Patient presents with  . Back Pain    Bethany Gallagher is a 29 y.o. female.  29 y.o female with a PMH of GERD, THC presents to the ED with a chief complaint of thoracic back pain x 3 days. She describes this as a pulling sensation to the left side of her back, the pain is constant and exacerbates with movement along with twisting. She reports taking advil for the pain without improvement in symptoms. Attempted to be evaluated yesterday however due to long wait times went home.  She denies any fever, urinary symptoms, gynecological complaints or abdominal pain.  The history is provided by the patient and medical records.  Back Pain Associated symptoms: no fever        Past Medical History:  Diagnosis Date  . GERD (gastroesophageal reflux disease)   . Hx of chlamydia infection   . Hx of gonorrhea   . NSVD (normal spontaneous vaginal delivery) 11/23/2014  . Pregnancy induced hypertension    2013   . RUQ abdominal pain 02/2018  . Vaginal Pap smear, abnormal     Patient Active Problem List   Diagnosis Date Noted  . Chronic nausea 02/20/2018  . Hx of chlamydia infection   . GERD (gastroesophageal reflux disease)   . Acute with chronic cholecystitis s/p lap cholecystectomy 02/18/2018 02/18/2018  . Transaminitis 02/15/2018  . Supervision of normal pregnancy in third trimester 07/22/2017  . Mild tetrahydrocannabinol [facility]) abuse 04/13/2016    Past Surgical History:  Procedure Laterality Date  . CHOLECYSTECTOMY N/A 02/18/2018   Procedure: LAPAROSCOPIC CHOLECYSTECTOMY;  Surgeon: Berna Bue, MD;  Location: [facility] OR;  Service: General;  Laterality: N/A;  . NO PAST SURGERIES       OB History    Gravida  5   Para  5   Term  5   Preterm  0   AB  0   Living  5     SAB  0   TAB  0   Ectopic  0   Multiple  0   Live Births  5           Family History  Problem Relation Age of Onset  . Healthy Mother   . Healthy Father   . Cancer Maternal Grandfather   . Asthma Brother   . Alcohol abuse Neg Hx   . Diabetes Neg Hx   . Heart disease Neg Hx   . Hypertension Neg Hx   . Stroke Neg Hx     Social History   Tobacco Use  . Smoking status: Former Smoker    Types: Cigarettes  . Smokeless tobacco: Former Neurosurgeon    Quit date: 11/05/2015  . Tobacco comment: with + preg test  Substance Use Topics  . Alcohol use: Not Currently  . Drug use: Yes    Types: Marijuana    Comment: denies use of marajuana with current pregnancy    Home Medications Prior to Admission medications   Medication Sig Start Date End Date Taking? Authorizing Provider  fluconazole (DIFLUCAN) 150 MG tablet Take 1 tablet today. If still with symptoms may repeat in 3 days. 11/12/18   Georgetta Haber, NP  methocarbamol (ROBAXIN) 500 MG tablet Take 1 tablet (500 mg total) by mouth 2 (two) times daily for 7 days. 07/16/19 07/23/19  Claude Manges, PA-C  naproxen (  NAPROSYN) 375 MG tablet Take 1 tablet (375 mg total) by mouth 2 (two) times daily for 7 days. 07/16/19 07/23/19  Janeece Fitting, PA-C  ondansetron (ZOFRAN) 4 MG tablet Take 1 tablet (4 mg total) by mouth every 6 (six) hours as needed for nausea or vomiting. 02/20/18   Michael Boston, MD  oxyCODONE (OXY IR/ROXICODONE) 5 MG immediate release tablet Take 1-2 tablets (5-10 mg total) by mouth every 4 (four) hours as needed for moderate pain, severe pain or breakthrough pain. 02/20/18   Michael Boston, MD    Allergies    Patient has no known allergies.  Review of Systems   Review of Systems  Constitutional: Negative for fever.  Genitourinary: Negative for flank pain.  Musculoskeletal: Positive for back pain and myalgias.    Physical Exam Updated Vital Signs BP 107/87 (BP Location: Right Arm)   Pulse 91   Temp 98 F (36.7 C) (Oral)   Resp 16   SpO2 99%   Physical Exam Vitals  and nursing note reviewed.  Constitutional:      General: She is not in acute distress.    Appearance: She is well-developed.  HENT:     Head: Normocephalic and atraumatic.     Mouth/Throat:     Pharynx: No oropharyngeal exudate.  Eyes:     Pupils: Pupils are equal, round, and reactive to light.  Cardiovascular:     Rate and Rhythm: Regular rhythm.     Heart sounds: Normal heart sounds.  Pulmonary:     Effort: Pulmonary effort is normal. No respiratory distress.     Breath sounds: Normal breath sounds.  Abdominal:     General: Bowel sounds are normal. There is no distension.     Palpations: Abdomen is soft.     Tenderness: There is no abdominal tenderness.  Musculoskeletal:        General: No deformity.     Cervical back: Normal range of motion.     Thoracic back: Tenderness present. No spasms or bony tenderness.       Back:     Right lower leg: No edema.     Left lower leg: No edema.     Comments: No midline tenderness, no visualize muscle spams, no swelling or edema noted.   Skin:    General: Skin is warm and dry.  Neurological:     Mental Status: She is alert and oriented to person, place, and time.     ED Results / Procedures / Treatments   Labs (all labs ordered are listed, but only abnormal results are displayed) Labs Reviewed  URINALYSIS, ROUTINE W REFLEX MICROSCOPIC    EKG None  Radiology No results found.  Procedures Procedures (including critical care time)  Medications Ordered in ED Medications - No data to display  ED Course  I have reviewed the triage vital signs and the nursing notes.  Pertinent labs & imaging results that were available during my care of the patient were reviewed by me and considered in my medical decision making (see chart for details).    MDM Rules/Calculators/A&P  Patient with no pertinent past medical history presents to the ED with complaints of left-sided back pain which is worse with movement and twisting which  began 3 days ago.  Reports no injury, was pregnant during her last ED visit however now reports she has had an abortion.  On today's evaluation she is overall well-appearing, vitals are within normal limits, she is afebrile.  Does have pain with palpation  along the left thoracic area. No CVA tenderness.  Last them she was seen in the ED, urine culture did grow greater than 60 [place], she is currently not having urinary symptoms but does report some clumpy discharge.  UA has been obtained on today's visit to rule out any urinary tract infection, this was negative for nitrites, leukocytes, bacteria.  We discussed management of pain for likely muscle spasm.  Pain is exacerbated with movement along with twisting at the waist.  Is currently not having any vaginal bleeding, gynecological complaints. She will go home on a short course of anti-inflammatories along with muscle relaxers to help with pain.  PCP follow-up encouraged.  Return precautions reviewed at length.   Portions of this note were generated with Scientist, clinical (histocompatibility and immunogenetics). Dictation errors may occur despite best attempts at proofreading.  Final Clinical Impression(s) / ED Diagnoses Final diagnoses:  Muscle strain    Rx / DC Orders ED Discharge Orders         Ordered    methocarbamol (ROBAXIN) 500 MG tablet  2 times daily     07/16/19 1212    naproxen (NAPROSYN) 375 MG tablet  2 times daily     07/16/19 [address], PA-C 07/16/19 1225    Bethann Berkshire, MD 07/17/19 1035

## 2019-07-16 NOTE — ED Triage Notes (Signed)
Pt presents with c/o back pain that started 3 days ago. Pt denies any recent injury. Pt reports the pain is in the top of her back.

## 2019-07-16 NOTE — Discharge Instructions (Signed)
Your urinary results were within normal limits.  I have prescribed a short course of muscle relaxers to help with your likely muscle strain, please take 1 tablet twice a day for the next 7 days.  Please be aware this medication can make you drowsy, do not drink alcohol or drive while taking this medication.  The second medication is anti-inflammatory, please take this for symptomatic control.

## 2019-10-16 DIAGNOSIS — Z5181 Encounter for therapeutic drug level monitoring: Secondary | ICD-10-CM | POA: Diagnosis not present

## 2019-10-18 DIAGNOSIS — Z5181 Encounter for therapeutic drug level monitoring: Secondary | ICD-10-CM | POA: Diagnosis not present

## 2019-10-23 DIAGNOSIS — Z5181 Encounter for therapeutic drug level monitoring: Secondary | ICD-10-CM | POA: Diagnosis not present

## 2019-10-25 DIAGNOSIS — Z5181 Encounter for therapeutic drug level monitoring: Secondary | ICD-10-CM | POA: Diagnosis not present

## 2019-10-30 DIAGNOSIS — Z5181 Encounter for therapeutic drug level monitoring: Secondary | ICD-10-CM | POA: Diagnosis not present

## 2019-11-01 DIAGNOSIS — Z5181 Encounter for therapeutic drug level monitoring: Secondary | ICD-10-CM | POA: Diagnosis not present

## 2019-11-06 DIAGNOSIS — Z5181 Encounter for therapeutic drug level monitoring: Secondary | ICD-10-CM | POA: Diagnosis not present

## 2019-11-08 ENCOUNTER — Ambulatory Visit: Payer: Self-pay | Admitting: Obstetrics

## 2019-11-08 DIAGNOSIS — Z5181 Encounter for therapeutic drug level monitoring: Secondary | ICD-10-CM | POA: Diagnosis not present

## 2019-11-13 DIAGNOSIS — Z5181 Encounter for therapeutic drug level monitoring: Secondary | ICD-10-CM | POA: Diagnosis not present

## 2019-11-15 DIAGNOSIS — Z5181 Encounter for therapeutic drug level monitoring: Secondary | ICD-10-CM | POA: Diagnosis not present

## 2019-11-20 DIAGNOSIS — Z5181 Encounter for therapeutic drug level monitoring: Secondary | ICD-10-CM | POA: Diagnosis not present

## 2019-11-22 DIAGNOSIS — Z5181 Encounter for therapeutic drug level monitoring: Secondary | ICD-10-CM | POA: Diagnosis not present

## 2019-11-27 DIAGNOSIS — Z5181 Encounter for therapeutic drug level monitoring: Secondary | ICD-10-CM | POA: Diagnosis not present

## 2019-11-29 DIAGNOSIS — Z5181 Encounter for therapeutic drug level monitoring: Secondary | ICD-10-CM | POA: Diagnosis not present

## 2019-12-03 IMAGING — US US MFM OB DETAIL+14 WK
1 series · 14 of 28 positions shown · non-contrast
Comparison: none

[Series 1: us mfm ob detail+14 wk · 82 acquisitions, 14 frames shown]
[im 4/82]
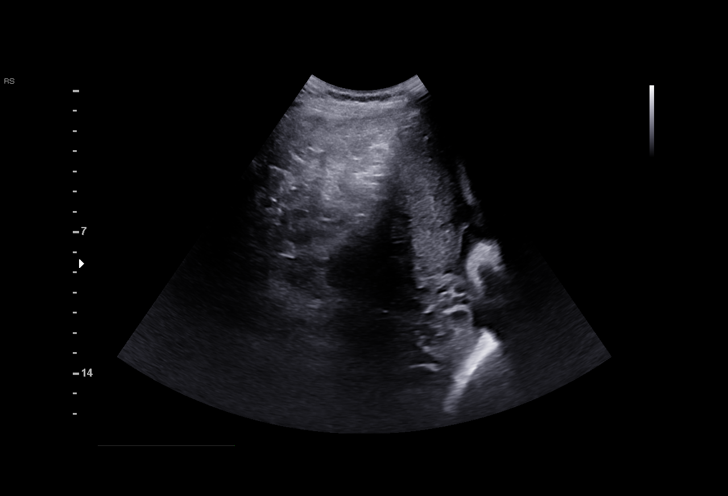
[im 10/82]
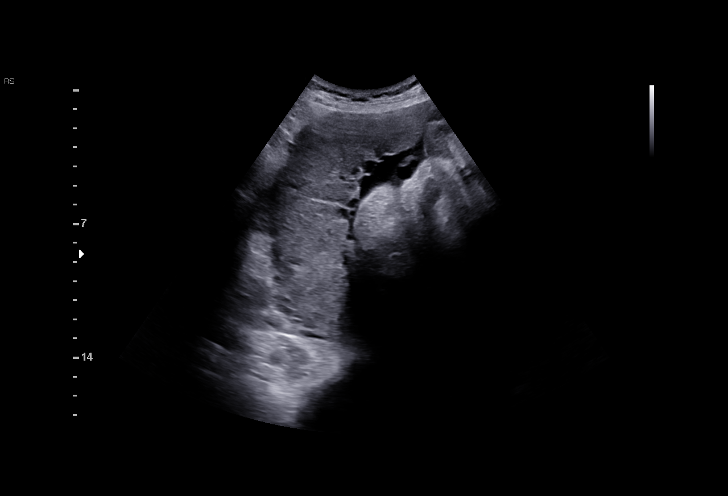
[im 16/82]
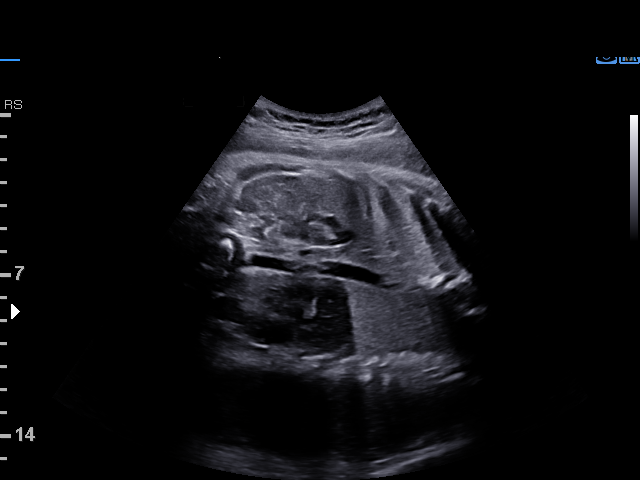
[im 22/82]
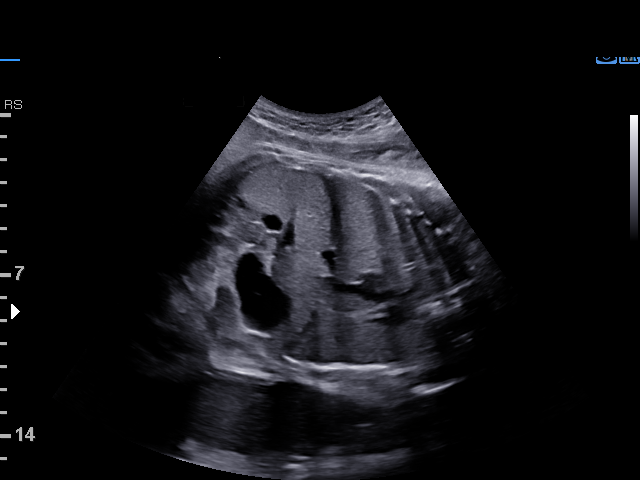
[im 28/82]
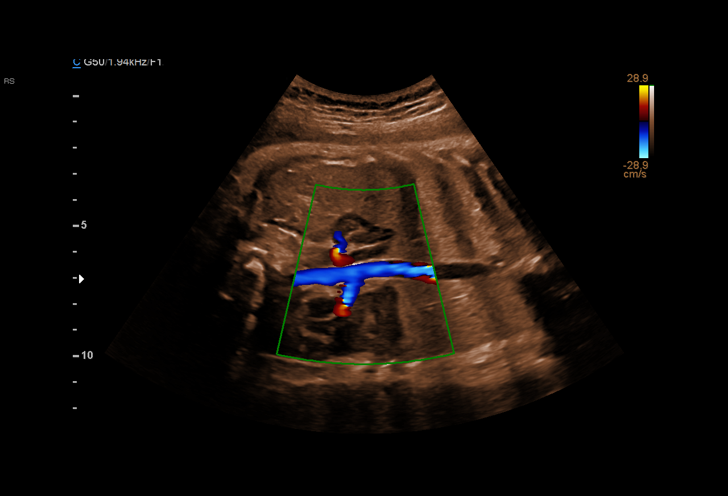
[im 34/82]
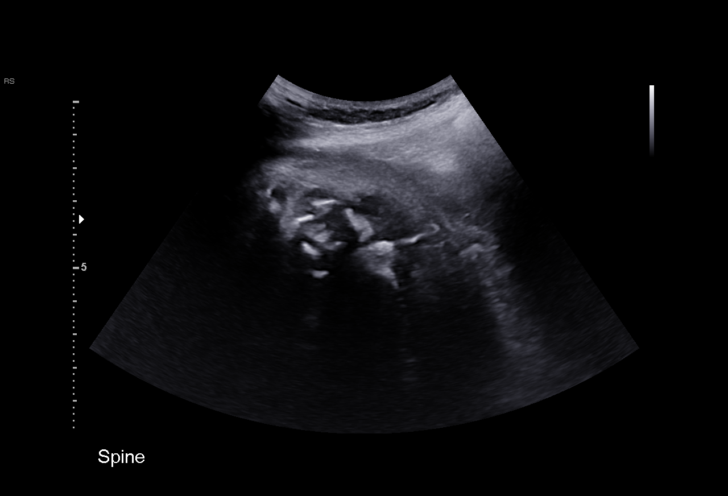
[im 40/82]
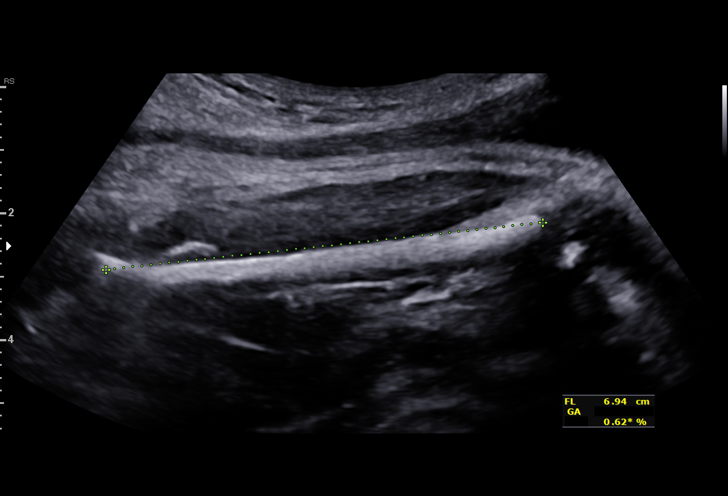
[im 46/82]
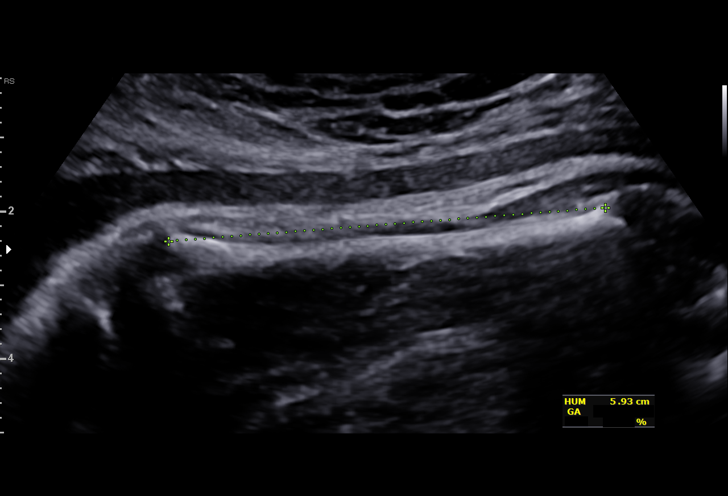
[im 52/82]
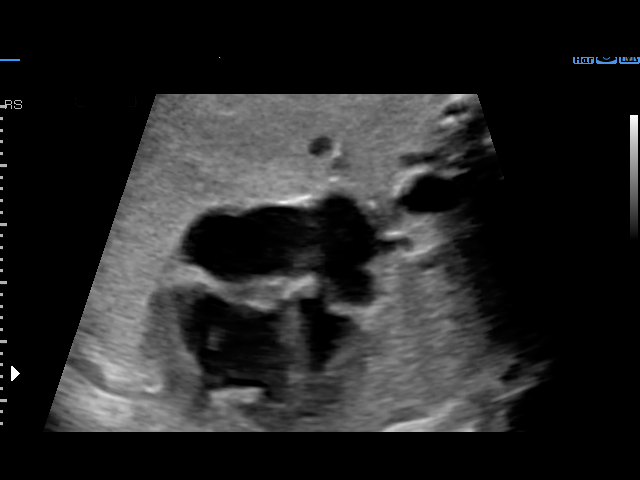
[im 58/82]
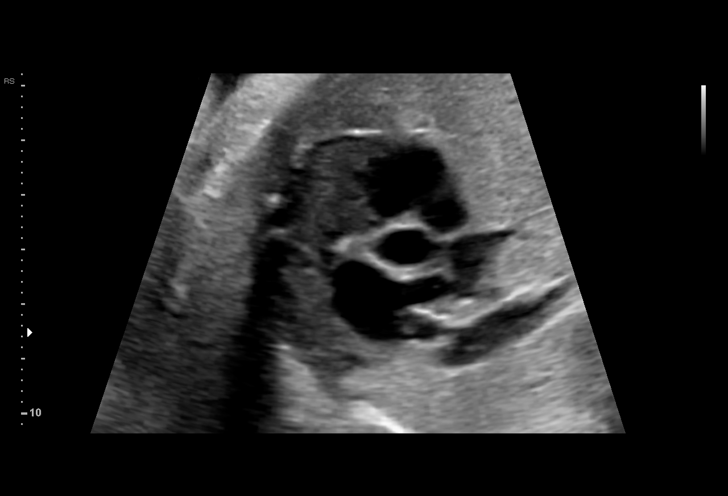
[im 64/82]
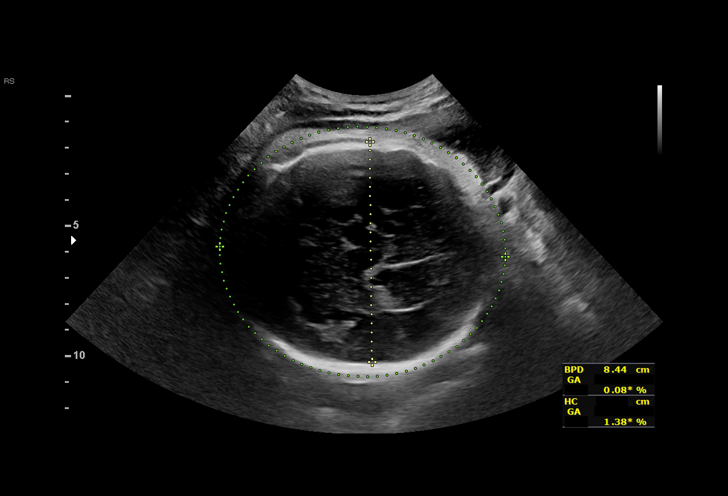
[im 70/82]
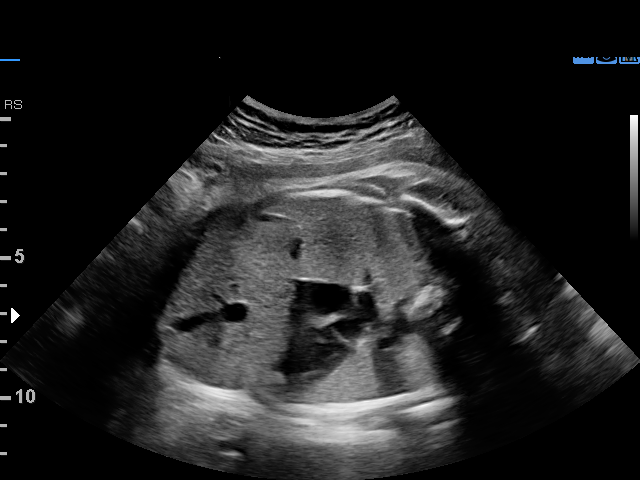
[im 76/82]
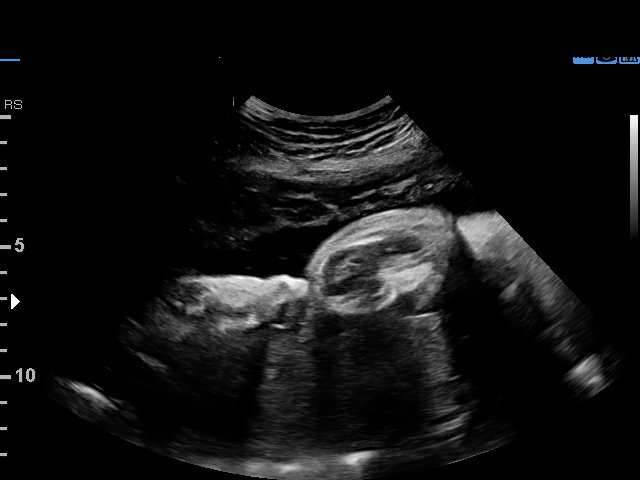
[im 82/82]
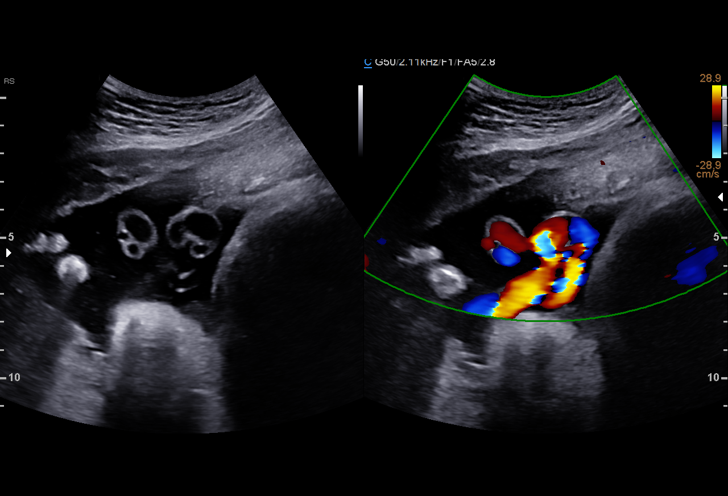

[14 of 28 positions shown; findings below may reference images not displayed]

1  DONGWAN MONGOLIAN           [id number]      [id number]     [id number]
Indications

39 weeks gestation of pregnancy
Late to prenatal care, third trimester
Poor obstetric history: Previous
preeclampsia / eclampsia/gestational HTN
Encounter for antenatal screening for
malformations
OB History

Gravidity:    5         Term:   4
Living:       4
Fetal Evaluation

Num Of Fetuses:     1
Fetal Heart         149
Rate(bpm):
Cardiac Activity:   Observed
Presentation:       Cephalic
Placenta:           Posterior Fundal, above cervical os
P. Cord Insertion:  Visualized

Amniotic Fluid
AFI FV:      Subjectively within normal limits

AFI Sum(cm)     %Tile       Largest Pocket(cm)
8.88            20

RUQ(cm)                     LUQ(cm)        LLQ(cm)
3.64
Biometry

BPD:      87.7  mm     G. Age:  35w 3d          2  %    CI:        74.75   %    70 - 86
FL/HC:       21.5  %    20.7 -
HC:      321.9  mm     G. Age:  36w 3d        < 3  %    HC/AC:       0.94       0.87 -
AC:      341.2  mm     G. Age:  38w 0d         28  %    FL/BPD:      78.9  %    71 - 87
FL:       69.2  mm     G. Age:  35w 4d        < 3  %    FL/AC:       20.3  %    20 - 24
HUM:      59.7  mm     G. Age:  34w 5d        < 5  %

Est. FW:    9489   gm    6 lb 12 oz     34  %
Gestational Age

LMP:           39w 5d        Date:  10/25/16                 EDD:   08/01/17
U/S Today:     36w 3d                                        EDD:   08/24/17
Best:          39w 5d     Det. By:  LMP  (10/25/16)          EDD:   08/01/17
Anatomy

Cranium:               Appears normal         Aortic Arch:            Appears normal
Cavum:                 Not well visualized    Ductal Arch:            Not well visualized
Ventricles:            Appears normal         Diaphragm:              Appears normal
Choroid Plexus:        Not well visualized    Stomach:                Appears normal, left
sided
Cerebellum:            Not well visualized    Abdomen:                Appears normal
Posterior Fossa:       Not well visualized    Abdominal Wall:         Not well visualized
Nuchal Fold:           Not applicable (>20    Cord Vessels:           Appears normal (3
wks GA)                                        vessel cord)
Face:                  Not well visualized    Kidneys:                Appear normal
Lips:                  Not well visualized    Bladder:                Appears normal
Thoracic:              Appears normal         Spine:                  Not well visualized
Heart:                 Appears normal         Upper Extremities:      Visualized
(4CH, axis, and
situs)
RVOT:                  Appears normal         Lower Extremities:      Visualized
LVOT:                  Appears normal

Other:  Male gender. Technically difficult due to advanced GA and fetal
position.
Cervix Uterus Adnexa

Cervix
Not visualized (advanced GA >86wks)

Uterus
No abnormality visualized.

Left Ovary
Not visualized.

Right Ovary
Not visualized.

Adnexa:       No abnormality visualized. No adnexal mass
visualized.
Impression

SIUP at 39+5 weeks
Cephalic presentation
Normal but very limited detailed fetal anatomy; no gross
abnormalities identified
Normal amniotic fluid volume
Measurements consistent with LMP dating; EFW at the 34th
%tile
Recommendations

Follow-up as clinically indicated

## 2019-12-04 DIAGNOSIS — Z5181 Encounter for therapeutic drug level monitoring: Secondary | ICD-10-CM | POA: Diagnosis not present

## 2019-12-06 DIAGNOSIS — Z5181 Encounter for therapeutic drug level monitoring: Secondary | ICD-10-CM | POA: Diagnosis not present

## 2019-12-07 DIAGNOSIS — Z5181 Encounter for therapeutic drug level monitoring: Secondary | ICD-10-CM | POA: Diagnosis not present

## 2019-12-09 DIAGNOSIS — Z5181 Encounter for therapeutic drug level monitoring: Secondary | ICD-10-CM | POA: Diagnosis not present

## 2019-12-11 DIAGNOSIS — Z5181 Encounter for therapeutic drug level monitoring: Secondary | ICD-10-CM | POA: Diagnosis not present

## 2019-12-13 DIAGNOSIS — Z5181 Encounter for therapeutic drug level monitoring: Secondary | ICD-10-CM | POA: Diagnosis not present

## 2019-12-15 DIAGNOSIS — Z5181 Encounter for therapeutic drug level monitoring: Secondary | ICD-10-CM | POA: Diagnosis not present

## 2019-12-17 DIAGNOSIS — Z5181 Encounter for therapeutic drug level monitoring: Secondary | ICD-10-CM | POA: Diagnosis not present

## 2019-12-19 ENCOUNTER — Ambulatory Visit: Payer: Self-pay | Admitting: Obstetrics

## 2019-12-19 DIAGNOSIS — U071 COVID-19: Secondary | ICD-10-CM | POA: Diagnosis not present

## 2019-12-19 DIAGNOSIS — Z20828 Contact with and (suspected) exposure to other viral communicable diseases: Secondary | ICD-10-CM | POA: Diagnosis not present

## 2019-12-19 DIAGNOSIS — B974 Respiratory syncytial virus as the cause of diseases classified elsewhere: Secondary | ICD-10-CM | POA: Diagnosis not present

## 2019-12-19 DIAGNOSIS — Z5181 Encounter for therapeutic drug level monitoring: Secondary | ICD-10-CM | POA: Diagnosis not present

## 2019-12-19 DIAGNOSIS — J101 Influenza due to other identified influenza virus with other respiratory manifestations: Secondary | ICD-10-CM | POA: Diagnosis not present

## 2019-12-21 DIAGNOSIS — Z5181 Encounter for therapeutic drug level monitoring: Secondary | ICD-10-CM | POA: Diagnosis not present

## 2019-12-23 DIAGNOSIS — Z5181 Encounter for therapeutic drug level monitoring: Secondary | ICD-10-CM | POA: Diagnosis not present

## 2019-12-25 DIAGNOSIS — Y92410 Unspecified street and highway as the place of occurrence of the external cause: Secondary | ICD-10-CM | POA: Diagnosis not present

## 2019-12-25 DIAGNOSIS — S60411A Abrasion of left index finger, initial encounter: Secondary | ICD-10-CM | POA: Diagnosis not present

## 2019-12-25 DIAGNOSIS — M25539 Pain in unspecified wrist: Secondary | ICD-10-CM | POA: Diagnosis not present

## 2019-12-25 DIAGNOSIS — M25561 Pain in right knee: Secondary | ICD-10-CM | POA: Diagnosis not present

## 2019-12-25 DIAGNOSIS — M25532 Pain in left wrist: Secondary | ICD-10-CM | POA: Diagnosis not present

## 2019-12-25 DIAGNOSIS — R52 Pain, unspecified: Secondary | ICD-10-CM | POA: Diagnosis not present

## 2019-12-25 DIAGNOSIS — Y999 Unspecified external cause status: Secondary | ICD-10-CM | POA: Diagnosis not present

## 2019-12-25 DIAGNOSIS — Z041 Encounter for examination and observation following transport accident: Secondary | ICD-10-CM | POA: Diagnosis not present

## 2019-12-25 DIAGNOSIS — M79642 Pain in left hand: Secondary | ICD-10-CM | POA: Diagnosis not present

## 2020-03-01 ENCOUNTER — Other Ambulatory Visit: Payer: Self-pay

## 2020-03-01 ENCOUNTER — Encounter (HOSPITAL_BASED_OUTPATIENT_CLINIC_OR_DEPARTMENT_OTHER): Payer: Self-pay | Admitting: *Deleted

## 2020-03-01 ENCOUNTER — Emergency Department (HOSPITAL_BASED_OUTPATIENT_CLINIC_OR_DEPARTMENT_OTHER)
Admission: EM | Admit: 2020-03-01 | Discharge: 2020-03-02 | Disposition: A | Discharge status: Home / Self Care | Payer: Medicaid Other | Attending: Emergency Medicine | Admitting: Emergency Medicine

## 2020-03-01 DIAGNOSIS — R112 Nausea with vomiting, unspecified: Secondary | ICD-10-CM

## 2020-03-01 DIAGNOSIS — Z3491 Encounter for supervision of normal pregnancy, unspecified, first trimester: Secondary | ICD-10-CM

## 2020-03-01 DIAGNOSIS — Z3A01 Less than 8 weeks gestation of pregnancy: Secondary | ICD-10-CM | POA: Diagnosis not present

## 2020-03-01 DIAGNOSIS — Z87891 Personal history of nicotine dependence: Secondary | ICD-10-CM | POA: Diagnosis not present

## 2020-03-01 DIAGNOSIS — Z20822 Contact with and (suspected) exposure to covid-19: Secondary | ICD-10-CM | POA: Insufficient documentation

## 2020-03-01 DIAGNOSIS — O219 Vomiting of pregnancy, unspecified: Secondary | ICD-10-CM | POA: Diagnosis not present

## 2020-03-01 LAB — COMPREHENSIVE METABOLIC PANEL
ALT: 20 U/L (ref 0–44)
AST: 22 U/L (ref 15–41)
Albumin: 4.7 g/dL (ref 3.5–5.0)
Alkaline Phosphatase: 58 U/L (ref 38–126)
Anion gap: 12 (ref 5–15)
BUN: 13 mg/dL (ref 6–20)
CO2: 22 mmol/L (ref 22–32)
Calcium: 10 mg/dL (ref 8.9–10.3)
Chloride: 100 mmol/L (ref 98–111)
Creatinine, Ser: 0.8 mg/dL (ref 0.44–1.00)
GFR, Estimated: >60 mL/min (ref >60)
Glucose, Bld: 91 mg/dL (ref 70–99)
Potassium: 3.6 mmol/L (ref 3.5–5.1)
Sodium: 134 mmol/L — ABNORMAL LOW (ref 135–145)
Total Bilirubin: 1 mg/dL (ref 0.3–1.2)
Total Protein: 8.6 g/dL — ABNORMAL HIGH (ref 6.5–8.1)

## 2020-03-01 LAB — URINALYSIS, ROUTINE W REFLEX MICROSCOPIC
Bilirubin Urine: NEGATIVE
Glucose, UA: NEGATIVE mg/dL
Ketones, ur: >80 mg/dL — AB
Nitrite: NEGATIVE
Protein, ur: NEGATIVE mg/dL
Specific Gravity, Urine: >1.03 — ABNORMAL HIGH (ref 1.005–1.030)
pH: 6 (ref 5.0–8.0)

## 2020-03-01 LAB — CBC WITH DIFFERENTIAL/PLATELET
Abs Immature Granulocytes: 0.01 10*3/uL (ref 0.00–0.07)
Basophils Absolute: 0 10*3/uL (ref 0.0–0.1)
Basophils Relative: 0 %
Eosinophils Absolute: 0 10*3/uL (ref 0.0–0.5)
Eosinophils Relative: 0 %
HCT: 38.2 % (ref 36.0–46.0)
Hemoglobin: 14 g/dL (ref 12.0–15.0)
Immature Granulocytes: 0 %
Lymphocytes Relative: 30 %
Lymphs Abs: 1.5 10*3/uL (ref 0.7–4.0)
MCH: 32.3 pg (ref 26.0–34.0)
MCHC: 36.6 g/dL — ABNORMAL HIGH (ref 30.0–36.0)
MCV: 88 fL (ref 80.0–100.0)
Monocytes Absolute: 0.4 10*3/uL (ref 0.1–1.0)
Monocytes Relative: 8 %
Neutro Abs: 3.2 10*3/uL (ref 1.7–7.7)
Neutrophils Relative %: 62 %
Platelets: 330 10*3/uL (ref 150–400)
RBC: 4.34 MIL/uL (ref 3.87–5.11)
RDW: 11.4 % — ABNORMAL LOW (ref 11.5–15.5)
WBC: 5.2 10*3/uL (ref 4.0–10.5)
nRBC: 0 % (ref 0.0–0.2)

## 2020-03-01 LAB — URINALYSIS, MICROSCOPIC (REFLEX): WBC, UA: >50 WBC/hpf (ref 0–5)

## 2020-03-01 LAB — RESP PANEL BY RT-PCR (FLU A&B, COVID) ARPGX2
Influenza A by PCR: NEGATIVE
Influenza B by PCR: NEGATIVE
SARS Coronavirus 2 by RT PCR: NEGATIVE

## 2020-03-01 LAB — LIPASE, BLOOD: Lipase: 20 U/L (ref 11–51)

## 2020-03-01 LAB — PREGNANCY, URINE: Preg Test, Ur: POSITIVE — AB

## 2020-03-01 LAB — HCG, QUANTITATIVE, PREGNANCY: hCG, Beta Chain, Quant, S: 36885 m[IU]/mL — ABNORMAL HIGH (ref <5)

## 2020-03-01 MED ORDER — SODIUM CHLORIDE 0.9 % IV SOLN
1.0000 g | Freq: Once | INTRAVENOUS | Status: AC
  Administered 2020-03-01: 1 g via INTRAVENOUS
  Filled 2020-03-01: qty 10

## 2020-03-01 MED ORDER — LACTATED RINGERS IV BOLUS
2000.0000 mL | Freq: Once | INTRAVENOUS | Status: AC
  Administered 2020-03-01: 2000 mL via INTRAVENOUS

## 2020-03-01 MED ORDER — FAMOTIDINE IN NACL 20-0.9 MG/50ML-% IV SOLN
20.0000 mg | Freq: Once | INTRAVENOUS | Status: AC
  Administered 2020-03-01: 20 mg via INTRAVENOUS
  Filled 2020-03-01: qty 50

## 2020-03-01 MED ORDER — FAMOTIDINE 20 MG PO TABS: 20.0000 mg | ORAL_TABLET | Freq: Two times a day (BID) | ORAL | 0 refills | Status: DC

## 2020-03-01 MED ORDER — SODIUM CHLORIDE 0.9 % IV SOLN
INTRAVENOUS | Status: DC | PRN
  Administered 2020-03-01: 500 mL via INTRAVENOUS

## 2020-03-01 MED ORDER — CEPHALEXIN 500 MG PO CAPS: 1000.0000 mg | ORAL_CAPSULE | Freq: Two times a day (BID) | ORAL | 0 refills | Status: DC

## 2020-03-01 MED ORDER — ONDANSETRON HCL 4 MG/2ML IJ SOLN
4.0000 mg | Freq: Once | INTRAMUSCULAR | Status: AC
  Administered 2020-03-01: 4 mg via INTRAVENOUS
  Filled 2020-03-01: qty 2

## 2020-03-01 MED ORDER — ONDANSETRON 4 MG PO TBDP: 4.0000 mg | ORAL_TABLET | ORAL | 0 refills | Status: DC | PRN

## 2020-03-01 NOTE — Discharge Instructions (Addendum)
1. Call your OB doctor on Monday to schedule your prenatal appointment. All instructions for prenatal care in your discharge instructions. 2. Start taking Pepcid twice daily. 3. Try to eat small, bland meals frequently to avoid nausea and vomiting. Nauseated and vomiting you may take a Zofran tablet if needed. 4. Your urinalysis suggest urinary tract infection. Take antibiotics until the urine culture is completed.

## 2020-03-01 NOTE — ED Triage Notes (Signed)
Vomiting x 3 days. She had a positive home pregnancy test 4 days ago. LMP 10/19.

## 2020-03-01 NOTE — ED Provider Notes (Signed)
[facility] EMERGENCY DEPARTMENT Provider Note   CSN: [id number] Arrival date & time: 03/01/20  1749     History Chief Complaint  Patient presents with  . Abdominal Pain  . Emesis    Bethany Gallagher is a 29 y.o. female.  HPI The patient reports that she is [redacted] weeks pregnant estimated by her LMP.  Patient reports this is 6th pregnancy for her so she knows how to do her dates.  Reports she did take a positive home pregnancy test.  Patient reports she has had nausea and vomiting for 3 days.  She reports she is not able to eat anything 3 days.  She reports she has kept fluids and keeps drinking despite vomiting so she does not get dehydrated.  Reports she continues to make urine she has not had pain or burning.  No abnormal vaginal discharge or bleeding.  She denies abdominal pain.  She reports she gets a kind of tightening just before she vomits but otherwise is not having any pain.  She reports she has experienced this with prior pregnancies.  No fevers, no body aches, no diarrhea.  She reports has been slightly constipated.  Patient is not immunized for Covid.    Past Medical History:  Diagnosis Date  . GERD (gastroesophageal reflux disease)   . Hx of chlamydia infection   . Hx of gonorrhea   . NSVD (normal spontaneous vaginal delivery) 11/23/2014  . Pregnancy induced hypertension    2013   . RUQ abdominal pain 02/2018  . Vaginal Pap smear, abnormal     Patient Active Problem List   Diagnosis Date Noted  . Chronic nausea 02/20/2018  . Hx of chlamydia infection   . GERD (gastroesophageal reflux disease)   . Acute with chronic cholecystitis s/p lap cholecystectomy 02/18/2018 02/18/2018  . Transaminitis 02/15/2018  . Supervision of normal pregnancy in third trimester 07/22/2017  . Mild tetrahydrocannabinol [facility]) abuse 04/13/2016    Past Surgical History:  Procedure Laterality Date  . CHOLECYSTECTOMY N/A 02/18/2018   Procedure: LAPAROSCOPIC CHOLECYSTECTOMY;   Surgeon: Berna Bue, MD;  Location: [facility] OR;  Service: General;  Laterality: N/A;  . NO PAST SURGERIES       OB History    Gravida  5   Para  5   Term  5   Preterm  0   AB  0   Living  5     SAB  0   TAB  0   Ectopic  0   Multiple  0   Live Births  5           Family History  Problem Relation Age of Onset  . Healthy Mother   . Healthy Father   . Cancer Maternal Grandfather   . Asthma Brother   . Alcohol abuse Neg Hx   . Diabetes Neg Hx   . Heart disease Neg Hx   . Hypertension Neg Hx   . Stroke Neg Hx     Social History   Tobacco Use  . Smoking status: Former Smoker    Types: Cigarettes  . Smokeless tobacco: Former Neurosurgeon    Quit date: 11/05/2015  . Tobacco comment: with + preg test  Vaping Use  . Vaping Use: Never used  Substance Use Topics  . Alcohol use: Not Currently  . Drug use: Yes    Types: Marijuana    Comment: denies use of marajuana with current pregnancy    Home Medications Prior to Admission  medications   Medication Sig Start Date End Date Taking? Authorizing Provider  cephALEXin (KEFLEX) 500 MG capsule Take 2 capsules (1,000 mg total) by mouth 2 (two) times daily. 03/01/20   Arby Barrette, MD  famotidine (PEPCID) 20 MG tablet Take 1 tablet (20 mg total) by mouth 2 (two) times daily. 03/01/20   Arby Barrette, MD  fluconazole (DIFLUCAN) 150 MG tablet Take 1 tablet today. If still with symptoms may repeat in 3 days. 11/12/18   Georgetta Haber, NP  ondansetron (ZOFRAN ODT) 4 MG disintegrating tablet Take 1 tablet (4 mg total) by mouth every 4 (four) hours as needed for nausea or vomiting. 03/01/20   Arby Barrette, MD  ondansetron (ZOFRAN) 4 MG tablet Take 1 tablet (4 mg total) by mouth every 6 (six) hours as needed for nausea or vomiting. 02/20/18   Karie Soda, MD  oxyCODONE (OXY IR/ROXICODONE) 5 MG immediate release tablet Take 1-2 tablets (5-10 mg total) by mouth every 4 (four) hours as needed for moderate pain, severe pain or  breakthrough pain. 02/20/18   Karie Soda, MD    Allergies    Patient has no known allergies.  Review of Systems   Review of Systems 10 systems reviewed and negative except as per HPI Physical Exam Updated Vital Signs BP 90/61   Pulse 69   Temp 98.3 F (36.8 C) (Oral)   Resp 16   Ht 5\' 6"  (1.676 m)   Wt 55.2 kg   LMP 01/23/2020   SpO2 100%   BMI 19.64 kg/m   Physical Exam Constitutional:      Comments: Alert and nontoxic.  Well-nourished well-developed.  No respiratory distress.  Holding an emesis bag with clear watery fluid.  HENT:     Head: Normocephalic and atraumatic.     Mouth/Throat:     Pharynx: Oropharynx is clear.  Eyes:     Extraocular Movements: Extraocular movements intact.     Conjunctiva/sclera: Conjunctivae normal.  Cardiovascular:     Rate and Rhythm: Normal rate and regular rhythm.  Pulmonary:     Effort: Pulmonary effort is normal.     Breath sounds: Normal breath sounds.  Abdominal:     General: There is no distension.     Palpations: Abdomen is soft.     Tenderness: There is no abdominal tenderness. There is no guarding.  Musculoskeletal:        General: No swelling or tenderness. Normal range of motion.     Right lower leg: No edema.     Left lower leg: No edema.  Skin:    General: Skin is warm and dry.  Neurological:     General: No focal deficit present.     Mental Status: She is oriented to person, place, and time.     Motor: No weakness.     Coordination: Coordination normal.  Psychiatric:        Mood and Affect: Mood normal.     ED Results / Procedures / Treatments   Labs (all labs ordered are listed, but only abnormal results are displayed) Labs Reviewed  PREGNANCY, URINE - Abnormal; Notable for the following components:      Result Value   Preg Test, Ur POSITIVE (*)    All other components within normal limits  URINALYSIS, ROUTINE W REFLEX MICROSCOPIC - Abnormal; Notable for the following components:   APPearance CLOUDY  (*)    Specific Gravity, Urine >1.030 (*)    Hgb urine dipstick TRACE (*)    Ketones,  ur >80 (*)    Leukocytes,Ua SMALL (*)    All other components within normal limits  URINALYSIS, MICROSCOPIC (REFLEX) - Abnormal; Notable for the following components:   Bacteria, UA MANY (*)    All other components within normal limits  COMPREHENSIVE METABOLIC PANEL - Abnormal; Notable for the following components:   Sodium 134 (*)    Total Protein 8.6 (*)    All other components within normal limits  CBC WITH DIFFERENTIAL/PLATELET - Abnormal; Notable for the following components:   MCHC 36.6 (*)    RDW 11.4 (*)    All other components within normal limits  HCG, QUANTITATIVE, PREGNANCY - Abnormal; Notable for the following components:   hCG, Beta Chain, Quant, [place] 68,341 (*)    All other components within normal limits  RESP PANEL BY RT-PCR (FLU A&B, COVID) ARPGX2  URINE CULTURE  LIPASE, BLOOD    EKG None  Radiology No results found.  Procedures Procedures (including critical care time)  Medications Ordered in ED Medications  0.9 %  sodium chloride infusion (500 mLs Intravenous New Bag/Given 03/01/20 2256)  lactated ringers bolus 2,000 mL (0 mLs Intravenous Stopped 03/01/20 2251)  ondansetron (ZOFRAN) injection 4 mg (4 mg Intravenous Given 03/01/20 2050)  famotidine (PEPCID) IVPB 20 mg premix (0 mg Intravenous Stopped 03/01/20 2110)  cefTRIAXone (ROCEPHIN) 1 g in sodium chloride 0.9 % 100 mL IVPB (1 g Intravenous New Bag/Given 03/01/20 2257)    ED Course  I have reviewed the triage vital signs and the nursing notes.  Pertinent labs & imaging results that were available during my care of the patient were reviewed by me and considered in my medical decision making (see chart for details).  Clinical Course as of Mar 02 2  Caleen Essex Mar 01, 2020  2229 Patient feels much improved after Pepcid and Zofran.  She feels hungry.  She would like to try some fluids and crackers.   [MP]    Clinical  Course User Index [MP] Arby Barrette, MD   MDM Rules/Calculators/A&P                         Patient was much improved with Pepcid and Zofran.  She was able to tolerate crackers and ginger ale.  Urine does look infected.  Possible contaminant but significant WBCs present.  Patient given 1 IV dose of Rocephin and urine culture ordered.  Patient given prescription for Keflex.  Patient will call her OB physician at the beginning of the week to schedule her prenatal care.  She is well and pain-free at time of discharge.  Return precautions reviewed.  Final Clinical Impression(s) / ED Diagnoses Final diagnoses:  First trimester pregnancy  Non-intractable vomiting with nausea, unspecified vomiting type    Rx / DC Orders ED Discharge Orders         Ordered    ondansetron (ZOFRAN ODT) 4 MG disintegrating tablet  Every 4 hours PRN        03/01/20 2356    famotidine (PEPCID) 20 MG tablet  2 times daily        03/01/20 2356    cephALEXin (KEFLEX) 500 MG capsule  2 times daily        03/01/20 2358           Arby Barrette, MD 03/02/20 0006

## 2020-03-03 LAB — URINE CULTURE

## 2020-04-14 DIAGNOSIS — U071 COVID-19: Secondary | ICD-10-CM | POA: Diagnosis not present

## 2020-04-14 DIAGNOSIS — R059 Cough, unspecified: Secondary | ICD-10-CM | POA: Diagnosis not present

## 2020-06-03 ENCOUNTER — Emergency Department (HOSPITAL_COMMUNITY)
Admission: EM | Admit: 2020-06-03 | Discharge: 2020-06-03 | Disposition: A | Discharge status: Home / Self Care | Payer: Medicaid Other | Attending: Emergency Medicine | Admitting: Emergency Medicine

## 2020-06-03 DIAGNOSIS — R1084 Generalized abdominal pain: Secondary | ICD-10-CM | POA: Insufficient documentation

## 2020-06-03 DIAGNOSIS — R8290 Unspecified abnormal findings in urine: Secondary | ICD-10-CM

## 2020-06-03 DIAGNOSIS — Z87891 Personal history of nicotine dependence: Secondary | ICD-10-CM | POA: Insufficient documentation

## 2020-06-03 DIAGNOSIS — R1013 Epigastric pain: Secondary | ICD-10-CM | POA: Diagnosis present

## 2020-06-03 LAB — URINALYSIS, ROUTINE W REFLEX MICROSCOPIC
Bilirubin Urine: NEGATIVE
Glucose, UA: NEGATIVE mg/dL
Hgb urine dipstick: NEGATIVE
Ketones, ur: NEGATIVE mg/dL
Nitrite: NEGATIVE
Protein, ur: NEGATIVE mg/dL
Specific Gravity, Urine: 1.024 (ref 1.005–1.030)
pH: 6 (ref 5.0–8.0)

## 2020-06-03 LAB — COMPREHENSIVE METABOLIC PANEL
ALT: 9 U/L (ref 0–44)
AST: 15 U/L (ref 15–41)
Albumin: 4.2 g/dL (ref 3.5–5.0)
Alkaline Phosphatase: 51 U/L (ref 38–126)
Anion gap: 8 (ref 5–15)
BUN: 12 mg/dL (ref 6–20)
CO2: 24 mmol/L (ref 22–32)
Calcium: 9 mg/dL (ref 8.9–10.3)
Chloride: 106 mmol/L (ref 98–111)
Creatinine, Ser: 0.83 mg/dL (ref 0.44–1.00)
GFR, Estimated: >60 mL/min (ref >60)
Glucose, Bld: 97 mg/dL (ref 70–99)
Potassium: 3.7 mmol/L (ref 3.5–5.1)
Sodium: 138 mmol/L (ref 135–145)
Total Bilirubin: 0.6 mg/dL (ref 0.3–1.2)
Total Protein: 7.3 g/dL (ref 6.5–8.1)

## 2020-06-03 LAB — CBC
HCT: 36.1 % (ref 36.0–46.0)
Hemoglobin: 12.1 g/dL (ref 12.0–15.0)
MCH: 31 pg (ref 26.0–34.0)
MCHC: 33.5 g/dL (ref 30.0–36.0)
MCV: 92.6 fL (ref 80.0–100.0)
Platelets: 216 10*3/uL (ref 150–400)
RBC: 3.9 MIL/uL (ref 3.87–5.11)
RDW: 12.3 % (ref 11.5–15.5)
WBC: 3.3 10*3/uL — ABNORMAL LOW (ref 4.0–10.5)
nRBC: 0 % (ref 0.0–0.2)

## 2020-06-03 LAB — I-STAT BETA HCG BLOOD, ED (MC, WL, AP ONLY): I-stat hCG, quantitative: <5 m[IU]/mL (ref <5)

## 2020-06-03 LAB — LIPASE, BLOOD: Lipase: 26 U/L (ref 11–51)

## 2020-06-03 MED ORDER — CEPHALEXIN 500 MG PO CAPS: 500.0000 mg | ORAL_CAPSULE | Freq: Two times a day (BID) | ORAL | 0 refills | Status: DC

## 2020-06-03 MED ORDER — SODIUM CHLORIDE 0.9 % IV BOLUS
1000.0000 mL | Freq: Once | INTRAVENOUS | Status: AC
  Administered 2020-06-03: 1000 mL via INTRAVENOUS

## 2020-06-03 MED ORDER — FAMOTIDINE IN NACL 20-0.9 MG/50ML-% IV SOLN
20.0000 mg | INTRAVENOUS | Status: AC
  Administered 2020-06-03: 20 mg via INTRAVENOUS
  Filled 2020-06-03: qty 50

## 2020-06-03 NOTE — ED Triage Notes (Signed)
Pt came in with c/o epigastric and RUQ abdominal pain that started at 0300. Pt states it has been intermittent for a couple of months but has been more frequent recently. Pt had gallbladder removed in 2019. Pt denies N/V/D.

## 2020-06-03 NOTE — ED Provider Notes (Signed)
[facility]-EMERGENCY DEPT Provider Note   CSN: [id number] Arrival date & time: 06/03/20  0421     History Chief Complaint  Patient presents with  . Abdominal Pain    Bethany Gallagher is a 30 y.o. female.  Patient presents to the emergency department with a chief complaint of epigastric abdominal pain.  She states pain radiates into her right side as well.  She does have history of prior cholecystectomy 3 years ago.  She states that she has been having these pains intermittently for the past several months.  She denies any successful treatments prior to arrival.  Denies any fever, chills, cough.  She states that occasionally she feels nauseated, but she denies having had any vomiting.  She denies any diarrhea.  Denies any dysuria, hematuria, vaginal discharge, or bleeding.  She denies any other associated symptoms.  The history is provided by the patient. No language interpreter was used.       Past Medical History:  Diagnosis Date  . GERD (gastroesophageal reflux disease)   . Hx of chlamydia infection   . Hx of gonorrhea   . NSVD (normal spontaneous vaginal delivery) 11/23/2014  . Pregnancy induced hypertension    2013   . RUQ abdominal pain 02/2018  . Vaginal Pap smear, abnormal     Patient Active Problem List   Diagnosis Date Noted  . Chronic nausea 02/20/2018  . Hx of chlamydia infection   . GERD (gastroesophageal reflux disease)   . Acute with chronic cholecystitis s/p lap cholecystectomy 02/18/2018 02/18/2018  . Transaminitis 02/15/2018  . Supervision of normal pregnancy in third trimester 07/22/2017  . Mild tetrahydrocannabinol [facility]) abuse 04/13/2016    Past Surgical History:  Procedure Laterality Date  . CHOLECYSTECTOMY N/A 02/18/2018   Procedure: LAPAROSCOPIC CHOLECYSTECTOMY;  Surgeon: Berna Bue, MD;  Location: [facility] OR;  Service: General;  Laterality: N/A;  . NO PAST SURGERIES       OB History    Gravida  5   Para  5   Term   5   Preterm  0   AB  0   Living  5     SAB  0   IAB  0   Ectopic  0   Multiple  0   Live Births  5           Family History  Problem Relation Age of Onset  . Healthy Mother   . Healthy Father   . Cancer Maternal Grandfather   . Asthma Brother   . Alcohol abuse Neg Hx   . Diabetes Neg Hx   . Heart disease Neg Hx   . Hypertension Neg Hx   . Stroke Neg Hx     Social History   Tobacco Use  . Smoking status: Former Smoker    Types: Cigarettes  . Smokeless tobacco: Former Neurosurgeon    Quit date: 11/05/2015  . Tobacco comment: with + preg test  Vaping Use  . Vaping Use: Never used  Substance Use Topics  . Alcohol use: Not Currently  . Drug use: Yes    Types: Marijuana    Comment: denies use of marajuana with current pregnancy    Home Medications Prior to Admission medications   Medication Sig Start Date End Date Taking? Authorizing Provider  cephALEXin (KEFLEX) 500 MG capsule Take 2 capsules (1,000 mg total) by mouth 2 (two) times daily. 03/01/20   Arby Barrette, MD  famotidine (PEPCID) 20 MG tablet Take 1 tablet (  20 mg total) by mouth 2 (two) times daily. 03/01/20   Arby Barrette, MD  fluconazole (DIFLUCAN) 150 MG tablet Take 1 tablet today. If still with symptoms may repeat in 3 days. 11/12/18   Georgetta Haber, NP  ondansetron (ZOFRAN ODT) 4 MG disintegrating tablet Take 1 tablet (4 mg total) by mouth every 4 (four) hours as needed for nausea or vomiting. 03/01/20   Arby Barrette, MD  ondansetron (ZOFRAN) 4 MG tablet Take 1 tablet (4 mg total) by mouth every 6 (six) hours as needed for nausea or vomiting. 02/20/18   Karie Soda, MD  oxyCODONE (OXY IR/ROXICODONE) 5 MG immediate release tablet Take 1-2 tablets (5-10 mg total) by mouth every 4 (four) hours as needed for moderate pain, severe pain or breakthrough pain. 02/20/18   Karie Soda, MD    Allergies    Patient has no known allergies.  Review of Systems   Review of Systems  All other systems  reviewed and are negative.   Physical Exam Updated Vital Signs BP 109/65 (BP Location: Left Arm)   Pulse 75   Temp 98.6 F (37 C) (Oral)   Resp 18   Ht 5\' 5"  (1.651 m)   Wt 59 kg   SpO2 98%   BMI 21.63 kg/m   Physical Exam Vitals and nursing note reviewed.  Constitutional:      General: She is not in acute distress.    Appearance: She is well-developed and well-nourished.  HENT:     Head: Normocephalic and atraumatic.  Eyes:     Conjunctiva/sclera: Conjunctivae normal.  Cardiovascular:     Rate and Rhythm: Normal rate and regular rhythm.     Heart sounds: No murmur heard.   Pulmonary:     Effort: Pulmonary effort is normal. No respiratory distress.     Breath sounds: Normal breath sounds.  Abdominal:     Palpations: Abdomen is soft.     Tenderness: There is no abdominal tenderness.     Comments: No focal abdominal tenderness, no RLQ tenderness or pain at McBurney's point, no RUQ tenderness or Murphy's sign, no left-sided abdominal tenderness, no fluid wave, or signs of peritonitis   Musculoskeletal:        General: No edema. Normal range of motion.     Cervical back: Neck supple.  Skin:    General: Skin is warm and dry.  Neurological:     Mental Status: She is alert and oriented to person, place, and time.  Psychiatric:        Mood and Affect: Mood and affect and mood normal.        Behavior: Behavior normal.     ED Results / Procedures / Treatments   Labs (all labs ordered are listed, but only abnormal results are displayed) Labs Reviewed  LIPASE, BLOOD  COMPREHENSIVE METABOLIC PANEL  CBC  URINALYSIS, ROUTINE W REFLEX MICROSCOPIC  I-STAT BETA HCG BLOOD, ED ([facility], [facility], [facility] ONLY)    EKG None  Radiology No results found.  Procedures Procedures   Medications Ordered in ED Medications  sodium chloride 0.9 % bolus 1,000 mL (has no administration in time range)  famotidine (PEPCID) IVPB 20 mg premix (has no administration in time range)    ED Course   I have reviewed the triage vital signs and the nursing notes.  Pertinent labs & imaging results that were available during my care of the patient were reviewed by me and considered in my medical decision making (see chart for details).  MDM Rules/Calculators/A&P                          This patient complains of abdominal pain, this involves an extensive number of treatment options, and is a complaint that carries with it a high risk of complications and morbidity.    Differential Dx GERD, PUD, UTI, ectopic  Pertinent Labs I ordered, reviewed, and interpreted labs, which included no leukocytosis, electrolytes are normal, lipase normal, pregnancy test negative, urinalysis is abnormal.  Medications I ordered medication Pepcid and fluids.  Reassessments After the interventions stated above, I reevaluated the patient and found feeling improved.   Plan Discharged home with PCP follow-up.  Will cover with Keflex for abnormal urinalysis.    Final Clinical Impression(s) / ED Diagnoses Final diagnoses:  Generalized abdominal pain  Abnormal urinalysis    Rx / DC Orders ED Discharge Orders         Ordered    cephALEXin (KEFLEX) 500 MG capsule  2 times daily        06/03/20 0611           Roxy Horseman, PA-C 06/03/20 2703    Geoffery Lyons, MD 06/07/20 [phone]

## 2020-06-12 ENCOUNTER — Encounter: Payer: Self-pay | Admitting: Obstetrics

## 2020-06-12 ENCOUNTER — Other Ambulatory Visit (HOSPITAL_COMMUNITY)
Admission: RE | Admit: 2020-06-12 | Discharge: 2020-06-12 | Disposition: A | Discharge status: Home / Self Care | Payer: Medicaid Other | Source: Ambulatory Visit | Attending: Obstetrics | Admitting: Obstetrics

## 2020-06-12 ENCOUNTER — Ambulatory Visit (INDEPENDENT_AMBULATORY_CARE_PROVIDER_SITE_OTHER): Payer: Medicaid Other | Admitting: Obstetrics

## 2020-06-12 ENCOUNTER — Other Ambulatory Visit: Payer: Self-pay

## 2020-06-12 VITALS — BP 104/67 | HR 79 | Ht 65.0 in | Wt 135.0 lb

## 2020-06-12 DIAGNOSIS — M549 Dorsalgia, unspecified: Secondary | ICD-10-CM | POA: Diagnosis not present

## 2020-06-12 DIAGNOSIS — Z01419 Encounter for gynecological examination (general) (routine) without abnormal findings: Secondary | ICD-10-CM | POA: Diagnosis not present

## 2020-06-12 DIAGNOSIS — Z113 Encounter for screening for infections with a predominantly sexual mode of transmission: Secondary | ICD-10-CM | POA: Diagnosis not present

## 2020-06-12 DIAGNOSIS — N898 Other specified noninflammatory disorders of vagina: Secondary | ICD-10-CM

## 2020-06-12 DIAGNOSIS — Z124 Encounter for screening for malignant neoplasm of cervix: Secondary | ICD-10-CM | POA: Insufficient documentation

## 2020-06-12 LAB — POCT URINALYSIS DIPSTICK
Bilirubin, UA: NEGATIVE
Blood, UA: NEGATIVE
Glucose, UA: NEGATIVE
Ketones, UA: NEGATIVE
Nitrite, UA: NEGATIVE
Protein, UA: NEGATIVE
Spec Grav, UA: 1.02
Urobilinogen, UA: 0.2 U/dL
pH, UA: 7

## 2020-06-12 MED ORDER — CYCLOBENZAPRINE HCL 10 MG PO TABS: 10.0000 mg | ORAL_TABLET | Freq: Three times a day (TID) | ORAL | 1 refills | Status: DC | PRN

## 2020-06-12 MED ORDER — IBUPROFEN 800 MG PO TABS: 800.0000 mg | ORAL_TABLET | Freq: Three times a day (TID) | ORAL | 5 refills | Status: DC | PRN

## 2020-06-12 NOTE — Progress Notes (Signed)
Patient presents for Annual Exam.  LMP: 05/26/20 cycles usually last 5-7 days moderate flow. Last Pap: 04/03/2016 :  WNL  STD Screening: Full Panel  Contraception: none and none desired today  Family Hx of Breast Cancer: None   CC: Back Pain 10/10, pt denies any pain with urination pain onset this morning.  Pt just finished treatment Rx for UTI.

## 2020-06-12 NOTE — Progress Notes (Signed)
Subjective:        Bethany Gallagher is a 30 y.o. female here for a routine exam.  Current complaints: Awoke with a backache today.   Recently treated for UTI but says that this pain is different.  Also complains of vaginal discharge.  Personal health questionnaire:  Is patient Ashkenazi Jewish, have a family history of breast and/or ovarian cancer: no Is there a family history of uterine cancer diagnosed at age < 34, gastrointestinal cancer, urinary tract cancer, family member who is a Personnel officer syndrome-associated carrier: no Is the patient overweight and hypertensive, family history of diabetes, personal history of gestational diabetes, preeclampsia or PCOS: no Is patient over 48, have PCOS,  family history of premature CHD under age 34, diabetes, smoke, have hypertension or peripheral artery disease:  no At any time, has a partner hit, kicked or otherwise hurt or frightened you?: no Over the past 2 weeks, have you felt down, depressed or hopeless?: no Over the past 2 weeks, have you felt little interest or pleasure in doing things?:no   Gynecologic History No LMP recorded. Contraception: none Last Pap: 04-03-2016. Results were: normal Last mammogram: n/a. Results were: n/a  Obstetric History OB History  Gravida Para Term Preterm AB Living  5 5 5  0 0 5  SAB IAB Ectopic Multiple Live Births  0 0 0 0 5    # Outcome Date GA Lbr Len/2nd Weight Sex Delivery Anes PTL Lv  5 Term 08/08/17 [redacted]w[redacted]d 02:37 / 00:03 6 lb 12.5 oz (3.076 kg) M Vag-Spont EPI  LIV  4 Term 06/09/16 [redacted]w[redacted]d 20:16 / 00:07 6 lb 15.6 oz (3.165 kg) M Vag-Spont EPI  LIV  3 Term 11/23/14 [redacted]w[redacted]d 07:07 / 00:11 7 lb 4.4 oz (3.3 kg) M Vag-Spont EPI  LIV  2 Term 12/31/11 [redacted]w[redacted]d 03:12 / 00:10 4 lb 9.2 oz (2.075 kg) F Vag-Spont EPI  LIV  1 Term 2012 [redacted]w[redacted]d 48:00 5 lb 6 oz (2.438 kg) F Vag-Spont EPI  LIV    Past Medical History:  Diagnosis Date  . GERD (gastroesophageal reflux disease)   . Hx of chlamydia infection   . Hx of  gonorrhea   . NSVD (normal spontaneous vaginal delivery) 11/23/2014  . Pregnancy induced hypertension    2013   . RUQ abdominal pain 02/2018  . Vaginal Pap smear, abnormal     Past Surgical History:  Procedure Laterality Date  . CHOLECYSTECTOMY N/A 02/18/2018   Procedure: LAPAROSCOPIC CHOLECYSTECTOMY;  Surgeon: 02/20/2018, MD;  Location: [facility] OR;  Service: General;  Laterality: N/A;  . NO PAST SURGERIES       Current Outpatient Medications:  .  cephALEXin (KEFLEX) 500 MG capsule, Take 1 capsule (500 mg total) by mouth 2 (two) times daily., Disp: 14 capsule, Rfl: 0 .  cyclobenzaprine (FLEXERIL) 10 MG tablet, Take 1 tablet (10 mg total) by mouth every 8 (eight) hours as needed for muscle spasms., Disp: 30 tablet, Rfl: 1 .  ibuprofen (ADVIL) 800 MG tablet, Take 1 tablet (800 mg total) by mouth every 8 (eight) hours as needed., Disp: 30 tablet, Rfl: 5 .  famotidine (PEPCID) 20 MG tablet, Take 1 tablet (20 mg total) by mouth 2 (two) times daily. (Patient not taking: Reported on 06/12/2020), Disp: 30 tablet, Rfl: 0 .  fluconazole (DIFLUCAN) 150 MG tablet, Take 1 tablet today. If still with symptoms may repeat in 3 days. (Patient not taking: Reported on 06/12/2020), Disp: 2 tablet, Rfl: 0 No Known Allergies  Social  History   Tobacco Use  . Smoking status: Former Smoker    Types: Cigarettes  . Smokeless tobacco: Former Neurosurgeon    Quit date: 11/05/2015  Substance Use Topics  . Alcohol use: Not Currently    Family History  Problem Relation Age of Onset  . Healthy Mother   . Healthy Father   . Cancer Maternal Grandfather   . Asthma Brother   . Alcohol abuse Neg Hx   . Diabetes Neg Hx   . Heart disease Neg Hx   . Hypertension Neg Hx   . Stroke Neg Hx       Review of Systems  Constitutional: negative for fatigue and weight loss Respiratory: negative for cough and wheezing Cardiovascular: negative for chest pain, fatigue and palpitations Gastrointestinal: negative for abdominal pain  and change in bowel habits Musculoskeletal:negative for myalgias Neurological: negative for gait problems and tremors Behavioral/Psych: negative for abusive relationship, depression Endocrine: negative for temperature intolerance    Genitourinary:negative for abnormal menstrual periods, genital lesions, hot flashes, sexual problems.  Positive for vaginal discharge Integument/breast: negative for breast lump, breast tenderness, nipple discharge and skin lesion(s)    Objective:       BP 104/67   Pulse 79   Ht 5\' 5"  (1.651 m)   Wt 135 lb (61.2 kg)   BMI 22.47 kg/m  General:   alert and no distress  Skin:   no rash or abnormalities  Lungs:   clear to auscultation bilaterally  Heart:   regular rate and rhythm, S1, S2 normal, no murmur, click, rub or gallop  Breasts:   normal without suspicious masses, skin or nipple changes or axillary nodes  Abdomen:  normal findings: no organomegaly, soft, non-tender and no hernia  Pelvis:  External genitalia: normal general appearance Urinary system: urethral meatus normal and bladder without fullness, nontender Vaginal: normal without tenderness, induration or masses Cervix: normal appearance Adnexa: normal bimanual exam Uterus: anteverted and non-tender, normal size   Lab Review Urine pregnancy test Labs reviewed yes Radiologic studies reviewed no  50% of 20 min visit spent on counseling and coordination of care.   Assessment:     1. Encounter for routine gynecological examination with Papanicolaou smear of cervix Rx: - Cytology - PAP( [place])  2. Vaginal discharge Rx: - Cervicovaginal ancillary only( [place])  3. Screening for STD (sexually transmitted disease) Rx: - Hepatitis B surface antigen - Hepatitis C antibody - HIV Antibody (routine testing w rflx) - RPR  4. Backache symptom Rx: - ibuprofen (ADVIL) 800 MG tablet; Take 1 tablet (800 mg total) by mouth every 8 (eight) hours as needed.  Dispense: 30 tablet;  Refill: 5 - cyclobenzaprine (FLEXERIL) 10 MG tablet; Take 1 tablet (10 mg total) by mouth every 8 (eight) hours as needed for muscle spasms.  Dispense: 30 tablet; Refill: 1 - Urine Culture - POCT urinalysis dipstick    Plan:    Education reviewed: calcium supplements, depression evaluation, low fat, low cholesterol diet, safe sex/STD prevention, self breast exams and weight bearing exercise. Contraception: declines contraception at this time.  Cokndoms recommended for prevention of STD's.. Follow up in: 1 year.   Meds ordered this encounter  Medications  . ibuprofen (ADVIL) 800 MG tablet    Sig: Take 1 tablet (800 mg total) by mouth every 8 (eight) hours as needed.    Dispense:  30 tablet    Refill:  5  . cyclobenzaprine (FLEXERIL) 10 MG tablet    Sig: Take 1 tablet (  10 mg total) by mouth every 8 (eight) hours as needed for muscle spasms.    Dispense:  30 tablet    Refill:  1   Orders Placed This Encounter  Procedures  . Urine Culture  . Hepatitis B surface antigen  . Hepatitis C antibody  . HIV Antibody (routine testing w rflx)  . RPR  . POCT urinalysis dipstick    Brock Bad, MD 06/12/2020 10:45 AM

## 2020-06-13 ENCOUNTER — Other Ambulatory Visit: Payer: Self-pay | Admitting: Obstetrics

## 2020-06-13 DIAGNOSIS — A749 Chlamydial infection, unspecified: Secondary | ICD-10-CM

## 2020-06-13 DIAGNOSIS — B3731 Acute candidiasis of vulva and vagina: Secondary | ICD-10-CM

## 2020-06-13 DIAGNOSIS — B9689 Other specified bacterial agents as the cause of diseases classified elsewhere: Secondary | ICD-10-CM

## 2020-06-13 DIAGNOSIS — A599 Trichomoniasis, unspecified: Secondary | ICD-10-CM

## 2020-06-13 DIAGNOSIS — B373 Candidiasis of vulva and vagina: Secondary | ICD-10-CM

## 2020-06-13 LAB — CERVICOVAGINAL ANCILLARY ONLY
Bacterial Vaginitis (gardnerella): POSITIVE — AB
Candida Glabrata: NEGATIVE
Candida Vaginitis: POSITIVE — AB
Chlamydia: POSITIVE — AB
Comment: NEGATIVE
Comment: NEGATIVE
Comment: NEGATIVE
Comment: NEGATIVE
Comment: NEGATIVE
Comment: NORMAL
Neisseria Gonorrhea: NEGATIVE
Trichomonas: POSITIVE — AB

## 2020-06-13 LAB — HEPATITIS C ANTIBODY: Hep C Virus Ab: <0.1 s/co ratio (ref 0.0–0.9)

## 2020-06-13 LAB — CYTOLOGY - PAP: Diagnosis: NEGATIVE

## 2020-06-13 LAB — HIV ANTIBODY (ROUTINE TESTING W REFLEX): HIV Screen 4th Generation wRfx: NONREACTIVE

## 2020-06-13 LAB — RPR: RPR Ser Ql: NONREACTIVE

## 2020-06-13 LAB — HEPATITIS B SURFACE ANTIGEN: Hepatitis B Surface Ag: NEGATIVE

## 2020-06-13 MED ORDER — TINIDAZOLE 500 MG PO TABS: 2.0000 g | ORAL_TABLET | Freq: Every day | ORAL | 0 refills | Status: DC

## 2020-06-13 MED ORDER — FLUCONAZOLE 150 MG PO TABS: ORAL_TABLET | ORAL | 0 refills | Status: DC

## 2020-06-13 MED ORDER — DOXYCYCLINE HYCLATE 100 MG PO CAPS: 100.0000 mg | ORAL_CAPSULE | Freq: Two times a day (BID) | ORAL | 0 refills | Status: DC

## 2020-06-14 LAB — URINE CULTURE

## 2020-06-25 ENCOUNTER — Encounter (HOSPITAL_BASED_OUTPATIENT_CLINIC_OR_DEPARTMENT_OTHER): Payer: Self-pay | Admitting: Emergency Medicine

## 2020-06-25 ENCOUNTER — Emergency Department (HOSPITAL_BASED_OUTPATIENT_CLINIC_OR_DEPARTMENT_OTHER)
Admission: EM | Admit: 2020-06-25 | Discharge: 2020-06-25 | Disposition: A | Discharge status: Home / Self Care | Payer: Medicaid Other | Attending: Emergency Medicine | Admitting: Emergency Medicine

## 2020-06-25 ENCOUNTER — Other Ambulatory Visit: Payer: Self-pay

## 2020-06-25 ENCOUNTER — Emergency Department (HOSPITAL_BASED_OUTPATIENT_CLINIC_OR_DEPARTMENT_OTHER): Payer: Medicaid Other

## 2020-06-25 DIAGNOSIS — O219 Vomiting of pregnancy, unspecified: Secondary | ICD-10-CM | POA: Diagnosis not present

## 2020-06-25 DIAGNOSIS — R1013 Epigastric pain: Secondary | ICD-10-CM | POA: Diagnosis not present

## 2020-06-25 DIAGNOSIS — O26891 Other specified pregnancy related conditions, first trimester: Secondary | ICD-10-CM | POA: Insufficient documentation

## 2020-06-25 DIAGNOSIS — Z3491 Encounter for supervision of normal pregnancy, unspecified, first trimester: Secondary | ICD-10-CM

## 2020-06-25 DIAGNOSIS — Z87891 Personal history of nicotine dependence: Secondary | ICD-10-CM | POA: Diagnosis not present

## 2020-06-25 DIAGNOSIS — R109 Unspecified abdominal pain: Secondary | ICD-10-CM

## 2020-06-25 DIAGNOSIS — Z3A01 Less than 8 weeks gestation of pregnancy: Secondary | ICD-10-CM | POA: Diagnosis not present

## 2020-06-25 DIAGNOSIS — R112 Nausea with vomiting, unspecified: Secondary | ICD-10-CM

## 2020-06-25 LAB — URINALYSIS, ROUTINE W REFLEX MICROSCOPIC
Bilirubin Urine: NEGATIVE
Glucose, UA: NEGATIVE mg/dL
Hgb urine dipstick: NEGATIVE
Ketones, ur: NEGATIVE mg/dL
Nitrite: NEGATIVE
Protein, ur: NEGATIVE mg/dL
Specific Gravity, Urine: 1.025 (ref 1.005–1.030)
pH: 7 (ref 5.0–8.0)

## 2020-06-25 LAB — COMPREHENSIVE METABOLIC PANEL
ALT: 14 U/L (ref 0–44)
AST: 18 U/L (ref 15–41)
Albumin: 4.3 g/dL (ref 3.5–5.0)
Alkaline Phosphatase: 50 U/L (ref 38–126)
Anion gap: 8 (ref 5–15)
BUN: 7 mg/dL (ref 6–20)
CO2: 25 mmol/L (ref 22–32)
Calcium: 9.3 mg/dL (ref 8.9–10.3)
Chloride: 100 mmol/L (ref 98–111)
Creatinine, Ser: 0.73 mg/dL (ref 0.44–1.00)
GFR, Estimated: >60 mL/min (ref >60)
Glucose, Bld: 89 mg/dL (ref 70–99)
Potassium: 4.1 mmol/L (ref 3.5–5.1)
Sodium: 133 mmol/L — ABNORMAL LOW (ref 135–145)
Total Bilirubin: 0.4 mg/dL (ref 0.3–1.2)
Total Protein: 7.8 g/dL (ref 6.5–8.1)

## 2020-06-25 LAB — CBC WITH DIFFERENTIAL/PLATELET
Abs Immature Granulocytes: 0 10*3/uL (ref 0.00–0.07)
Basophils Absolute: 0 10*3/uL (ref 0.0–0.1)
Basophils Relative: 0 %
Eosinophils Absolute: 0 10*3/uL (ref 0.0–0.5)
Eosinophils Relative: 1 %
HCT: 39.7 % (ref 36.0–46.0)
Hemoglobin: 13.6 g/dL (ref 12.0–15.0)
Immature Granulocytes: 0 %
Lymphocytes Relative: 35 %
Lymphs Abs: 1.5 10*3/uL (ref 0.7–4.0)
MCH: 30.7 pg (ref 26.0–34.0)
MCHC: 34.3 g/dL (ref 30.0–36.0)
MCV: 89.6 fL (ref 80.0–100.0)
Monocytes Absolute: 0.3 10*3/uL (ref 0.1–1.0)
Monocytes Relative: 8 %
Neutro Abs: 2.5 10*3/uL (ref 1.7–7.7)
Neutrophils Relative %: 56 %
Platelets: 302 10*3/uL (ref 150–400)
RBC: 4.43 MIL/uL (ref 3.87–5.11)
RDW: 12 % (ref 11.5–15.5)
WBC: 4.3 10*3/uL (ref 4.0–10.5)
nRBC: 0 % (ref 0.0–0.2)

## 2020-06-25 LAB — URINALYSIS, MICROSCOPIC (REFLEX)

## 2020-06-25 LAB — PREGNANCY, URINE: Preg Test, Ur: POSITIVE — AB

## 2020-06-25 LAB — HCG, QUANTITATIVE, PREGNANCY: hCG, Beta Chain, Quant, S: 39656 m[IU]/mL — ABNORMAL HIGH (ref <5)

## 2020-06-25 LAB — LIPASE, BLOOD: Lipase: 25 U/L (ref 11–51)

## 2020-06-25 MED ORDER — ONDANSETRON HCL 4 MG/2ML IJ SOLN
4.0000 mg | Freq: Once | INTRAMUSCULAR | Status: AC
  Administered 2020-06-25: 4 mg via INTRAVENOUS
  Filled 2020-06-25: qty 2

## 2020-06-25 MED ORDER — SODIUM CHLORIDE 0.9 % IV BOLUS
1000.0000 mL | Freq: Once | INTRAVENOUS | Status: AC
  Administered 2020-06-25: 1000 mL via INTRAVENOUS

## 2020-06-25 MED ORDER — PROMETHAZINE HCL 25 MG PO TABS: 25.0000 mg | ORAL_TABLET | Freq: Four times a day (QID) | ORAL | 2 refills | Status: DC | PRN

## 2020-06-25 NOTE — ED Notes (Signed)
ED Provider at bedside. 

## 2020-06-25 NOTE — ED Triage Notes (Signed)
Pt arrives pov, reports possible pregnancy, endorses N/V with abdominal pain, pt also reports constipation.

## 2020-06-25 NOTE — ED Provider Notes (Signed)
[facility] EMERGENCY DEPARTMENT Provider Note   CSN: [id number] Arrival date & time: 06/25/20  0865     History Chief Complaint  Patient presents with  . Abdominal Pain    [name] is a 30 y.o. female.  The history is provided by the patient and medical records.  Abdominal Pain  Bethany Gallagher is a 30 y.o. female who presents to the Emergency Department complaining of abdominal pain and vomiting. She presents the emergency department complaining of one week of nausea, vomiting and epigastric pain. She has been vomiting several times daily. Denies any fevers, chest pain, dysuria, vaginal discharge, diarrhea, constipation. She believes that she is pregnant. She has had five vaginal deliveries without problems. She does have a history of gestational hypertension during one pregnancy. She has had three abortions, most recent one was in November 2021 when she had a D&C performed. She has a history of cholecystectomy in 2019. LMP was Feb 9.      Past Medical History:  Diagnosis Date  . GERD (gastroesophageal reflux disease)   . Hx of chlamydia infection   . Hx of gonorrhea   . NSVD (normal spontaneous vaginal delivery) 11/23/2014  . Pregnancy induced hypertension    2013   . RUQ abdominal pain 02/2018  . Vaginal Pap smear, abnormal     Patient Active Problem List   Diagnosis Date Noted  . Chronic nausea 02/20/2018  . Hx of chlamydia infection   . GERD (gastroesophageal reflux disease)   . Acute with chronic cholecystitis s/p lap cholecystectomy 02/18/2018 02/18/2018  . Transaminitis 02/15/2018  . Supervision of normal pregnancy in third trimester 07/22/2017  . Mild tetrahydrocannabinol [facility]) abuse 04/13/2016    Past Surgical History:  Procedure Laterality Date  . CHOLECYSTECTOMY N/A 02/18/2018   Procedure: LAPAROSCOPIC CHOLECYSTECTOMY;  Surgeon: Berna Bue, MD;  Location: [facility] OR;  Service: General;  Laterality: N/A;  . NO PAST SURGERIES        OB History    Gravida  5   Para  5   Term  5   Preterm  0   Bethany  0   Living  5     SAB  0   IAB  0   Ectopic  0   Multiple  0   Live Births  5           Family History  Problem Relation Age of Onset  . Healthy Mother   . Healthy Father   . Cancer Maternal Grandfather   . Asthma Brother   . Alcohol abuse Neg Hx   . Diabetes Neg Hx   . Heart disease Neg Hx   . Hypertension Neg Hx   . Stroke Neg Hx     Social History   Tobacco Use  . Smoking status: Former Smoker    Types: Cigarettes  . Smokeless tobacco: Former Neurosurgeon    Quit date: 11/05/2015  Vaping Use  . Vaping Use: Never used  Substance Use Topics  . Alcohol use: Not Currently  . Drug use: Not Currently    Types: Marijuana    Home Medications Prior to Admission medications   Medication Sig Start Date End Date Taking? Authorizing Provider  cephALEXin (KEFLEX) 500 MG capsule Take 1 capsule (500 mg total) by mouth 2 (two) times daily. 06/03/20   Roxy Horseman, PA-C  cyclobenzaprine (FLEXERIL) 10 MG tablet Take 1 tablet (10 mg total) by mouth every 8 (eight) hours as needed for muscle spasms.  06/12/20   Brock Bad, MD  doxycycline (VIBRAMYCIN) 100 MG capsule Take 1 capsule (100 mg total) by mouth 2 (two) times daily. 06/13/20   Brock Bad, MD  famotidine (PEPCID) 20 MG tablet Take 1 tablet (20 mg total) by mouth 2 (two) times daily. Patient not taking: Reported on 06/12/2020 03/01/20   Arby Barrette, MD  fluconazole (DIFLUCAN) 150 MG tablet Take 1 tablet today. If still with symptoms may repeat in 3 days. 06/13/20   Brock Bad, MD  ibuprofen (ADVIL) 800 MG tablet Take 1 tablet (800 mg total) by mouth every 8 (eight) hours as needed. 06/12/20   Brock Bad, MD  tinidazole (TINDAMAX) 500 MG tablet Take 4 tablets (2,000 mg total) by mouth daily with breakfast. 06/13/20   Brock Bad, MD    Allergies    Patient has no known allergies.  Review of Systems   Review of  Systems  Gastrointestinal: Positive for abdominal pain.  All other systems reviewed and are negative.   Physical Exam Updated Vital Signs BP 108/60 (BP Location: Left Arm)   Pulse 70   Temp 98 F (36.7 C) (Oral)   Resp 16   Ht 5\' 5"  (1.651 m)   Wt 58.1 kg   LMP 05/15/2020   SpO2 100%   BMI 21.30 kg/m   Physical Exam Vitals and nursing note reviewed.  Constitutional:      Appearance: She is well-developed.  HENT:     Head: Normocephalic and atraumatic.  Cardiovascular:     Rate and Rhythm: Normal rate and regular rhythm.  Pulmonary:     Effort: Pulmonary effort is normal. No respiratory distress.  Abdominal:     Palpations: Abdomen is soft.     Tenderness: There is no guarding or rebound.     Comments: Mild generalized abdominal tenderness  Musculoskeletal:        General: No swelling or tenderness.  Skin:    General: Skin is warm and dry.  Neurological:     Mental Status: She is alert and oriented to person, place, and time.  Psychiatric:        Behavior: Behavior normal.     ED Results / Procedures / Treatments   Labs (all labs ordered are listed, but only abnormal results are displayed) Labs Reviewed  COMPREHENSIVE METABOLIC PANEL - Abnormal; Notable for the following components:      Result Value   Sodium 133 (*)    All other components within normal limits  PREGNANCY, URINE - Abnormal; Notable for the following components:   Preg Test, Ur POSITIVE (*)    All other components within normal limits  URINALYSIS, ROUTINE W REFLEX MICROSCOPIC - Abnormal; Notable for the following components:   Leukocytes,Ua SMALL (*)    All other components within normal limits  HCG, QUANTITATIVE, PREGNANCY - Abnormal; Notable for the following components:   hCG, Beta Chain, Quant, S 39,656 (*)    All other components within normal limits  URINALYSIS, MICROSCOPIC (REFLEX) - Abnormal; Notable for the following components:   Bacteria, UA RARE (*)    All other components  within normal limits  CBC WITH DIFFERENTIAL/PLATELET  LIPASE, BLOOD    EKG None  Radiology 07/13/2020 OB LESS THAN 14 WEEKS WITH OB TRANSVAGINAL  Result Date: 06/25/2020 CLINICAL DATA:  Abdominal pain in 1st trimester pregnancy. EXAM: OBSTETRIC <14 WK 06/27/2020 AND TRANSVAGINAL OB US TECHNIQUE: Both transabdominal and transvaginal ultrasound examinations were performed for complete evaluation of the gestation as  well as the maternal uterus, adnexal regions, and pelvic cul-de-sac. Transvaginal technique was performed to assess early pregnancy. COMPARISON:  None. FINDINGS: Intrauterine gestational sac: Single Yolk sac:  Visualized. Embryo:  Visualized. Cardiac Activity: Visualized. Heart Rate: 122 bpm CRL:  7 mm   6 w   4 d                  [place] EDC: 02/14/2021 Subchorionic hemorrhage:  Small subchorionic hemorrhage is noted. Maternal uterus/adnexae: No fibroids identified. A left ovarian corpus luteum cyst is noted. Right ovary is normal in appearance. No adnexal mass or abnormal free fluid identified. IMPRESSION: Single living IUP with estimated gestational age of [redacted] weeks 4 days, and [place] EDC of 02/14/2021. Small subchorionic hemorrhage. Electronically Signed   By: Danae Orleans M.D.   On: 06/25/2020 12:06    Procedures Procedures   Medications Ordered in ED Medications  sodium chloride 0.9 % bolus 1,000 mL (0 mLs Intravenous Stopped 06/25/20 1255)  ondansetron (ZOFRAN) injection 4 mg (4 mg Intravenous Given 06/25/20 1146)    ED Course  I have reviewed the triage vital signs and the nursing notes.  Pertinent labs & imaging results that were available during my care of the patient were reviewed by me and considered in my medical decision making (see chart for details).    MDM Rules/Calculators/A&P                         patient here for evaluation of nausea, vomiting and abdominal discomfort. She is pregnant on testing today in the emergency department. She recently saw her OB/GYN for a routine evaluation  two weeks ago and was treated for STI at that time. Ultrasound demonstrates IUP at six weeks and four days. After treatment in the emergency department she is feeling improved. Plan to discharge home with OB follow-up and return precautions.  Presentation is not consistent with bowel obstruction, ectopic pregnancy, sepsis, appendicitis  Final Clinical Impression(s) / ED Diagnoses Final diagnoses:  First trimester pregnancy  Non-intractable vomiting with nausea, unspecified vomiting type    Rx / DC Orders ED Discharge Orders    None       Tilden Fossa, MD 06/25/20 1422

## 2020-06-25 NOTE — ED Notes (Signed)
Pt discharged to home. Discharge instructions have been discussed with patient and/or family members. Pt verbally acknowledges understanding d/c instructions, and endorses comprehension to checkout at registration before leaving.  °

## 2020-06-27 ENCOUNTER — Other Ambulatory Visit: Payer: Self-pay

## 2020-06-27 DIAGNOSIS — O219 Vomiting of pregnancy, unspecified: Secondary | ICD-10-CM

## 2020-06-27 MED ORDER — DOXYLAMINE-PYRIDOXINE 10-10 MG PO TBEC: 2.0000 | DELAYED_RELEASE_TABLET | Freq: Every day | ORAL | 5 refills | Status: DC

## 2020-07-30 ENCOUNTER — Ambulatory Visit: Payer: Medicaid Other | Admitting: Obstetrics

## 2020-09-11 ENCOUNTER — Ambulatory Visit: Payer: Medicaid Other | Admitting: Obstetrics

## 2020-10-04 DIAGNOSIS — Z5181 Encounter for therapeutic drug level monitoring: Secondary | ICD-10-CM | POA: Diagnosis not present

## 2020-10-08 DIAGNOSIS — Z5181 Encounter for therapeutic drug level monitoring: Secondary | ICD-10-CM | POA: Diagnosis not present

## 2020-10-10 DIAGNOSIS — Z5181 Encounter for therapeutic drug level monitoring: Secondary | ICD-10-CM | POA: Diagnosis not present

## 2020-10-14 DIAGNOSIS — Z5181 Encounter for therapeutic drug level monitoring: Secondary | ICD-10-CM | POA: Diagnosis not present

## 2020-10-16 DIAGNOSIS — Z5181 Encounter for therapeutic drug level monitoring: Secondary | ICD-10-CM | POA: Diagnosis not present

## 2020-10-21 DIAGNOSIS — Z5181 Encounter for therapeutic drug level monitoring: Secondary | ICD-10-CM | POA: Diagnosis not present

## 2020-10-23 DIAGNOSIS — Z5181 Encounter for therapeutic drug level monitoring: Secondary | ICD-10-CM | POA: Diagnosis not present

## 2020-10-28 DIAGNOSIS — Z5181 Encounter for therapeutic drug level monitoring: Secondary | ICD-10-CM | POA: Diagnosis not present

## 2020-10-30 DIAGNOSIS — Z5181 Encounter for therapeutic drug level monitoring: Secondary | ICD-10-CM | POA: Diagnosis not present

## 2020-11-04 DIAGNOSIS — Z5181 Encounter for therapeutic drug level monitoring: Secondary | ICD-10-CM | POA: Diagnosis not present

## 2020-11-06 DIAGNOSIS — Z5181 Encounter for therapeutic drug level monitoring: Secondary | ICD-10-CM | POA: Diagnosis not present

## 2020-11-18 DIAGNOSIS — Z5181 Encounter for therapeutic drug level monitoring: Secondary | ICD-10-CM | POA: Diagnosis not present

## 2020-11-20 DIAGNOSIS — Z5181 Encounter for therapeutic drug level monitoring: Secondary | ICD-10-CM | POA: Diagnosis not present

## 2020-11-25 DIAGNOSIS — Z5181 Encounter for therapeutic drug level monitoring: Secondary | ICD-10-CM | POA: Diagnosis not present

## 2020-11-27 DIAGNOSIS — Z5181 Encounter for therapeutic drug level monitoring: Secondary | ICD-10-CM | POA: Diagnosis not present

## 2020-12-02 DIAGNOSIS — Z5181 Encounter for therapeutic drug level monitoring: Secondary | ICD-10-CM | POA: Diagnosis not present

## 2020-12-04 DIAGNOSIS — Z5181 Encounter for therapeutic drug level monitoring: Secondary | ICD-10-CM | POA: Diagnosis not present

## 2020-12-10 DIAGNOSIS — Z5181 Encounter for therapeutic drug level monitoring: Secondary | ICD-10-CM | POA: Diagnosis not present

## 2020-12-12 DIAGNOSIS — Z5181 Encounter for therapeutic drug level monitoring: Secondary | ICD-10-CM | POA: Diagnosis not present

## 2020-12-16 DIAGNOSIS — Z5181 Encounter for therapeutic drug level monitoring: Secondary | ICD-10-CM | POA: Diagnosis not present

## 2020-12-18 DIAGNOSIS — Z5181 Encounter for therapeutic drug level monitoring: Secondary | ICD-10-CM | POA: Diagnosis not present

## 2020-12-23 DIAGNOSIS — Z5181 Encounter for therapeutic drug level monitoring: Secondary | ICD-10-CM | POA: Diagnosis not present

## 2020-12-25 DIAGNOSIS — Z5181 Encounter for therapeutic drug level monitoring: Secondary | ICD-10-CM | POA: Diagnosis not present

## 2021-01-16 ENCOUNTER — Other Ambulatory Visit: Payer: Self-pay

## 2021-01-16 ENCOUNTER — Emergency Department (HOSPITAL_COMMUNITY): Payer: Medicaid Other

## 2021-01-16 ENCOUNTER — Encounter (HOSPITAL_COMMUNITY): Payer: Self-pay | Admitting: Emergency Medicine

## 2021-01-16 ENCOUNTER — Ambulatory Visit: Payer: Medicaid Other | Admitting: Obstetrics

## 2021-01-16 ENCOUNTER — Emergency Department (HOSPITAL_COMMUNITY)
Admission: EM | Admit: 2021-01-16 | Discharge: 2021-01-16 | Disposition: A | Discharge status: Home / Self Care | Payer: Medicaid Other | Attending: Emergency Medicine | Admitting: Emergency Medicine

## 2021-01-16 DIAGNOSIS — Z87891 Personal history of nicotine dependence: Secondary | ICD-10-CM | POA: Diagnosis not present

## 2021-01-16 DIAGNOSIS — Y92410 Unspecified street and highway as the place of occurrence of the external cause: Secondary | ICD-10-CM | POA: Insufficient documentation

## 2021-01-16 DIAGNOSIS — S0990XA Unspecified injury of head, initial encounter: Secondary | ICD-10-CM | POA: Insufficient documentation

## 2021-01-16 NOTE — Discharge Instructions (Addendum)
Your CT scan was negative for any abnormalities. Take tylenol or ibuprofen for pain. Return to the emergency department immediately if you develop any of the following symptoms: You have numbness, tingling, or weakness in the arms or legs. You develop severe headaches not relieved with medicine. You have severe neck pain, especially tenderness in the middle of the back of your neck. You have changes in bowel or bladder control. There is increasing pain in any area of the body. You have shortness of breath, light-headedness, dizziness, or fainting. You have chest pain. You feel sick to your stomach (nauseous), throw up (vomit), or sweat. You have increasing abdominal discomfort. There is blood in your urine, stool, or vomit. You have pain in your shoulder (shoulder strap areas). You feel your symptoms are getting worse.
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
## 2021-01-16 NOTE — ED Notes (Signed)
Pt ambulatory in ED lobby. 

## 2021-01-22 ENCOUNTER — Ambulatory Visit: Payer: Medicaid Other | Admitting: Obstetrics

## 2021-02-04 ENCOUNTER — Ambulatory Visit: Payer: Medicaid Other | Admitting: Obstetrics

## 2021-03-06 DIAGNOSIS — Z419 Encounter for procedure for purposes other than remedying health state, unspecified: Secondary | ICD-10-CM | POA: Diagnosis not present

## 2021-03-17 DIAGNOSIS — K0889 Other specified disorders of teeth and supporting structures: Secondary | ICD-10-CM | POA: Diagnosis not present

## 2021-04-06 DIAGNOSIS — Z419 Encounter for procedure for purposes other than remedying health state, unspecified: Secondary | ICD-10-CM | POA: Diagnosis not present

## 2021-05-07 DIAGNOSIS — Z419 Encounter for procedure for purposes other than remedying health state, unspecified: Secondary | ICD-10-CM | POA: Diagnosis not present

## 2021-06-04 DIAGNOSIS — Z419 Encounter for procedure for purposes other than remedying health state, unspecified: Secondary | ICD-10-CM | POA: Diagnosis not present

## 2021-07-05 DIAGNOSIS — Z419 Encounter for procedure for purposes other than remedying health state, unspecified: Secondary | ICD-10-CM | POA: Diagnosis not present

## 2021-08-04 DIAGNOSIS — Z419 Encounter for procedure for purposes other than remedying health state, unspecified: Secondary | ICD-10-CM | POA: Diagnosis not present

## 2021-08-28 ENCOUNTER — Ambulatory Visit: Payer: Medicaid Other | Admitting: Obstetrics & Gynecology

## 2021-09-04 DIAGNOSIS — Z419 Encounter for procedure for purposes other than remedying health state, unspecified: Secondary | ICD-10-CM | POA: Diagnosis not present

## 2021-10-04 DIAGNOSIS — Z419 Encounter for procedure for purposes other than remedying health state, unspecified: Secondary | ICD-10-CM | POA: Diagnosis not present

## 2021-11-04 DIAGNOSIS — Z419 Encounter for procedure for purposes other than remedying health state, unspecified: Secondary | ICD-10-CM | POA: Diagnosis not present

## 2021-12-05 DIAGNOSIS — Z419 Encounter for procedure for purposes other than remedying health state, unspecified: Secondary | ICD-10-CM | POA: Diagnosis not present

## 2022-01-04 DIAGNOSIS — Z419 Encounter for procedure for purposes other than remedying health state, unspecified: Secondary | ICD-10-CM | POA: Diagnosis not present

## 2022-02-02 ENCOUNTER — Ambulatory Visit: Payer: Medicaid Other | Admitting: Advanced Practice Midwife

## 2022-02-04 DIAGNOSIS — Z419 Encounter for procedure for purposes other than remedying health state, unspecified: Secondary | ICD-10-CM | POA: Diagnosis not present

## 2022-03-06 DIAGNOSIS — Z419 Encounter for procedure for purposes other than remedying health state, unspecified: Secondary | ICD-10-CM | POA: Diagnosis not present

## 2022-04-06 DIAGNOSIS — Z419 Encounter for procedure for purposes other than remedying health state, unspecified: Secondary | ICD-10-CM | POA: Diagnosis not present

## 2022-04-23 ENCOUNTER — Ambulatory Visit: Payer: Medicaid Other | Admitting: Obstetrics and Gynecology

## 2022-05-07 DIAGNOSIS — Z419 Encounter for procedure for purposes other than remedying health state, unspecified: Secondary | ICD-10-CM | POA: Diagnosis not present

## 2022-06-05 DIAGNOSIS — Z419 Encounter for procedure for purposes other than remedying health state, unspecified: Secondary | ICD-10-CM | POA: Diagnosis not present

## 2022-07-06 DIAGNOSIS — Z419 Encounter for procedure for purposes other than remedying health state, unspecified: Secondary | ICD-10-CM | POA: Diagnosis not present

## 2022-08-05 DIAGNOSIS — Z419 Encounter for procedure for purposes other than remedying health state, unspecified: Secondary | ICD-10-CM | POA: Diagnosis not present

## 2022-09-05 DIAGNOSIS — Z419 Encounter for procedure for purposes other than remedying health state, unspecified: Secondary | ICD-10-CM | POA: Diagnosis not present

## 2022-10-05 DIAGNOSIS — Z419 Encounter for procedure for purposes other than remedying health state, unspecified: Secondary | ICD-10-CM | POA: Diagnosis not present

## 2022-11-05 DIAGNOSIS — Z419 Encounter for procedure for purposes other than remedying health state, unspecified: Secondary | ICD-10-CM | POA: Diagnosis not present

## 2022-12-06 DIAGNOSIS — Z419 Encounter for procedure for purposes other than remedying health state, unspecified: Secondary | ICD-10-CM | POA: Diagnosis not present

## 2023-01-05 DIAGNOSIS — Z419 Encounter for procedure for purposes other than remedying health state, unspecified: Secondary | ICD-10-CM | POA: Diagnosis not present

## 2023-02-05 DIAGNOSIS — Z419 Encounter for procedure for purposes other than remedying health state, unspecified: Secondary | ICD-10-CM | POA: Diagnosis not present

## 2023-03-07 DIAGNOSIS — Z419 Encounter for procedure for purposes other than remedying health state, unspecified: Secondary | ICD-10-CM | POA: Diagnosis not present

## 2023-04-07 DIAGNOSIS — Z419 Encounter for procedure for purposes other than remedying health state, unspecified: Secondary | ICD-10-CM | POA: Diagnosis not present

## 2023-05-08 DIAGNOSIS — Z419 Encounter for procedure for purposes other than remedying health state, unspecified: Secondary | ICD-10-CM | POA: Diagnosis not present
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
## 2023-06-05 DIAGNOSIS — Z419 Encounter for procedure for purposes other than remedying health state, unspecified: Secondary | ICD-10-CM | POA: Diagnosis not present

## 2023-07-17 DIAGNOSIS — Z419 Encounter for procedure for purposes other than remedying health state, unspecified: Secondary | ICD-10-CM | POA: Diagnosis not present

## 2023-08-16 DIAGNOSIS — Z419 Encounter for procedure for purposes other than remedying health state, unspecified: Secondary | ICD-10-CM | POA: Diagnosis not present

## 2023-08-18 ENCOUNTER — Encounter (HOSPITAL_COMMUNITY): Payer: Self-pay

## 2023-08-18 ENCOUNTER — Emergency Department (HOSPITAL_COMMUNITY): Admission: EM | Admit: 2023-08-18 | Discharge: 2023-08-18 | Disposition: A | Discharge status: Home / Self Care

## 2023-08-18 ENCOUNTER — Other Ambulatory Visit: Payer: Self-pay

## 2023-08-18 DIAGNOSIS — Z3201 Encounter for pregnancy test, result positive: Secondary | ICD-10-CM | POA: Insufficient documentation

## 2023-08-18 DIAGNOSIS — Z3200 Encounter for pregnancy test, result unknown: Secondary | ICD-10-CM | POA: Diagnosis present

## 2023-08-18 LAB — HCG, QUANTITATIVE, PREGNANCY: hCG, Beta Chain, Quant, S: 157 m[IU]/mL — ABNORMAL HIGH (ref <5)

## 2023-08-18 LAB — POC URINE PREG, ED: Preg Test, Ur: POSITIVE — AB

## 2023-08-18 NOTE — ED Triage Notes (Signed)
 Pt reports needing a pregnancy test. Pt reports having both positive and negative pregnancy tests at home. Pt reports last period was April 11th. Pt has 5 children, and reports she doesn't feel like she's pregnant.

## 2023-08-18 NOTE — Discharge Instructions (Addendum)
 It was a pleasure caring for you today. Please follow up with your primary care and OBGYN. Seek emergency care if experiencing any new or worsening problems.

## 2023-08-18 NOTE — ED Provider Notes (Signed)
 [place] EMERGENCY DEPARTMENT AT [facility] Provider Note   CSN: [id number] Arrival date & time: 08/18/23  2040     History  Chief Complaint  Patient presents with   Possible Pregnancy    Bethany Gallagher is a 33 y.o. female with PMHx GERD, who presents to the ED concerned that she might be pregnant. Patient has no new symptoms today, but states that her last period was April 11th and she had positive and negative pregnancy tests at home. Patient has 5 children and does not want anymore.    Possible Pregnancy       Home Medications Prior to Admission medications   Medication Sig Start Date End Date Taking? Authorizing Provider  Doxylamine -Pyridoxine  (DICLEGIS ) 10-10 MG TBEC Take 2 tablets by mouth at bedtime. If symptoms persist, add one tablet in the morning and one in the afternoon 06/27/20   Gabrielle Joiner, MD  cephALEXin  (KEFLEX ) 500 MG capsule Take 1 capsule (500 mg total) by mouth 2 (two) times daily. 06/03/20   Sherel Dikes, PA-C  cyclobenzaprine  (FLEXERIL ) 10 MG tablet Take 1 tablet (10 mg total) by mouth every 8 (eight) hours as needed for muscle spasms. 06/12/20   Gabrielle Joiner, MD  doxycycline  (VIBRAMYCIN ) 100 MG capsule Take 1 capsule (100 mg total) by mouth 2 (two) times daily. 06/13/20   Gabrielle Joiner, MD  famotidine  (PEPCID ) 20 MG tablet Take 1 tablet (20 mg total) by mouth 2 (two) times daily. Patient not taking: Reported on 06/12/2020 03/01/20   Wynetta Heckle, MD  fluconazole  (DIFLUCAN ) 150 MG tablet Take 1 tablet today. If still with symptoms may repeat in 3 days. 06/13/20   Gabrielle Joiner, MD  ibuprofen  (ADVIL ) 800 MG tablet Take 1 tablet (800 mg total) by mouth every 8 (eight) hours as needed. 06/12/20   Gabrielle Joiner, MD  promethazine  (PHENERGAN ) 25 MG tablet Take 1 tablet (25 mg total) by mouth every 6 (six) hours as needed for nausea or vomiting. 06/25/20   Gabrielle Joiner, MD  tinidazole  (TINDAMAX ) 500 MG tablet Take 4 tablets  (2,000 mg total) by mouth daily with breakfast. 06/13/20   Gabrielle Joiner, MD      Allergies    Patient has no known allergies.    Review of Systems   Review of Systems  Constitutional:        Possible pregnancy    Physical Exam Updated Vital Signs BP (!) 113/57 (BP Location: Right Arm)   Pulse 84   Temp 98.7 F (37.1 C) (Oral)   Resp 18   Ht 5\' 5"  (1.651 m)   Wt 61.2 kg   LMP 07/16/2023 (Exact Date)   SpO2 100%   BMI 22.47 kg/m  Physical Exam Vitals and nursing note reviewed.  Constitutional:      General: She is not in acute distress.    Appearance: She is not ill-appearing or toxic-appearing.  HENT:     Head: Normocephalic and atraumatic.     Mouth/Throat:     Mouth: Mucous membranes are moist.  Eyes:     General: No scleral icterus.       Right eye: No discharge.        Left eye: No discharge.     Conjunctiva/sclera: Conjunctivae normal.  Cardiovascular:     Rate and Rhythm: Normal rate and regular rhythm.     Pulses: Normal pulses.     Heart sounds: Normal heart sounds. No murmur heard. Pulmonary:  Effort: Pulmonary effort is normal. No respiratory distress.     Breath sounds: Normal breath sounds. No wheezing, rhonchi or rales.  Abdominal:     General: Abdomen is flat. Bowel sounds are normal. There is no distension.     Palpations: Abdomen is soft. There is no mass.     Tenderness: There is no abdominal tenderness.  Musculoskeletal:     Right lower leg: No edema.     Left lower leg: No edema.  Skin:    General: Skin is warm and dry.     Findings: No rash.  Neurological:     General: No focal deficit present.     Mental Status: She is alert and oriented to person, place, and time. Mental status is at baseline.  Psychiatric:        Mood and Affect: Mood normal.        Behavior: Behavior normal.     ED Results / Procedures / Treatments   Labs (all labs ordered are listed, but only abnormal results are displayed) Labs Reviewed  POC URINE  PREG, ED - Abnormal; Notable for the following components:      Result Value   Preg Test, Ur POSITIVE (*)    All other components within normal limits  HCG, QUANTITATIVE, PREGNANCY    EKG None  Radiology No results found.  Procedures Procedures    Medications Ordered in ED Medications - No data to display  ED Course/ Medical Decision Making/ A&P                                 Medical Decision Making  This patient presents to the ED for concern of possible pregnancy, this involves an extensive number of treatment options, and is a complaint that carries with it a high risk of complications and morbidity.  The differential diagnosis includes miscarriage, ectopic pregnancy, viable pregnancy - this list is not exhaustive   Co morbidities that complicate the patient evaluation  none   Additional history obtained:  No PCP listed in chart. Will refer to community clinic    Problem List / ED Course / Critical interventions / Medication management  Patient concerned that she might be pregnant because her LMP was >1 month ago and she had inconsistent UPT's at home. Patient denies any new symptoms today. She only came to the Emergency Department for a pregnancy test.  I Ordered, and personally interpreted labs.  UPT positive. Shared all results with patient. Answered all questions. I will obtain hCG quant for patient, but she does not have to wait here for this result as she is currently asymptomatic. Patient understands to follow up with this result in her MyChart and follow up with PCP and OBGYN. I have reviewed the patients home medicines and have made adjustments as needed The patient has been appropriately medically screened and/or stabilized in the ED. I have low suspicion for any other emergent medical condition which would require further screening, evaluation or treatment in the ED or require inpatient management. At time of discharge the patient is hemodynamically stable  and in no acute distress. I have discussed work-up results and diagnosis with patient and answered all questions. Patient is agreeable with discharge plan. We discussed strict return precautions for returning to the emergency department and they verbalized understanding.     Social Determinants of Health:  none         Final Clinical Impression(s) /  ED Diagnoses Final diagnoses:  Pregnancy test positive    Rx / DC Orders ED Discharge Orders     None         [place], [place] 08/18/23 2133    Rolinda Climes, DO 08/19/23 0001

## 2023-09-01 ENCOUNTER — Other Ambulatory Visit: Payer: Self-pay

## 2023-09-01 ENCOUNTER — Emergency Department (HOSPITAL_COMMUNITY)
Admission: EM | Admit: 2023-09-01 | Discharge: 2023-09-01 | Disposition: A | Discharge status: Home / Self Care | Attending: Emergency Medicine | Admitting: Emergency Medicine

## 2023-09-01 ENCOUNTER — Encounter (HOSPITAL_COMMUNITY): Payer: Self-pay

## 2023-09-01 DIAGNOSIS — O210 Mild hyperemesis gravidarum: Secondary | ICD-10-CM | POA: Insufficient documentation

## 2023-09-01 DIAGNOSIS — Z3A01 Less than 8 weeks gestation of pregnancy: Secondary | ICD-10-CM | POA: Diagnosis not present

## 2023-09-01 DIAGNOSIS — O219 Vomiting of pregnancy, unspecified: Secondary | ICD-10-CM | POA: Diagnosis present

## 2023-09-01 DIAGNOSIS — O26891 Other specified pregnancy related conditions, first trimester: Secondary | ICD-10-CM | POA: Insufficient documentation

## 2023-09-01 DIAGNOSIS — R1010 Upper abdominal pain, unspecified: Secondary | ICD-10-CM | POA: Insufficient documentation

## 2023-09-01 LAB — COMPREHENSIVE METABOLIC PANEL WITH GFR
ALT: 20 U/L (ref 0–44)
AST: 21 U/L (ref 15–41)
Albumin: 4.4 g/dL (ref 3.5–5.0)
Alkaline Phosphatase: 60 U/L (ref 38–126)
Anion gap: 10 (ref 5–15)
BUN: 10 mg/dL (ref 6–20)
CO2: 24 mmol/L (ref 22–32)
Calcium: 9.7 mg/dL (ref 8.9–10.3)
Chloride: 101 mmol/L (ref 98–111)
Creatinine, Ser: 0.73 mg/dL (ref 0.44–1.00)
GFR, Estimated: >60 mL/min (ref >60)
Glucose, Bld: 94 mg/dL (ref 70–99)
Potassium: 3.4 mmol/L — ABNORMAL LOW (ref 3.5–5.1)
Sodium: 135 mmol/L (ref 135–145)
Total Bilirubin: 0.8 mg/dL (ref 0.0–1.2)
Total Protein: 8.1 g/dL (ref 6.5–8.1)

## 2023-09-01 LAB — URINALYSIS, ROUTINE W REFLEX MICROSCOPIC
Bacteria, UA: NONE SEEN
Glucose, UA: NEGATIVE mg/dL
Hgb urine dipstick: NEGATIVE
Ketones, ur: 80 mg/dL — AB
Leukocytes,Ua: NEGATIVE
Nitrite: NEGATIVE
Protein, ur: 100 mg/dL — AB
Specific Gravity, Urine: 1.036 — ABNORMAL HIGH (ref 1.005–1.030)
pH: 5 (ref 5.0–8.0)

## 2023-09-01 LAB — CBC
HCT: 37.7 % (ref 36.0–46.0)
Hemoglobin: 13.6 g/dL (ref 12.0–15.0)
MCH: 31.4 pg (ref 26.0–34.0)
MCHC: 36.1 g/dL — ABNORMAL HIGH (ref 30.0–36.0)
MCV: 87.1 fL (ref 80.0–100.0)
Platelets: 287 10*3/uL (ref 150–400)
RBC: 4.33 MIL/uL (ref 3.87–5.11)
RDW: 11.5 % (ref 11.5–15.5)
WBC: 6.2 10*3/uL (ref 4.0–10.5)
nRBC: 0 % (ref 0.0–0.2)

## 2023-09-01 LAB — LIPASE, BLOOD: Lipase: 22 U/L (ref 11–51)

## 2023-09-01 MED ORDER — METOCLOPRAMIDE HCL 5 MG/ML IJ SOLN
10.0000 mg | Freq: Once | INTRAMUSCULAR | Status: AC
  Administered 2023-09-01: 10 mg via INTRAVENOUS
  Filled 2023-09-01: qty 2

## 2023-09-01 MED ORDER — DEXTROSE 5 % IN LACTATED RINGERS IV BOLUS
1000.0000 mL | Freq: Once | INTRAVENOUS | Status: AC
  Administered 2023-09-01: 1000 mL via INTRAVENOUS

## 2023-09-01 MED ORDER — FAMOTIDINE IN NACL 20-0.9 MG/50ML-% IV SOLN
20.0000 mg | Freq: Once | INTRAVENOUS | Status: AC
  Administered 2023-09-01: 20 mg via INTRAVENOUS
  Filled 2023-09-01: qty 50

## 2023-09-01 MED ORDER — METOCLOPRAMIDE HCL 10 MG PO TABS: 10.0000 mg | ORAL_TABLET | Freq: Four times a day (QID) | ORAL | 0 refills | Status: DC

## 2023-09-01 NOTE — ED Triage Notes (Signed)
 Pt states she is [redacted] weeks pregnant and has been having vomiting for the past 5 days, states it is constant and all day.

## 2023-09-01 NOTE — Discharge Instructions (Addendum)
 Follow-up with your obstetrician.  Try and keep yourself hydrated.

## 2023-09-01 NOTE — ED Provider Notes (Signed)
 [place] EMERGENCY DEPARTMENT AT [facility] Provider Note   CSN: [id number] Arrival date & time: 09/01/23  4098     History  Chief Complaint  Patient presents with   Emesis During Pregnancy    Bethany Gallagher is a 33 y.o. female.  HPI Patient presents with nausea vomiting and abdominal pain.  She is [redacted] weeks pregnant with recent positive pregnancy test in the ER.  States for around the last 3 days she has been having trouble keeping anything down.  Still having bowel movements.  Does have some upper abdominal pain.    Home Medications Prior to Admission medications   Medication Sig Start Date End Date Taking? Authorizing Provider  Doxylamine -Pyridoxine  (DICLEGIS ) 10-10 MG TBEC Take 2 tablets by mouth at bedtime. If symptoms persist, add one tablet in the morning and one in the afternoon 06/27/20   Gabrielle Joiner, MD  metoCLOPramide  (REGLAN ) 10 MG tablet Take 1 tablet (10 mg total) by mouth every 6 (six) hours. 09/01/23  Yes Mozell Arias, MD  cephALEXin  (KEFLEX ) 500 MG capsule Take 1 capsule (500 mg total) by mouth 2 (two) times daily. 06/03/20   Sherel Dikes, PA-C  cyclobenzaprine  (FLEXERIL ) 10 MG tablet Take 1 tablet (10 mg total) by mouth every 8 (eight) hours as needed for muscle spasms. 06/12/20   Gabrielle Joiner, MD  doxycycline  (VIBRAMYCIN ) 100 MG capsule Take 1 capsule (100 mg total) by mouth 2 (two) times daily. 06/13/20   Gabrielle Joiner, MD  famotidine  (PEPCID ) 20 MG tablet Take 1 tablet (20 mg total) by mouth 2 (two) times daily. Patient not taking: Reported on 06/12/2020 03/01/20   Wynetta Heckle, MD  fluconazole  (DIFLUCAN ) 150 MG tablet Take 1 tablet today. If still with symptoms may repeat in 3 days. 06/13/20   Gabrielle Joiner, MD  ibuprofen  (ADVIL ) 800 MG tablet Take 1 tablet (800 mg total) by mouth every 8 (eight) hours as needed. 06/12/20   Gabrielle Joiner, MD  promethazine  (PHENERGAN ) 25 MG tablet Take 1 tablet (25 mg total) by mouth every  6 (six) hours as needed for nausea or vomiting. 06/25/20   Gabrielle Joiner, MD  tinidazole  (TINDAMAX ) 500 MG tablet Take 4 tablets (2,000 mg total) by mouth daily with breakfast. 06/13/20   Gabrielle Joiner, MD      Allergies    Patient has no known allergies.    Review of Systems   Review of Systems  Physical Exam Updated Vital Signs BP 105/83 (BP Location: Left Arm)   Pulse 67   Temp 98.1 F (36.7 C) (Oral)   Resp 16   LMP 07/16/2023 (Exact Date)   SpO2 98%  Physical Exam Vitals and nursing note reviewed.  Cardiovascular:     Rate and Rhythm: Regular rhythm.  Abdominal:     Comments: Upper abdominal tenderness no rebound or guarding.  No hernia palpated.  Skin:    Capillary Refill: Capillary refill takes less than 2 seconds.  Neurological:     Mental Status: She is alert and oriented to person, place, and time.     ED Results / Procedures / Treatments   Labs (all labs ordered are listed, but only abnormal results are displayed) Labs Reviewed  COMPREHENSIVE METABOLIC PANEL WITH GFR - Abnormal; Notable for the following components:      Result Value   Potassium 3.4 (*)    All other components within normal limits  CBC - Abnormal; Notable for the following components:  MCHC 36.1 (*)    All other components within normal limits  URINALYSIS, ROUTINE W REFLEX MICROSCOPIC - Abnormal; Notable for the following components:   Color, Urine AMBER (*)    APPearance HAZY (*)    Specific Gravity, Urine 1.036 (*)    Bilirubin Urine SMALL (*)    Ketones, ur 80 (*)    Protein, ur 100 (*)    All other components within normal limits  LIPASE, BLOOD    EKG None  Radiology No results found.  Procedures Procedures    Medications Ordered in ED Medications  metoCLOPramide  (REGLAN ) injection 10 mg (10 mg Intravenous Given 09/01/23 0848)  dextrose  5% lactated ringers  bolus 1,000 mL (0 mLs Intravenous Stopped 09/01/23 1022)  famotidine  (PEPCID ) IVPB 20 mg premix (0 mg  Intravenous Stopped 09/01/23 1610)    ED Course/ Medical Decision Making/ A&P                                 Medical Decision Making Amount and/or Complexity of Data Reviewed Labs: ordered.  Risk Prescription drug management.   Patient is around [redacted] weeks pregnant.  Nausea and vomiting.  Differential diagnosis does include causes such as gastroenteritis but I think hyperemesis gravidarum is more likely.  Will get basic blood work.  Will give IV fluids.  Will give Reglan  and Pepcid  also.  Feeling much better after fluids.  Tolerated orals.  Eager to go home.  Will discharge with short-term follow-up.        Final Clinical Impression(s) / ED Diagnoses Final diagnoses:  Hyperemesis gravidarum    Rx / DC Orders ED Discharge Orders          Ordered    metoCLOPramide  (REGLAN ) 10 MG tablet  Every 6 hours        09/01/23 1018              Mozell Arias, MD 09/01/23 1503

## 2023-09-05 ENCOUNTER — Emergency Department (HOSPITAL_COMMUNITY)
Admission: EM | Admit: 2023-09-05 | Discharge: 2023-09-05 | Disposition: A | Discharge status: Home / Self Care | Attending: Emergency Medicine | Admitting: Emergency Medicine

## 2023-09-05 ENCOUNTER — Encounter (HOSPITAL_COMMUNITY): Payer: Self-pay | Admitting: Emergency Medicine

## 2023-09-05 ENCOUNTER — Emergency Department (HOSPITAL_COMMUNITY)

## 2023-09-05 DIAGNOSIS — O26891 Other specified pregnancy related conditions, first trimester: Secondary | ICD-10-CM | POA: Insufficient documentation

## 2023-09-05 DIAGNOSIS — O219 Vomiting of pregnancy, unspecified: Secondary | ICD-10-CM | POA: Insufficient documentation

## 2023-09-05 DIAGNOSIS — O99281 Endocrine, nutritional and metabolic diseases complicating pregnancy, first trimester: Secondary | ICD-10-CM | POA: Diagnosis not present

## 2023-09-05 DIAGNOSIS — R1011 Right upper quadrant pain: Secondary | ICD-10-CM | POA: Diagnosis not present

## 2023-09-05 DIAGNOSIS — E876 Hypokalemia: Secondary | ICD-10-CM | POA: Diagnosis not present

## 2023-09-05 LAB — CBC
HCT: 36.5 % (ref 36.0–46.0)
Hemoglobin: 12.9 g/dL (ref 12.0–15.0)
MCH: 30.4 pg (ref 26.0–34.0)
MCHC: 35.3 g/dL (ref 30.0–36.0)
MCV: 85.9 fL (ref 80.0–100.0)
Platelets: 284 10*3/uL (ref 150–400)
RBC: 4.25 MIL/uL (ref 3.87–5.11)
RDW: 11.2 % — ABNORMAL LOW (ref 11.5–15.5)
WBC: 5.9 10*3/uL (ref 4.0–10.5)
nRBC: 0 % (ref 0.0–0.2)

## 2023-09-05 LAB — COMPREHENSIVE METABOLIC PANEL WITH GFR
ALT: 45 U/L — ABNORMAL HIGH (ref 0–44)
AST: 29 U/L (ref 15–41)
Albumin: 4.4 g/dL (ref 3.5–5.0)
Alkaline Phosphatase: 58 U/L (ref 38–126)
Anion gap: 12 (ref 5–15)
BUN: 9 mg/dL (ref 6–20)
CO2: 22 mmol/L (ref 22–32)
Calcium: 9.6 mg/dL (ref 8.9–10.3)
Chloride: 97 mmol/L — ABNORMAL LOW (ref 98–111)
Creatinine, Ser: 0.69 mg/dL (ref 0.44–1.00)
GFR, Estimated: >60 mL/min (ref >60)
Glucose, Bld: 86 mg/dL (ref 70–99)
Potassium: 3 mmol/L — ABNORMAL LOW (ref 3.5–5.1)
Sodium: 131 mmol/L — ABNORMAL LOW (ref 135–145)
Total Bilirubin: 1 mg/dL (ref 0.0–1.2)
Total Protein: 7.8 g/dL (ref 6.5–8.1)

## 2023-09-05 LAB — WET PREP, GENITAL
Clue Cells Wet Prep HPF POC: NONE SEEN
Sperm: NONE SEEN
Trich, Wet Prep: NONE SEEN
WBC, Wet Prep HPF POC: >=10 — AB (ref <10)
Yeast Wet Prep HPF POC: NONE SEEN

## 2023-09-05 LAB — URINALYSIS, ROUTINE W REFLEX MICROSCOPIC
Bacteria, UA: NONE SEEN
Bilirubin Urine: NEGATIVE
Glucose, UA: NEGATIVE mg/dL
Hgb urine dipstick: NEGATIVE
Ketones, ur: 80 mg/dL — AB
Leukocytes,Ua: NEGATIVE
Nitrite: NEGATIVE
Protein, ur: 30 mg/dL — AB
Specific Gravity, Urine: 1.024 (ref 1.005–1.030)
pH: 6 (ref 5.0–8.0)

## 2023-09-05 LAB — HCG, QUANTITATIVE, PREGNANCY: hCG, Beta Chain, Quant, S: 102166 m[IU]/mL — ABNORMAL HIGH (ref <5)

## 2023-09-05 LAB — LIPASE, BLOOD: Lipase: 23 U/L (ref 11–51)

## 2023-09-05 MED ORDER — SODIUM CHLORIDE 0.9 % IV BOLUS
1000.0000 mL | Freq: Once | INTRAVENOUS | Status: AC
  Administered 2023-09-05: 1000 mL via INTRAVENOUS

## 2023-09-05 MED ORDER — POTASSIUM CHLORIDE CRYS ER 20 MEQ PO TBCR
40.0000 meq | EXTENDED_RELEASE_TABLET | Freq: Once | ORAL | Status: AC
  Administered 2023-09-05: 40 meq via ORAL
  Filled 2023-09-05: qty 2

## 2023-09-05 MED ORDER — DOXYLAMINE-PYRIDOXINE 10-10 MG PO TBEC: 2.0000 | DELAYED_RELEASE_TABLET | Freq: Every day | ORAL | 0 refills | Status: DC

## 2023-09-05 MED ORDER — ONDANSETRON HCL 4 MG/2ML IJ SOLN
4.0000 mg | Freq: Once | INTRAMUSCULAR | Status: AC
Start: 2023-09-05 — End: 2023-09-05
  Administered 2023-09-05: 4 mg via INTRAVENOUS
  Filled 2023-09-05: qty 2

## 2023-09-05 NOTE — ED Provider Notes (Signed)
 [place] EMERGENCY DEPARTMENT AT [facility] Provider Note   CSN: [id number] Arrival date & time: 09/05/23  1041     History  Chief Complaint  Patient presents with   Abdominal Pain   Emesis During Pregnancy    Bethany Gallagher is a 33 y.o. female.  32 year old female presents with complaint of nausea vomiting right upper quadrant abdominal pain for the past 3 days.  LMP July 16, 2023, she 10 P5 for elective AB.  Denies vaginal bleeding or discharge, dysuria, changes in bowel habits or fever.  Abdominal surgeries include cholecystectomy.  No history of ectopic pregnancy, does report history of gonorrhea and chlamydia.  Has not seen OB for this pregnancy, has not had an ultrasound as of yet.       Home Medications Prior to Admission medications   Medication Sig Start Date End Date Taking? Authorizing Provider  Doxylamine -Pyridoxine  (DICLEGIS ) 10-10 MG TBEC Take 2 tablets by mouth at bedtime. If symptoms persist, add one tablet in the morning and one in the afternoon 09/05/23   Darlis Eisenmenger, PA-C  metoCLOPramide  (REGLAN ) 10 MG tablet Take 1 tablet (10 mg total) by mouth every 6 (six) hours. 09/01/23   Mozell Arias, MD      Allergies    Patient has no known allergies.    Review of Systems   Review of Systems  Negative except as per HPI Physical Exam Updated Vital Signs BP 113/81 (BP Location: Left Arm)   Pulse 90   Temp 98.7 F (37.1 C) (Oral)   Resp 17   LMP 07/16/2023 (Exact Date)   SpO2 100%  Physical Exam Vitals and nursing note reviewed. Exam conducted with a chaperone present.  Constitutional:      General: She is not in acute distress.    Appearance: She is well-developed. She is not diaphoretic.  HENT:     Head: Normocephalic and atraumatic.  Cardiovascular:     Rate and Rhythm: Normal rate and regular rhythm.     Heart sounds: Normal heart sounds.  Pulmonary:     Effort: Pulmonary effort is normal.     Breath sounds: Normal breath  sounds.  Abdominal:     Palpations: Abdomen is soft.     Tenderness: There is abdominal tenderness in the right upper quadrant.  Genitourinary:    Vagina: Normal.     Cervix: Normal. No cervical motion tenderness, discharge, friability or cervical bleeding.     Uterus: Normal. Enlarged. Not tender.      Adnexa: Right adnexa normal and left adnexa normal.  Skin:    General: Skin is warm and dry.  Neurological:     Mental Status: She is alert and oriented to person, place, and time.  Psychiatric:        Behavior: Behavior normal.     ED Results / Procedures / Treatments   Labs (all labs ordered are listed, but only abnormal results are displayed) Labs Reviewed  WET PREP, GENITAL - Abnormal; Notable for the following components:      Result Value   WBC, Wet Prep HPF POC >=10 (*)    All other components within normal limits  COMPREHENSIVE METABOLIC PANEL WITH GFR - Abnormal; Notable for the following components:   Sodium 131 (*)    Potassium 3.0 (*)    Chloride 97 (*)    ALT 45 (*)    All other components within normal limits  CBC - Abnormal; Notable for the following components:   RDW  11.2 (*)    All other components within normal limits  URINALYSIS, ROUTINE W REFLEX MICROSCOPIC - Abnormal; Notable for the following components:   Color, Urine AMBER (*)    APPearance CLOUDY (*)    Ketones, ur 80 (*)    Protein, ur 30 (*)    All other components within normal limits  HCG, QUANTITATIVE, PREGNANCY - Abnormal; Notable for the following components:   hCG, Beta Chain, Quant, S 102,166 (*)    All other components within normal limits  LIPASE, BLOOD  GC/CHLAMYDIA PROBE AMP ([place]) NOT AT [facility]    EKG None  Radiology US  OB Comp Less 14 Wks Result Date: 09/05/2023 CLINICAL DATA:  RUQ pain, pregnant, prior cholecystectomy EXAM: OBSTETRIC <14 WK ULTRASOUND TECHNIQUE: Transabdominal ultrasound was performed for evaluation of the gestation as well as the maternal uterus and  adnexal regions. COMPARISON:  June 25, 2020 FINDINGS: Intrauterine gestational sac: Single Yolk sac:  Present Embryo:  Present Cardiac Activity: Present Heart Rate: 118 bpm CRL:  5.3 mm 6 w 2 d                  US  EDC: 07/27/2023 Subchorionic hemorrhage: Small subchronic hematoma, measuring 8 x 4 x 3 mm. Maternal uterus/adnexae: Neither ovary was visualized or evaluated due to overlying bowel gas. No free pelvic fluid. IMPRESSION: 1. Single, live intrauterine gestation with an estimated gestational age of [redacted] weeks, 2 days. Fetal heart rate of 118 beats per minute. 2. Small subchronic hematoma measuring 8 x 4 x 3 mm. Continued close obstetric and sonographic follow-up is recommended. Electronically Signed   By: Rance Burrows M.D.   On: 09/05/2023 11:43    Procedures Procedures    Medications Ordered in ED Medications  ondansetron  (ZOFRAN ) injection 4 mg (4 mg Intravenous Given 09/05/23 1110)  sodium chloride  0.9 % bolus 1,000 mL (0 mLs Intravenous Stopped 09/05/23 1224)  potassium chloride SA (KLOR-CON M) CR tablet 40 mEq (40 mEq Oral Given 09/05/23 1222)    ED Course/ Medical Decision Making/ A&P                                 Medical Decision Making Amount and/or Complexity of Data Reviewed Labs: ordered. Radiology: ordered.  Risk Prescription drug management.   This patient presents to the ED for concern of right upper quadrant pain with nausea and vomiting, this involves an extensive number of treatment options, and is a complaint that carries with it a high risk of complications and morbidity.  The differential diagnosis includes but not limited to peptic ulcer disease, ectopic pregnancy, gastritis, pancreatitis   Co morbidities / Chronic conditions that complicate the patient evaluation  As listed per HPI   Additional history obtained:  Additional history obtained from EMR External records from outside source obtained and reviewed including prior labs for comparison   Lab  Tests:  I Ordered, and personally interpreted labs.  The pertinent results include: Quant elevated 102,166.  Urinalysis with ketones and protein, no evidence of infection.  CMP with mild hypokalemia at 3.0.  ALT minimally elevated at 45.  Lipase normal.  CBC without significant findings.  Wet prep is negative for BV, trichomoniasis, yeast.  GC chlamydia sent out.   Imaging Studies ordered:  I ordered imaging studies including OB ultrasound I independently visualized and interpreted imaging which showed IUP with fetal heart tones present I agree with the radiologist interpretation  Problem List / ED Course / Critical interventions / Medication management  33 year old female presents with complaint of right upper quadrant pain and vomiting in pregnancy.  She is taking Reglan  as previously prescribed at her ER visit without improvement in her symptoms.  She denies vaginal bleeding or abnormal discharge, changes in bowel or bladder habits, fevers or chills.  Status post cholecystectomy.  On exam, has tenderness to right upper quadrant.  Patient has not had prenatal care or ultrasound to date with this pregnancy.  Ultrasound today confirms IUP.  Her labs are overall reassuring.  Pelvic exam does not show significant cervical motion tenderness.  Patient is discharged with prescription for Diclegis  with instructions to follow-up with OB/return to ER or go to MAU for persistent problems. I ordered medication including Zofran , IV fluids, potassium for nausea and vomiting and hypokalemia Reevaluation of the patient after these medicines showed that the patient symptoms improved, tolerating p.o.'s I have reviewed the patients home medicines and have made adjustments as needed   Social Determinants of Health:  No prenatal care   Test / Admission - Considered:  Stable for discharge         Final Clinical Impression(s) / ED Diagnoses Final diagnoses:  Nausea and vomiting during pregnancy   Right upper quadrant abdominal pain  Hypokalemia    Rx / DC Orders ED Discharge Orders          Ordered    Doxylamine -Pyridoxine  (DICLEGIS ) 10-10 MG TBEC  Daily at bedtime        09/05/23 1326              Darlis Eisenmenger, PA-C 09/05/23 1346    Wynetta Heckle, MD 09/05/23 1604

## 2023-09-05 NOTE — Discharge Instructions (Addendum)
 Take Diclegis  as needed as prescribed. Follow-up with OB. Return to the emergency room or go to [facility], women's and children's unit for any pregnancy related concerns.

## 2023-09-05 NOTE — ED Triage Notes (Signed)
 Patient in today reporting right upper abd pain that started 3 days ago. N/v. [redacted] weeks pregnant.

## 2023-09-06 LAB — GC/CHLAMYDIA PROBE AMP (~~LOC~~) NOT AT ARMC
Chlamydia: NEGATIVE
Comment: NEGATIVE
Comment: NORMAL
Neisseria Gonorrhea: NEGATIVE

## 2023-09-16 DIAGNOSIS — Z419 Encounter for procedure for purposes other than remedying health state, unspecified: Secondary | ICD-10-CM | POA: Diagnosis not present

## 2023-09-24 ENCOUNTER — Other Ambulatory Visit: Payer: Self-pay

## 2023-09-24 ENCOUNTER — Emergency Department (HOSPITAL_COMMUNITY)
Admission: EM | Admit: 2023-09-24 | Discharge: 2023-09-24 | Disposition: A | Discharge status: Home / Self Care | Attending: Emergency Medicine | Admitting: Emergency Medicine

## 2023-09-24 DIAGNOSIS — O219 Vomiting of pregnancy, unspecified: Secondary | ICD-10-CM | POA: Diagnosis not present

## 2023-09-24 DIAGNOSIS — Z3A09 9 weeks gestation of pregnancy: Secondary | ICD-10-CM | POA: Diagnosis not present

## 2023-09-24 DIAGNOSIS — Z3A01 Less than 8 weeks gestation of pregnancy: Secondary | ICD-10-CM | POA: Diagnosis not present

## 2023-09-24 LAB — BASIC METABOLIC PANEL WITH GFR
Anion gap: 12 (ref 5–15)
BUN: 10 mg/dL (ref 6–20)
CO2: 24 mmol/L (ref 22–32)
Calcium: 9.9 mg/dL (ref 8.9–10.3)
Chloride: 98 mmol/L (ref 98–111)
Creatinine, Ser: 0.64 mg/dL (ref 0.44–1.00)
GFR, Estimated: >60 mL/min (ref >60)
Glucose, Bld: 106 mg/dL — ABNORMAL HIGH (ref 70–99)
Potassium: 3.1 mmol/L — ABNORMAL LOW (ref 3.5–5.1)
Sodium: 134 mmol/L — ABNORMAL LOW (ref 135–145)

## 2023-09-24 LAB — CBC WITH DIFFERENTIAL/PLATELET
Abs Immature Granulocytes: 0.02 10*3/uL (ref 0.00–0.07)
Basophils Absolute: 0 10*3/uL (ref 0.0–0.1)
Basophils Relative: 0 %
Eosinophils Absolute: 0 10*3/uL (ref 0.0–0.5)
Eosinophils Relative: 0 %
HCT: 36.7 % (ref 36.0–46.0)
Hemoglobin: 13.1 g/dL (ref 12.0–15.0)
Immature Granulocytes: 0 %
Lymphocytes Relative: 17 %
Lymphs Abs: 1.1 10*3/uL (ref 0.7–4.0)
MCH: 30 pg (ref 26.0–34.0)
MCHC: 35.7 g/dL (ref 30.0–36.0)
MCV: 84.2 fL (ref 80.0–100.0)
Monocytes Absolute: 0.6 10*3/uL (ref 0.1–1.0)
Monocytes Relative: 9 %
Neutro Abs: 5 10*3/uL (ref 1.7–7.7)
Neutrophils Relative %: 74 %
Platelets: 306 10*3/uL (ref 150–400)
RBC: 4.36 MIL/uL (ref 3.87–5.11)
RDW: 11.1 % — ABNORMAL LOW (ref 11.5–15.5)
WBC: 6.8 10*3/uL (ref 4.0–10.5)
nRBC: 0 % (ref 0.0–0.2)

## 2023-09-24 LAB — URINALYSIS, W/ REFLEX TO CULTURE (INFECTION SUSPECTED)
Bacteria, UA: NONE SEEN
Glucose, UA: >=500 mg/dL — AB
Hgb urine dipstick: NEGATIVE
Ketones, ur: 20 mg/dL — AB
Leukocytes,Ua: NEGATIVE
Nitrite: NEGATIVE
Protein, ur: 30 mg/dL — AB
Specific Gravity, Urine: 1.026 (ref 1.005–1.030)
pH: 6 (ref 5.0–8.0)

## 2023-09-24 MED ORDER — METOCLOPRAMIDE HCL 10 MG PO TABS
5.0000 mg | ORAL_TABLET | Freq: Once | ORAL | Status: AC
  Administered 2023-09-24: 5 mg via ORAL
  Filled 2023-09-24: qty 1

## 2023-09-24 MED ORDER — METOCLOPRAMIDE HCL 10 MG PO TABS
10.0000 mg | ORAL_TABLET | Freq: Four times a day (QID) | ORAL | 0 refills | Status: DC
Start: 2023-09-24 — End: 2023-11-17

## 2023-09-24 MED ORDER — POTASSIUM CHLORIDE CRYS ER 20 MEQ PO TBCR
20.0000 meq | EXTENDED_RELEASE_TABLET | Freq: Once | ORAL | Status: AC
  Administered 2023-09-24: 20 meq via ORAL
  Filled 2023-09-24: qty 1

## 2023-09-24 MED ORDER — POTASSIUM CHLORIDE ER 10 MEQ PO TBCR: 10.0000 meq | EXTENDED_RELEASE_TABLET | Freq: Every day | ORAL | 0 refills | Status: DC

## 2023-09-24 MED ORDER — METOCLOPRAMIDE HCL 5 MG/ML IJ SOLN
10.0000 mg | Freq: Once | INTRAMUSCULAR | Status: AC
  Administered 2023-09-24: 10 mg via INTRAVENOUS
  Filled 2023-09-24: qty 2

## 2023-09-24 MED ORDER — DEXTROSE 5 % IV BOLUS
500.0000 mL | Freq: Once | INTRAVENOUS | Status: AC
  Administered 2023-09-24: 500 mL via INTRAVENOUS

## 2023-09-24 MED ORDER — LACTATED RINGERS IV BOLUS
1000.0000 mL | Freq: Once | INTRAVENOUS | Status: AC
  Administered 2023-09-24: 1000 mL via INTRAVENOUS

## 2023-09-24 NOTE — Discharge Instructions (Addendum)
 While you were in the emergency room, you had blood work done that showed that your potassium was a little bit low.  I sent your prescription for potassium pills.  Take 1 pill each day.  I sent a prescription for Reglan .  It is a nausea medicine.  You can take this every 6 hours.  Please follow-up with your OB/GYN.  Return to the emergency room if you continue to have persistent vomiting, inability eat or drink.

## 2023-09-24 NOTE — ED Triage Notes (Signed)
 Patient presented to ER with nausea/vomiting, [redacted] weeks pregnant, states has been throwing up since she has been she has been pregnant.

## 2023-09-24 NOTE — ED Provider Notes (Signed)
 [place] EMERGENCY DEPARTMENT AT [facility] Provider Note   CSN: [id number] Arrival date & time: 09/24/23  1419     Patient presents with: Emesis During Pregnancy   Bethany Gallagher is a 33 y.o. female.   This is a 33 year old female, [redacted] weeks pregnant, here today for vomiting over the last several weeks.  Patient endorses being unable to keep anything down.  She did not have this issue with previous pregnancies.        Prior to Admission medications   Medication Sig Start Date End Date Taking? Authorizing Provider  metoCLOPramide  (REGLAN ) 10 MG tablet Take 1 tablet (10 mg total) by mouth every 6 (six) hours. 09/24/23  Yes Afton Horse T, DO  potassium chloride  (KLOR-CON ) 10 MEQ tablet Take 1 tablet (10 mEq total) by mouth daily for 7 days. 09/24/23 10/01/23 Yes Afton Horse T, DO  Doxylamine -Pyridoxine  (DICLEGIS ) 10-10 MG TBEC Take 2 tablets by mouth at bedtime. If symptoms persist, add one tablet in the morning and one in the afternoon 09/05/23   Darlis Eisenmenger, PA-C    Allergies: Patient has no known allergies.    Review of Systems  Updated Vital Signs BP 122/81 (BP Location: Left Arm)   Pulse (!) 115   Temp 98.3 F (36.8 C) (Oral)   Resp 18   LMP 07/16/2023 (Exact Date)   SpO2 98%   Physical Exam Vitals and nursing note reviewed.   Eyes:     Pupils: Pupils are equal, round, and reactive to light.    Cardiovascular:     Rate and Rhythm: Normal rate.  Pulmonary:     Effort: Pulmonary effort is normal.  Abdominal:     General: Abdomen is flat. There is no distension.     Palpations: Abdomen is soft.     Tenderness: There is no abdominal tenderness.   Musculoskeletal:     Cervical back: Normal range of motion.   Neurological:     General: No focal deficit present.     Mental Status: She is alert.     (all labs ordered are listed, but only abnormal results are displayed) Labs Reviewed  URINALYSIS, W/ REFLEX TO CULTURE (INFECTION  SUSPECTED) - Abnormal; Notable for the following components:      Result Value   Color, Urine AMBER (*)    Glucose, UA >=500 (*)    Bilirubin Urine SMALL (*)    Ketones, ur 20 (*)    Protein, ur 30 (*)    All other components within normal limits  CBC WITH DIFFERENTIAL/PLATELET - Abnormal; Notable for the following components:   RDW 11.1 (*)    All other components within normal limits  BASIC METABOLIC PANEL WITH GFR - Abnormal; Notable for the following components:   Sodium 134 (*)    Potassium 3.1 (*)    Glucose, Bld 106 (*)    All other components within normal limits    EKG: None  Radiology: No results found.   Procedures   Medications Ordered in the ED  metoCLOPramide  (REGLAN ) tablet 5 mg (has no administration in time range)  potassium chloride  SA (KLOR-CON  M) CR tablet 20 mEq (has no administration in time range)  metoCLOPramide  (REGLAN ) injection 10 mg (10 mg Intravenous Given 09/24/23 1719)  dextrose  5 % bolus 500 mL (500 mLs Intravenous New Bag/Given 09/24/23 1725)  lactated ringers  bolus 1,000 mL (1,000 mLs Intravenous New Bag/Given 09/24/23 1742)  Medical Decision Making 33 year old female here today with vomiting during pregnancy.  Differential diagnoses include hyperemesis gravidarum, vomiting.  Plan-will provide the patient was Reglan .  IV fluids ordered.  Will give some D5.  Will check for ketones.  Lower suspicion for acute intra-abdominal process beyond intrauterine pregnancy.  Reassessment 8 PM-patient with some trace ketones in her urine, mild hypokalemia.  She is feeling significantly better after receiving Reglan , is tolerating p.o. without any difficulty.  Will order her additional oral potassium and Reglan , discharged with a prescription for Reglan , potassium.  She will follow-up with her OB/GYN.  I considered admission for this patient, however with her symptomatic improvement and overall reassuring labs, ability to  tolerate p.o. at this point, she is appropriate for discharge.  Amount and/or Complexity of Data Reviewed Labs: ordered.  Risk Prescription drug management.        Final diagnoses:  Nausea/vomiting in pregnancy    ED Discharge Orders          Ordered    metoCLOPramide  (REGLAN ) 10 MG tablet  Every 6 hours        09/24/23 2002    potassium chloride  (KLOR-CON ) 10 MEQ tablet  Daily        09/24/23 2002               Afton Horse T, DO 09/24/23 2003

## 2023-10-16 DIAGNOSIS — Z419 Encounter for procedure for purposes other than remedying health state, unspecified: Secondary | ICD-10-CM | POA: Diagnosis not present

## 2023-11-04 ENCOUNTER — Ambulatory Visit: Admitting: *Deleted

## 2023-11-04 ENCOUNTER — Other Ambulatory Visit (INDEPENDENT_AMBULATORY_CARE_PROVIDER_SITE_OTHER): Payer: Self-pay

## 2023-11-04 VITALS — BP 128/64 | HR 96 | Wt 139.0 lb

## 2023-11-04 DIAGNOSIS — Z362 Encounter for other antenatal screening follow-up: Secondary | ICD-10-CM

## 2023-11-04 DIAGNOSIS — Z1331 Encounter for screening for depression: Secondary | ICD-10-CM

## 2023-11-04 DIAGNOSIS — Z3480 Encounter for supervision of other normal pregnancy, unspecified trimester: Secondary | ICD-10-CM | POA: Insufficient documentation

## 2023-11-04 DIAGNOSIS — Z3482 Encounter for supervision of other normal pregnancy, second trimester: Secondary | ICD-10-CM | POA: Diagnosis not present

## 2023-11-04 DIAGNOSIS — Z3A15 15 weeks gestation of pregnancy: Secondary | ICD-10-CM

## 2023-11-04 MED ORDER — BLOOD PRESSURE KIT DEVI: 1.0000 | 0 refills | Status: AC

## 2023-11-04 MED ORDER — VITAFOL GUMMIES 3.33-0.333-34.8 MG PO CHEW: 1.0000 | CHEWABLE_TABLET | Freq: Every day | ORAL | 5 refills | Status: DC

## 2023-11-04 NOTE — Patient Instructions (Signed)
[facility] has a partnership with the [employer] to provide prenatal navigation for the most needed resources in our community. In order to see how we can help connect you to these resources we need consent to contact you. Please complete the very short consent using the link below:   English Link: [email or web address]  Spanish Link: [email or web address]  

## 2023-11-04 NOTE — Progress Notes (Signed)
 New OB Intake  I connected with Bethany Gallagher  on 11/04/23 at  9:15 AM EDT by In Person Visit and verified that I am speaking with the correct person using two identifiers. Nurse is located at [facility] and pt is located at [facility].  I discussed the limitations, risks, security and privacy concerns of performing an evaluation and management service by telephone and the availability of in person appointments. I also discussed with the patient that there may be a patient responsible charge related to this service. The patient expressed understanding and agreed to proceed.  I explained I am completing New OB Intake today. We discussed EDD of TBD based on TBD. Pt is G6P5005. I reviewed her allergies, medications and Medical/Surgical/OB history.    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 Discussed the nature of our practice with multiple providers including residents and students. Due to the size of the practice, the delivering provider may not be the same as those providing prenatal care.   Patient is not interested in water birth.  MyChart/Babyscripts MyChart access verified. I explained pt will have some visits in office and some virtually. Babyscripts instructions given and order placed. Patient verifies receipt of registration text/e-mail. Account successfully created and app downloaded. If patient is a candidate for Optimized scheduling, add to sticky note.   Blood Pressure Cuff/Weight Scale Blood pressure cuff  ordered for patient to pick-up from [employer]. Explained after first prenatal appt pt will check weekly and document in Babyscripts. Patient does not have weight scale; patient may purchase if they desire to track weight weekly in Babyscripts.  Anatomy US  Explained first scheduled US  will be around 19 weeks. Anatomy US  scheduled for TBD at TBD.  Is patient a candidate for Babyscripts Optimization? No, due to Risk Factors   First visit review I reviewed new OB appt with patient. Explained pt will be seen by Dr. Zina at first visit. Discussed [name] genetic screening with patient. Requests Panorama and Horizon.. Routine prenatal labs collected at today's visit.   Last Pap Diagnosis  Date Value Ref Range Status  06/12/2020   Final   - Negative for intraepithelial lesion or malignancy (NILM)    Rocky Bethany Ober, RN 11/04/2023  11:10 AM

## 2023-11-05 LAB — CBC/D/PLT+RPR+RH+ABO+RUBIGG...
Antibody Screen: NEGATIVE
Basophils Absolute: 0 x10E3/uL (ref 0.0–0.2)
Basos: 0 %
EOS (ABSOLUTE): 0 x10E3/uL (ref 0.0–0.4)
Eos: 0 %
HCV Ab: NONREACTIVE
HIV Screen 4th Generation wRfx: NONREACTIVE
Hematocrit: 31.3 % — ABNORMAL LOW (ref 34.0–46.6)
Hemoglobin: 10 g/dL — ABNORMAL LOW (ref 11.1–15.9)
Hepatitis B Surface Ag: NEGATIVE
Immature Grans (Abs): 0 x10E3/uL (ref 0.0–0.1)
Immature Granulocytes: 0 %
Lymphocytes Absolute: 1.4 x10E3/uL (ref 0.7–3.1)
Lymphs: 17 %
MCH: 31.9 pg (ref 26.6–33.0)
MCHC: 31.9 g/dL (ref 31.5–35.7)
MCV: 100 fL — ABNORMAL HIGH (ref 79–97)
Monocytes Absolute: 0.4 x10E3/uL (ref 0.1–0.9)
Monocytes: 5 %
Neutrophils Absolute: 6 x10E3/uL (ref 1.4–7.0)
Neutrophils: 78 %
Platelets: 326 x10E3/uL (ref 150–450)
RBC: 3.13 x10E6/uL — ABNORMAL LOW (ref 3.77–5.28)
RDW: 16.8 % — ABNORMAL HIGH (ref 11.7–15.4)
RPR Ser Ql: NONREACTIVE
Rh Factor: POSITIVE
Rubella Antibodies, IGG: 1.25 {index} (ref >0.99)
WBC: 7.8 x10E3/uL (ref 3.4–10.8)

## 2023-11-05 LAB — COMPREHENSIVE METABOLIC PANEL WITH GFR
ALT: 11 IU/L (ref 0–32)
AST: 16 IU/L (ref 0–40)
Albumin: 3.5 g/dL — ABNORMAL LOW (ref 3.9–4.9)
Alkaline Phosphatase: 80 IU/L (ref 44–121)
BUN/Creatinine Ratio: 10 (ref 9–23)
BUN: 5 mg/dL — ABNORMAL LOW (ref 6–20)
Bilirubin Total: <0.2 mg/dL (ref 0.0–1.2)
CO2: 18 mmol/L — ABNORMAL LOW (ref 20–29)
Calcium: 8.5 mg/dL — ABNORMAL LOW (ref 8.7–10.2)
Chloride: 105 mmol/L (ref 96–106)
Creatinine, Ser: 0.51 mg/dL — ABNORMAL LOW (ref 0.57–1.00)
Globulin, Total: 2.6 g/dL (ref 1.5–4.5)
Glucose: 81 mg/dL (ref 70–99)
Potassium: 3.9 mmol/L (ref 3.5–5.2)
Sodium: 139 mmol/L (ref 134–144)
Total Protein: 6.1 g/dL (ref 6.0–8.5)
eGFR: 128 mL/min/1.73 (ref >59)

## 2023-11-05 LAB — HEMOGLOBIN A1C
Est. average glucose Bld gHb Est-mCnc: 91 mg/dL
Hgb A1c MFr Bld: 4.8 % (ref 4.8–5.6)

## 2023-11-05 LAB — HCV INTERPRETATION

## 2023-11-06 LAB — URINE CULTURE, OB REFLEX: Organism ID, Bacteria: NO GROWTH

## 2023-11-06 LAB — CULTURE, OB URINE

## 2023-11-08 ENCOUNTER — Ambulatory Visit: Payer: Self-pay | Admitting: Obstetrics and Gynecology

## 2023-11-16 DIAGNOSIS — Z419 Encounter for procedure for purposes other than remedying health state, unspecified: Secondary | ICD-10-CM | POA: Diagnosis not present

## 2023-11-17 ENCOUNTER — Ambulatory Visit: Admitting: Obstetrics and Gynecology

## 2023-11-17 ENCOUNTER — Other Ambulatory Visit (HOSPITAL_COMMUNITY)
Admission: RE | Admit: 2023-11-17 | Discharge: 2023-11-17 | Disposition: A | Discharge status: Home / Self Care | Source: Ambulatory Visit | Attending: Obstetrics and Gynecology | Admitting: Obstetrics and Gynecology

## 2023-11-17 VITALS — BP 95/63 | HR 103 | Wt 138.0 lb

## 2023-11-17 DIAGNOSIS — Z3480 Encounter for supervision of other normal pregnancy, unspecified trimester: Secondary | ICD-10-CM | POA: Insufficient documentation

## 2023-11-17 DIAGNOSIS — O09292 Supervision of pregnancy with other poor reproductive or obstetric history, second trimester: Secondary | ICD-10-CM

## 2023-11-17 DIAGNOSIS — Z3A17 17 weeks gestation of pregnancy: Secondary | ICD-10-CM | POA: Insufficient documentation

## 2023-11-17 DIAGNOSIS — Z3482 Encounter for supervision of other normal pregnancy, second trimester: Secondary | ICD-10-CM | POA: Diagnosis not present

## 2023-11-17 DIAGNOSIS — O09299 Supervision of pregnancy with other poor reproductive or obstetric history, unspecified trimester: Secondary | ICD-10-CM | POA: Insufficient documentation

## 2023-11-17 MED ORDER — ASPIRIN 81 MG PO CHEW: 81.0000 mg | CHEWABLE_TABLET | Freq: Every day | ORAL | 3 refills | Status: DC

## 2023-11-17 NOTE — Progress Notes (Signed)
 INITIAL PRENATAL VISIT NOTE  Subjective:  Bethany Gallagher is a 33 y.o. G6P5005 at [redacted]w[redacted]d by LMP being seen today for her initial prenatal visit.  She has an obstetric history significant for preeclampsia. She has a medical history significant for GERD.  Patient reports no complaints.  Contractions: Irritability. Vag. Bleeding: None.   . Denies leaking of fluid.    Past Medical History:  Diagnosis Date   GERD (gastroesophageal reflux disease)    Hx of chlamydia infection    Hx of gonorrhea    NSVD (normal spontaneous vaginal delivery) 11/23/2014   Pregnancy induced hypertension    2013    RUQ abdominal pain 02/2018   Vaginal Pap smear, abnormal     Past Surgical History:  Procedure Laterality Date   CHOLECYSTECTOMY N/A 02/18/2018   Procedure: LAPAROSCOPIC CHOLECYSTECTOMY;  Surgeon: Signe Mitzie LABOR, MD;  Location: [facility] OR;  Service: General;  Laterality: N/A;    OB History  Gravida Para Term Preterm AB Living  6 5 5  0 0 5  SAB IAB Ectopic Multiple Live Births  0 0 0 0 5    # Outcome Date GA Lbr Len/2nd Weight Sex Type Anes PTL Lv  6 Current           5 Term 08/08/17 [redacted]w[redacted]d 02:37 / 00:03 6 lb 12.5 oz (3.076 kg) M Vag-Spont EPI  LIV  4 Term 06/09/16 [redacted]w[redacted]d 20:16 / 00:07 6 lb 15.6 oz (3.165 kg) M Vag-Spont EPI  LIV  3 Term 11/23/14 [redacted]w[redacted]d 07:07 / 00:11 7 lb 4.4 oz (3.3 kg) M Vag-Spont EPI  LIV  2 Term 12/31/11 [redacted]w[redacted]d 03:12 / 00:10 4 lb 9.2 oz (2.075 kg) F Vag-Spont EPI  LIV  1 Term 2012 [redacted]w[redacted]d 48:00 5 lb 6 oz (2.438 kg) F Vag-Spont EPI  LIV    Social History   Socioeconomic History   Marital status: Significant Other    Spouse name: Not on file   Number of children: Not on file   Years of education: Not on file   Highest education level: Not on file  Occupational History   Not on file  Tobacco Use   Smoking status: Former    Types: Cigarettes   Smokeless tobacco: Former    Quit date: 11/05/2015  Vaping Use   Vaping status: Never Used  Substance and Sexual Activity    Alcohol use: Not Currently   Drug use: Not Currently    Types: Marijuana   Sexual activity: Yes    Partners: Male    Birth control/protection: None  Other Topics Concern   Not on file  Social History Narrative   Not on file   Social Drivers of Health   Financial Resource Strain: Not on file  Food Insecurity: Not on file  Transportation Needs: Not on file  Physical Activity: Not on file  Stress: Not on file  Social Connections: Not on file    Family History  Problem Relation Age of Onset   Healthy Mother    Healthy Father    Cancer Maternal Grandfather    Asthma Brother    Alcohol abuse Neg Hx    Diabetes Neg Hx    Heart disease Neg Hx    Hypertension Neg Hx    Stroke Neg Hx      Current Outpatient Medications:    Blood Pressure Monitoring (BLOOD PRESSURE KIT) DEVI, 1 Device by Does not apply route once a week., Disp: 1 each, Rfl: 0   Prenatal Vit-Fe Phos-FA-Omega (  VITAFOL GUMMIES) 3.33-0.333-34.8 MG CHEW, Chew 1 tablet by mouth daily., Disp: 90 tablet, Rfl: 5  No Known Allergies  Review of Systems: Negative except for what is mentioned in HPI.  Objective:   Vitals:   11/17/23 0927  BP: 95/63  Pulse: (!) 103  Weight: 138 lb (62.6 kg)    Fetal Status: Fetal Heart Rate (bpm): 144 Fundal Height: 18 cm       Physical Exam: BP 95/63   Pulse (!) 103   Wt 138 lb (62.6 kg)   LMP 07/16/2023 (Exact Date)   BMI 22.96 kg/m  CONSTITUTIONAL: Well-developed, well-nourished female in no acute distress.  NEUROLOGIC: Alert and oriented to person, place, and time. Normal reflexes, muscle tone coordination. No cranial nerve deficit noted. PSYCHIATRIC: Normal mood and affect. Normal behavior. Normal judgment and thought content. SKIN: Skin is warm and dry. No rash noted. Not diaphoretic. No erythema. No pallor. HENT:  Normocephalic, atraumatic, External right and left ear normal. Oropharynx is clear and moist EYES: Conjunctivae and EOM are normal.  NECK: Normal range  of motion, supple, no masses CARDIOVASCULAR: Normal heart rate noted, regular rhythm RESPIRATORY: Effort and breath sounds normal, no problems with respiration noted BREASTS: deferred ABDOMEN: Soft, nontender, nondistended, gravid. GU: normal appearing external female genitalia, multiparous normal appearing cervix, scant white discharge in vagina, no lesions noted Bimanual: 16 weeks sized uterus, no adnexal tenderness or palpable lesions noted MUSCULOSKELETAL: Normal range of motion. EXT:  No edema and no tenderness. 2+ distal pulses.   Assessment and Plan:  Pregnancy: G6P5005 at [redacted]w[redacted]d by LMP  1. Supervision of other normal pregnancy, antepartum (Primary) Continue routine prenatal care  - Cytology - PAP( [place]) - Cervicovaginal ancillary only( [place]) - PANORAMA PRENATAL TEST - [place] Basic Panel - AFP, Serum, Open Spina Bifida  2. [redacted] weeks gestation of pregnancy  - Cytology - PAP( [place]) - Cervicovaginal ancillary only( [place]) - PANORAMA PRENATAL TEST - [place] Basic Panel - AFP, Serum, Open Spina Bifida  3. Hx of preeclampsia  Will start baby ASA now and monitor BP   Preterm labor symptoms and general obstetric precautions including but not limited to vaginal bleeding, contractions, leaking of fluid and fetal movement were reviewed in detail with the patient.  Please refer to After Visit Summary for other counseling recommendations.   Return in about 4 weeks (around 12/15/2023) for ROB, in person.  Jerilynn DELENA Buddle 11/17/2023 9:52 AM

## 2023-11-18 LAB — CERVICOVAGINAL ANCILLARY ONLY
Chlamydia: NEGATIVE
Comment: NEGATIVE
Comment: NORMAL
Neisseria Gonorrhea: NEGATIVE

## 2023-11-19 LAB — AFP, SERUM, OPEN SPINA BIFIDA
AFP MoM: 1.32
AFP Value: 61.8 ng/mL
Gest. Age on Collection Date: 17.5 wk
Maternal Age At EDD: 31.5 a
OSBR Risk 1 IN: 9058
Test Results:: NEGATIVE
Weight: 138 [lb_av]

## 2023-11-19 LAB — CYTOLOGY - PAP
Comment: NEGATIVE
Diagnosis: NEGATIVE
High risk HPV: NEGATIVE

## 2023-11-22 LAB — PANORAMA PRENATAL TEST FULL PANEL:PANORAMA TEST PLUS 5 ADDITIONAL MICRODELETIONS: FETAL FRACTION: 5.2

## 2023-11-23 ENCOUNTER — Ambulatory Visit: Payer: Self-pay | Admitting: Obstetrics and Gynecology

## 2023-11-23 DIAGNOSIS — Z3480 Encounter for supervision of other normal pregnancy, unspecified trimester: Secondary | ICD-10-CM

## 2023-11-25 LAB — HORIZON CUSTOM: REPORT SUMMARY: NEGATIVE

## 2023-11-30 DIAGNOSIS — O0940 Supervision of pregnancy with grand multiparity, unspecified trimester: Secondary | ICD-10-CM | POA: Insufficient documentation

## 2023-12-14 ENCOUNTER — Ambulatory Visit

## 2023-12-14 ENCOUNTER — Other Ambulatory Visit

## 2023-12-14 DIAGNOSIS — O0942 Supervision of pregnancy with grand multiparity, second trimester: Secondary | ICD-10-CM

## 2023-12-14 DIAGNOSIS — Z3480 Encounter for supervision of other normal pregnancy, unspecified trimester: Secondary | ICD-10-CM

## 2023-12-15 ENCOUNTER — Encounter: Admitting: Obstetrics and Gynecology

## 2023-12-17 DIAGNOSIS — Z419 Encounter for procedure for purposes other than remedying health state, unspecified: Secondary | ICD-10-CM | POA: Diagnosis not present

## 2023-12-29 ENCOUNTER — Encounter: Admitting: Obstetrics and Gynecology

## 2024-01-11 ENCOUNTER — Encounter: Payer: Self-pay | Admitting: Obstetrics

## 2024-01-11 ENCOUNTER — Ambulatory Visit (INDEPENDENT_AMBULATORY_CARE_PROVIDER_SITE_OTHER): Admitting: Obstetrics

## 2024-01-11 VITALS — BP 110/69 | HR 90 | Wt 149.1 lb

## 2024-01-11 DIAGNOSIS — O09299 Supervision of pregnancy with other poor reproductive or obstetric history, unspecified trimester: Secondary | ICD-10-CM

## 2024-01-11 DIAGNOSIS — O0990 Supervision of high risk pregnancy, unspecified, unspecified trimester: Secondary | ICD-10-CM | POA: Diagnosis not present

## 2024-01-11 NOTE — Progress Notes (Signed)
 ROB. Declines flu vaccine. No questions or concerns.

## 2024-01-11 NOTE — Progress Notes (Signed)
 Subjective:  Bethany Gallagher is a 33 y.o. G6P5005 at [redacted]w[redacted]d being seen today for ongoing prenatal care.  She is currently monitored for the following issues for this high-risk pregnancy and has Mild tetrahydrocannabinol (THC) abuse; Hx of chlamydia infection; GERD (gastroesophageal reflux disease); Supervision of other normal pregnancy, antepartum; Hx of preeclampsia, prior pregnancy, currently pregnant; and Grand multiparity with current pregnancy on their problem list.  Patient reports no complaints.  Contractions: Irritability Garlan Irving).  .  Movement: Present. Denies leaking of fluid.   The following portions of the patient's history were reviewed and updated as appropriate: allergies, current medications, past family history, past medical history, past social history, past surgical history and problem list. Problem list updated.  Objective:   Vitals:   01/11/24 1318  BP: 110/69  Pulse: 90  Weight: 149 lb 1.6 oz (67.6 kg)    Fetal Status: Fetal Heart Rate (bpm): 140   Movement: Present     General:  Alert, oriented and cooperative. Patient is in no acute distress.  Skin: Skin is warm and dry. No rash noted.   Cardiovascular: Normal heart rate noted  Respiratory: Normal respiratory effort, no problems with respiration noted  Abdomen: Soft, gravid, appropriate for gestational age. Pain/Pressure: Present     Pelvic:  Cervical exam deferred        Extremities: Normal range of motion.  Edema: None  Mental Status: Normal mood and affect. Normal behavior. Normal judgment and thought content.   Urinalysis:      Assessment and Plan:  Pregnancy: G6P5005 at [redacted]w[redacted]d  1. Supervision of high risk pregnancy, antepartum (Primary)  2. Hx of preeclampsia, prior pregnancy, currently pregnant - taking Baby ASA   Preterm labor symptoms and general obstetric precautions including but not limited to vaginal bleeding, contractions, leaking of fluid and fetal movement were reviewed in detail  with the patient. Please refer to After Visit Summary for other counseling recommendations.   Return in about 3 weeks (around 02/01/2024) for ROB, 2 hour OGTT.   Rudy Carlin LABOR, MD 01/11/2024

## 2024-02-07 ENCOUNTER — Other Ambulatory Visit

## 2024-02-07 ENCOUNTER — Encounter: Admitting: Obstetrics and Gynecology

## 2024-02-07 ENCOUNTER — Encounter: Payer: Self-pay | Admitting: Obstetrics and Gynecology

## 2024-02-07 DIAGNOSIS — Z3480 Encounter for supervision of other normal pregnancy, unspecified trimester: Secondary | ICD-10-CM

## 2024-02-07 DIAGNOSIS — Z3A29 29 weeks gestation of pregnancy: Secondary | ICD-10-CM

## 2024-03-15 ENCOUNTER — Encounter: Admitting: Physician Assistant

## 2024-03-17 ENCOUNTER — Other Ambulatory Visit: Payer: Self-pay

## 2024-03-17 ENCOUNTER — Encounter (HOSPITAL_COMMUNITY): Payer: Self-pay | Admitting: Obstetrics and Gynecology

## 2024-03-17 ENCOUNTER — Inpatient Hospital Stay (HOSPITAL_COMMUNITY)
Admission: AD | Admit: 2024-03-17 | Discharge: 2024-03-17 | Disposition: A | Discharge status: Home / Self Care | Attending: Obstetrics and Gynecology | Admitting: Obstetrics and Gynecology

## 2024-03-17 ENCOUNTER — Inpatient Hospital Stay (HOSPITAL_COMMUNITY)

## 2024-03-17 DIAGNOSIS — O09293 Supervision of pregnancy with other poor reproductive or obstetric history, third trimester: Secondary | ICD-10-CM | POA: Diagnosis not present

## 2024-03-17 DIAGNOSIS — R42 Dizziness and giddiness: Secondary | ICD-10-CM

## 2024-03-17 DIAGNOSIS — Z3A35 35 weeks gestation of pregnancy: Secondary | ICD-10-CM

## 2024-03-17 DIAGNOSIS — Z363 Encounter for antenatal screening for malformations: Secondary | ICD-10-CM

## 2024-03-17 DIAGNOSIS — O0933 Supervision of pregnancy with insufficient antenatal care, third trimester: Secondary | ICD-10-CM

## 2024-03-17 DIAGNOSIS — Z3A34 34 weeks gestation of pregnancy: Secondary | ICD-10-CM | POA: Diagnosis not present

## 2024-03-17 DIAGNOSIS — O99013 Anemia complicating pregnancy, third trimester: Secondary | ICD-10-CM | POA: Diagnosis not present

## 2024-03-17 DIAGNOSIS — Z419 Encounter for procedure for purposes other than remedying health state, unspecified: Secondary | ICD-10-CM | POA: Diagnosis not present

## 2024-03-17 DIAGNOSIS — O26893 Other specified pregnancy related conditions, third trimester: Secondary | ICD-10-CM | POA: Diagnosis not present

## 2024-03-17 LAB — COMPREHENSIVE METABOLIC PANEL WITH GFR
ALT: 37 U/L (ref 0–44)
AST: 33 U/L (ref 15–41)
Albumin: 2.5 g/dL — ABNORMAL LOW (ref 3.5–5.0)
Alkaline Phosphatase: 158 U/L — ABNORMAL HIGH (ref 38–126)
Anion gap: 8 (ref 5–15)
BUN: 7 mg/dL (ref 6–20)
CO2: 22 mmol/L (ref 22–32)
Calcium: 8.6 mg/dL — ABNORMAL LOW (ref 8.9–10.3)
Chloride: 104 mmol/L (ref 98–111)
Creatinine, Ser: 0.6 mg/dL (ref 0.44–1.00)
GFR, Estimated: >60 mL/min (ref >60)
Glucose, Bld: 97 mg/dL (ref 70–99)
Potassium: 3.7 mmol/L (ref 3.5–5.1)
Sodium: 134 mmol/L — ABNORMAL LOW (ref 135–145)
Total Bilirubin: 0.3 mg/dL (ref 0.0–1.2)
Total Protein: 6.3 g/dL — ABNORMAL LOW (ref 6.5–8.1)

## 2024-03-17 LAB — CBC
HCT: 30.8 % — ABNORMAL LOW (ref 36.0–46.0)
Hemoglobin: 10.3 g/dL — ABNORMAL LOW (ref 12.0–15.0)
MCH: 29.6 pg (ref 26.0–34.0)
MCHC: 33.4 g/dL (ref 30.0–36.0)
MCV: 88.5 fL (ref 80.0–100.0)
Platelets: 262 K/uL (ref 150–400)
RBC: 3.48 MIL/uL — ABNORMAL LOW (ref 3.87–5.11)
RDW: 13.2 % (ref 11.5–15.5)
WBC: 6.3 K/uL (ref 4.0–10.5)
nRBC: 0.5 % — ABNORMAL HIGH (ref 0.0–0.2)

## 2024-03-17 LAB — URINALYSIS, ROUTINE W REFLEX MICROSCOPIC
Bilirubin Urine: NEGATIVE
Glucose, UA: NEGATIVE mg/dL
Hgb urine dipstick: NEGATIVE
Ketones, ur: NEGATIVE mg/dL
Leukocytes,Ua: NEGATIVE
Nitrite: NEGATIVE
Protein, ur: NEGATIVE mg/dL
Specific Gravity, Urine: 1.017 (ref 1.005–1.030)
pH: 6 (ref 5.0–8.0)

## 2024-03-17 LAB — HEMOGLOBIN A1C
Hgb A1c MFr Bld: 5.1 % (ref 4.8–5.6)
Mean Plasma Glucose: 99.67 mg/dL

## 2024-03-17 LAB — ABO/RH: ABO/RH(D): O POS

## 2024-03-17 LAB — HEPATITIS C ANTIBODY: HCV Ab: NONREACTIVE

## 2024-03-17 LAB — SYPHILIS: RPR W/REFLEX TO RPR TITER AND TREPONEMAL ANTIBODIES, TRADITIONAL SCREENING AND DIAGNOSIS ALGORITHM: RPR Ser Ql: NONREACTIVE

## 2024-03-17 LAB — HEPATITIS B SURFACE ANTIGEN: Hepatitis B Surface Ag: NONREACTIVE

## 2024-03-17 LAB — HIV ANTIBODY (ROUTINE TESTING W REFLEX): HIV Screen 4th Generation wRfx: NONREACTIVE

## 2024-03-17 MED ORDER — FERROUS SULFATE 325 (65 FE) MG PO TABS: 325.0000 mg | ORAL_TABLET | ORAL | 0 refills | Status: DC

## 2024-03-17 NOTE — MAU Provider Note (Signed)
 History     CSN: [id number]  Arrival date and time: 03/17/24 9086   Event Date/Time   First Provider Initiated Contact with Patient 03/17/24 1002      Chief Complaint  Patient presents with   Contractions   HPI Bethany Gallagher is a 33 y.o. G6P5005 at [redacted]w[redacted]d who presents with dizziness. Reports intermittent dizziness for the last month. Has episodes where she feels hot & dizzy. Symptoms resolve after cooling down. Denies headache, palpitations, CP, SOB, n/v/d. Denies abdominal pain, vaginal bleeding, or LOF. Reports good fetal movement. Has not had prenatal care since 17 weeks due to transportation issues but states she now has her car back.   OB History     Gravida  6   Para  5   Term  5   Preterm  0   AB  0   Living  5      SAB  0   IAB  0   Ectopic  0   Multiple  0   Live Births  5           Past Medical History:  Diagnosis Date   GERD (gastroesophageal reflux disease)    Hx of chlamydia infection    Hx of gonorrhea    Pregnancy induced hypertension    2013    Vaginal Pap smear, abnormal     Past Surgical History:  Procedure Laterality Date   CHOLECYSTECTOMY N/A 02/18/2018   Procedure: LAPAROSCOPIC CHOLECYSTECTOMY;  Surgeon: Signe Mitzie LABOR, MD;  Location: [facility] OR;  Service: General;  Laterality: N/A;    Family History  Problem Relation Age of Onset   Healthy Mother    Healthy Father    Cancer Maternal Grandfather    Asthma Brother    Alcohol abuse Neg Hx    Diabetes Neg Hx    Heart disease Neg Hx    Hypertension Neg Hx    Stroke Neg Hx     Social History[1]  Allergies: Allergies[2]  No medications prior to admission.    Review of Systems  All other systems reviewed and are negative.  Physical Exam   Blood pressure 117/74, pulse (!) 110, temperature (!) 97.4 F (36.3 C), temperature source Oral, resp. rate 18, height 5' 5 (1.651 m), weight 72.1 kg, last menstrual period 07/16/2023, SpO2 98%.  Physical Exam Vitals and  nursing note reviewed. Exam conducted with a chaperone present.  Constitutional:      General: She is not in acute distress.    Appearance: Normal appearance. She is not ill-appearing.  HENT:     Head: Normocephalic and atraumatic.  Eyes:     General: No scleral icterus.    Pupils: Pupils are equal, round, and reactive to light.  Pulmonary:     Effort: Pulmonary effort is normal. No respiratory distress.  Abdominal:     Tenderness: There is no abdominal tenderness.     Comments: gravid  Skin:    General: Skin is warm and dry.  Neurological:     Mental Status: She is alert.    NST:  Baseline: 135 bpm, Variability: Good {> 6 bpm), Accelerations: Reactive, and Decelerations: Absent  MAU Course  Procedures Results for orders placed or performed during the hospital encounter of 03/17/24 (from the past 24 hours)  Urinalysis, Routine w reflex microscopic -Urine, Clean Catch     Status: Abnormal   Collection Time: 03/17/24 10:00 AM  Result Value Ref Range   Color, Urine YELLOW YELLOW   APPearance  HAZY (A) CLEAR   Specific Gravity, Urine 1.017 1.005 - 1.030   pH 6.0 5.0 - 8.0   Glucose, UA NEGATIVE NEGATIVE mg/dL   Hgb urine dipstick NEGATIVE NEGATIVE   Bilirubin Urine NEGATIVE NEGATIVE   Ketones, ur NEGATIVE NEGATIVE mg/dL   Protein, ur NEGATIVE NEGATIVE mg/dL   Nitrite NEGATIVE NEGATIVE   Leukocytes,Ua NEGATIVE NEGATIVE  ABO/Rh     Status: None   Collection Time: 03/17/24 10:14 AM  Result Value Ref Range   ABO/RH(D)      O POS Performed at [facility], [address] [place], [place] [address]   CBC     Status: Abnormal   Collection Time: 03/17/24 10:18 AM  Result Value Ref Range   WBC 6.3 4.0 - 10.5 K/uL   RBC 3.48 (L) 3.87 - 5.11 MIL/uL   Hemoglobin 10.3 (L) 12.0 - 15.0 g/dL   HCT 69.1 (L) 63.9 - 53.9 %   MCV 88.5 80.0 - 100.0 fL   MCH 29.6 26.0 - 34.0 pg   MCHC 33.4 30.0 - 36.0 g/dL   RDW 86.7 88.4 - 84.4 %   Platelets 262 150 - 400 K/uL   nRBC 0.5 (H)  0.0 - 0.2 %  RPR W/RFLX TO RPR TITER, TREPONEMAL AB, SCREEN AND DIAGNOSIS     Status: None   Collection Time: 03/17/24 10:18 AM  Result Value Ref Range   RPR Ser Ql NON REACTIVE NON REACTIVE  HIV Antibody (routine testing w rflx)     Status: None   Collection Time: 03/17/24 10:18 AM  Result Value Ref Range   HIV Screen 4th Generation wRfx Non Reactive Non Reactive  Hepatitis B surface antigen     Status: None   Collection Time: 03/17/24 10:18 AM  Result Value Ref Range   Hepatitis B Surface Ag NON REACTIVE NON REACTIVE  Hemoglobin A1c     Status: None   Collection Time: 03/17/24 10:18 AM  Result Value Ref Range   Hgb A1c MFr Bld 5.1 4.8 - 5.6 %   Mean Plasma Glucose 99.67 mg/dL  Hepatitis C antibody     Status: None   Collection Time: 03/17/24 10:18 AM  Result Value Ref Range   HCV Ab NON REACTIVE NON REACTIVE  Comprehensive metabolic panel with GFR     Status: Abnormal   Collection Time: 03/17/24 10:18 AM  Result Value Ref Range   Sodium 134 (L) 135 - 145 mmol/L   Potassium 3.7 3.5 - 5.1 mmol/L   Chloride 104 98 - 111 mmol/L   CO2 22 22 - 32 mmol/L   Glucose, Bld 97 70 - 99 mg/dL   BUN 7 6 - 20 mg/dL   Creatinine, Ser 9.39 0.44 - 1.00 mg/dL   Calcium 8.6 (L) 8.9 - 10.3 mg/dL   Total Protein 6.3 (L) 6.5 - 8.1 g/dL   Albumin 2.5 (L) 3.5 - 5.0 g/dL   AST 33 15 - 41 U/L   ALT 37 0 - 44 U/L   Alkaline Phosphatase 158 (H) 38 - 126 U/L   Total Bilirubin 0.3 0.0 - 1.2 mg/dL   GFR, Estimated >39 >39 mL/min   Anion gap 8 5 - 15   US  MFM OB COMP + 14 WK Result Date: 03/17/2024 ----------------------------------------------------------------------  OBSTETRICS REPORT                       (Signed Final 03/17/2024 01:45 pm) ---------------------------------------------------------------------- Patient Info  ID #:  [id number]                          D.O.B.:  1990-05-28 (33 yrs)(F)  Name:       Bethany Gallagher              Visit Date: 03/17/2024 11:08 am  ---------------------------------------------------------------------- Performed By  Attending:        Steffan Keys MD         Referred By:      [facility] MAU/Triage  Performed By:     Emelia Coombs BS,       [facility] ---------------------------------------------------------------------- Orders  #  Description                           Code        Ordered By  1  US  MFM OB COMP + 14 WK                23194.98    ROCKY SATTERFIELD ----------------------------------------------------------------------  #  Order #                     Accession #                Episode #  1  [id number]                   [id number]                 [id number] ---------------------------------------------------------------------- Indications  Insufficient Prenatal Care                     O09.30  Poor obstetric history: Previous               O09.299  preeclampsia / eclampsia/gestational HTN  [redacted] weeks gestation of pregnancy                Z3A.34  Encounter for antenatal screening for          Z36.3  malformations ---------------------------------------------------------------------- Fetal Evaluation  Num Of Fetuses:         1  Fetal Heart Rate(bpm):  138  Cardiac Activity:       Observed  Presentation:           Cephalic  Placenta:               Posterior Fundal Right  P. Cord Insertion:      Visualized  Amniotic Fluid  AFI FV:      Within normal limits  AFI Sum(cm)     %Tile       Largest Pocket(cm)  13.7            46          4.2  RUQ(cm)       RLQ(cm)       LUQ(cm)        LLQ(cm)  3.6           3  2.9            4.2 ---------------------------------------------------------------------- Biometry  BPD:      77.6  mm     G. Age:  31w 1d        < 1  %    CI:         73.5   %    70 - 86                                                          FL/HC:      22.4   %    19.4 - 21.8  HC:      287.6  mm     G. Age:  31w 4d        < 1  %    HC/AC:       0.94        0.96 - 1.11  AC:      305.7  mm     G. Age:  34w 4d         69  %    FL/BPD:     82.9   %    71 - 87  FL:       64.3  mm     G. Age:  33w 1d         20  %    FL/AC:      21.0   %    20 - 24  HUM:      54.7  mm     G. Age:  31w 6d         13  %  CER:      46.2  mm     G. Age:  35w 3d         52  %  LV:        3.9  mm  CM:        6.7  mm  Est. FW:    2211  gm    4 lb 14 oz      29  %  Est. FW at 39 Wks:       3194  gm     7 lb 1 oz ---------------------------------------------------------------------- OB History  Gravidity:    6         Term:   5        Prem:   0        SAB:   0  TOP:          0       Ectopic:  0        Living: 5 ---------------------------------------------------------------------- Gestational Age  LMP:           35w 0d        Date:  07/16/23                  EDD:   04/21/24  U/S Today:     32w 4d                                        EDD:   05/08/24  Best:  34w 0d     Det. By:  Early Ultrasound         EDD:   04/28/24                                      (09/05/23) ---------------------------------------------------------------------- Anatomy  Cranium:               Appears normal         Aortic Arch:            Appears normal  Cavum:                 Appears normal         Ductal Arch:            Not well visualized  Ventricles:            Appears normal         Diaphragm:              Appears normal  Choroid Plexus:        Appears normal         Stomach:                Appears normal, left                                                                        sided  Cerebellum:            Appears normal         Abdomen:                Appears normal  Posterior Fossa:       Appears normal         Abdominal Wall:         Not well visualized  Nuchal Fold:           Not applicable (>20    Cord Vessels:           Appears normal ([redacted]                         wks GA)                                        vessel cord)  Face:                  Appears normal         Kidneys:                 Appear normal                         (orbits and profile)  Lips:                  Appears normal         Bladder:                Appears normal  Thoracic:  Appears normal         Spine:                  Not well visualized  Heart:                 Appears normal         Upper Extremities:      Visualized limited                         (4CH, axis, and                         situs)  RVOT:                  Appears normal         Lower Extremities:      Visualized limited  LVOT:                  Appears normal  Other:  Technically difficult due to advanced GA and fetal position. ---------------------------------------------------------------------- Cervix Uterus Adnexa  Cervix  Length:              3  cm.  Normal appearance by transabdominal scan  Uterus  No abnormality visualized.  Right Ovary  Within normal limits.  Left Ovary  Within normal limits.  Cul De Sac  No free fluid seen.  Adnexa  No abnormality visualized ---------------------------------------------------------------------- Comments  This patient presented to the MAU due to frequent  contractions.  She has had limited prenatal care in her  current pregnancy.  Sonographic findings  Single intrauterine pregnancy at 34w 0d.  Fetal cardiac activity:  Observed and appears normal.  Presentation: Cephalic.  The views of the fetal anatomy were limited today due to her  advanced gestational age.  What was visualized today  appeared within normal limits.  Fetal biometry shows the estimated fetal weight of 4 lb 14 oz,  2211 grams (29%).  Amniotic fluid: Within normal limits.  MVP: 4.2 cm.  AFI: 13.7  cm.  Placenta: Posterior Fundal Right.  Cervical length: 3 cm.  The fetal biometry measurements obtained today are  consistent with an [facility] of April 28, 2024.  There are limitations of prenatal ultrasound such as the  inability to detect certain abnormalities due to poor  visualization. Various factors such as fetal position,  gestational age and maternal  body habitus may increase the  difficulty in visualizing the fetal anatomy. ----------------------------------------------------------------------                   Steffan Keys, MD Electronically Signed Final Report   03/17/2024 01:45 pm ----------------------------------------------------------------------    MDM   Assessment and Plan   1. Dizziness  -Normal orthostatic vital signs. Currently asymptomatic.  -Reviewed management of symptoms & reasons to return to MAU  2. Limited prenatal care in third trimester  -Anatomy scan today -Has appt next week & will keep appt -Prenatal labs ordered  3. Anemia affecting pregnancy in third trimester  -Mild anemia. Oral iron ordered  4. [redacted] weeks gestation of pregnancy      Rocky Satterfield 03/17/2024, 8:22 PM      [1]  Social History Tobacco Use   Smoking status: Former    Types: Cigarettes   Smokeless tobacco: Former    Quit date: 11/05/2015  Vaping Use   Vaping status: Never Used  Substance Use Topics   Alcohol  use: Not Currently   Drug use: Not Currently    Types: Marijuana    Comment: august  [2] No Known Allergies

## 2024-03-17 NOTE — MAU Note (Signed)
 Bethany Gallagher is a 33 y.o. at [redacted]w[redacted]d here in MAU reporting: last night ctxs 5-7 mins apart throughout the night, they have calmed down now. Denies any LOF or VB. Reports +FM States for a few weeks she has been feeling extra hot and gets dizzy, random occurences. The only way to make it better is to lie down and have cold air blowing on her. Denies any N/V/D.   Onset of complaint: ongoing  Pain score: 4 generalized  Vitals:   03/17/24 0940  BP: 117/74  Pulse: (!) 110  Resp: 18  Temp: (!) 97.4 F (36.3 C)  SpO2: 98%     FHT:148 Lab orders placed from triage:

## 2024-03-18 LAB — RUBELLA SCREEN: Rubella: 1.04 {index} (ref >0.99)

## 2024-03-21 ENCOUNTER — Ambulatory Visit: Admitting: Obstetrics

## 2024-03-21 ENCOUNTER — Encounter: Payer: Self-pay | Admitting: Obstetrics

## 2024-03-21 VITALS — BP 114/74 | HR 100 | Wt 165.0 lb

## 2024-03-21 DIAGNOSIS — O0990 Supervision of high risk pregnancy, unspecified, unspecified trimester: Secondary | ICD-10-CM | POA: Diagnosis not present

## 2024-03-21 DIAGNOSIS — F1210 Cannabis abuse, uncomplicated: Secondary | ICD-10-CM | POA: Diagnosis not present

## 2024-03-21 DIAGNOSIS — Z8619 Personal history of other infectious and parasitic diseases: Secondary | ICD-10-CM | POA: Diagnosis not present

## 2024-03-21 DIAGNOSIS — O99013 Anemia complicating pregnancy, third trimester: Secondary | ICD-10-CM

## 2024-03-21 DIAGNOSIS — O09299 Supervision of pregnancy with other poor reproductive or obstetric history, unspecified trimester: Secondary | ICD-10-CM

## 2024-03-21 MED ORDER — ASPIRIN 81 MG PO CHEW: 81.0000 mg | CHEWABLE_TABLET | Freq: Every day | ORAL | 3 refills | Status: DC

## 2024-03-21 MED ORDER — ACCRUFER 30 MG PO CAPS: 1.0000 | ORAL_CAPSULE | Freq: Two times a day (BID) | ORAL | 3 refills | Status: DC

## 2024-03-21 MED ORDER — VITAFOL GUMMIES 3.33-0.333-34.8 MG PO CHEW: 3.0000 | CHEWABLE_TABLET | Freq: Every day | ORAL | 5 refills | Status: DC

## 2024-03-21 NOTE — Progress Notes (Signed)
 Pt went to MAU last Friday for contractions; she was told dilation of 3cm  Pt would like to discuss watery discharge with provider.

## 2024-03-21 NOTE — Progress Notes (Signed)
 Subjective:  Bethany Gallagher is a 33 y.o. G6P5005 at [redacted]w[redacted]d being seen today for ongoing prenatal care.  She is currently monitored for the following issues for this high-risk pregnancy and has Mild tetrahydrocannabinol (THC) abuse; Hx of chlamydia infection; GERD (gastroesophageal reflux disease); Supervision of other normal pregnancy, antepartum; Hx of preeclampsia, prior pregnancy, currently pregnant; and Grand multiparity with current pregnancy on their problem list.  Patient reports no complaints.  Contractions: Irregular. Vag. Bleeding: None.  Movement: Present. Denies leaking of fluid.   The following portions of the patient's history were reviewed and updated as appropriate: allergies, current medications, past family history, past medical history, past social history, past surgical history and problem list. Problem list updated.  Objective:   Vitals:   03/21/24 1433  BP: 114/74  Pulse: 100  Weight: 165 lb (74.8 kg)    Fetal Status: Fetal Heart Rate (bpm): 140   Movement: Present     General:  Alert, oriented and cooperative. Patient is in no acute distress.  Skin: Skin is warm and dry. No rash noted.   Cardiovascular: Normal heart rate noted  Respiratory: Normal respiratory effort, no problems with respiration noted  Abdomen: Soft, gravid, appropriate for gestational age. Pain/Pressure: Present     Pelvic:  Cervical exam deferred        Extremities: Normal range of motion.  Edema: None  Mental Status: Normal mood and affect. Normal behavior. Normal judgment and thought content.   Urinalysis:      Assessment and Plan:  Pregnancy: G6P5005 at [redacted]w[redacted]d  1. Supervision of high risk pregnancy, antepartum (Primary) Rx: - Prenatal Vit-Fe Phos-FA-Omega (VITAFOL  GUMMIES) 3.33-0.333-34.8 MG CHEW; Chew 3 tablets by mouth daily.  Dispense: 90 tablet; Refill: 5  2. Hx of preeclampsia, prior pregnancy, currently pregnant Rx: - aspirin  81 MG chewable tablet; Chew 1 tablet (81 mg total)  by mouth daily.  Dispense: 90 tablet; Refill: 3  3. Hx of chlamydia infection, treated - TOC at 36 weeks  4. Mild tetrahydrocannabinol (THC) abuse - cessation encouraged  5. Anemia affecting pregnancy in third trimester Rx: - Ferric Maltol  (ACCRUFER ) 30 MG CAPS; Take 1 capsule (30 mg total) by mouth 2 (two) times daily before a meal. Take 2 hrs before, or 2 hrs after a meal.  Dispense: 60 capsule; Refill: 3    Preterm labor symptoms and general obstetric precautions including but not limited to vaginal bleeding, contractions, leaking of fluid and fetal movement were reviewed in detail with the patient. Please refer to After Visit Summary for other counseling recommendations.   Return in about 1 week (around 03/28/2024) for ROB.   Rudy Carlin LABOR, MD 03/21/2024

## 2024-03-22 ENCOUNTER — Encounter (HOSPITAL_COMMUNITY): Payer: Self-pay | Admitting: Obstetrics and Gynecology

## 2024-03-22 ENCOUNTER — Other Ambulatory Visit: Payer: Self-pay

## 2024-03-22 ENCOUNTER — Inpatient Hospital Stay (HOSPITAL_COMMUNITY)
Admission: AD | Admit: 2024-03-22 | Discharge: 2024-03-22 | Disposition: A | Discharge status: Home / Self Care | Attending: Obstetrics and Gynecology | Admitting: Obstetrics and Gynecology

## 2024-03-22 DIAGNOSIS — O479 False labor, unspecified: Secondary | ICD-10-CM

## 2024-03-22 DIAGNOSIS — O09293 Supervision of pregnancy with other poor reproductive or obstetric history, third trimester: Secondary | ICD-10-CM | POA: Insufficient documentation

## 2024-03-22 DIAGNOSIS — Z3A35 35 weeks gestation of pregnancy: Secondary | ICD-10-CM

## 2024-03-22 DIAGNOSIS — Z3689 Encounter for other specified antenatal screening: Secondary | ICD-10-CM | POA: Diagnosis not present

## 2024-03-22 DIAGNOSIS — O4703 False labor before 37 completed weeks of gestation, third trimester: Secondary | ICD-10-CM | POA: Diagnosis not present

## 2024-03-22 DIAGNOSIS — Z3493 Encounter for supervision of normal pregnancy, unspecified, third trimester: Secondary | ICD-10-CM

## 2024-03-22 LAB — POCT FERN TEST: POCT Fern Test: NEGATIVE

## 2024-03-22 LAB — RUPTURE OF MEMBRANE (ROM)PLUS: Rom Plus: NEGATIVE

## 2024-03-22 LAB — WET PREP, GENITAL
Clue Cells Wet Prep HPF POC: NONE SEEN
Sperm: NONE SEEN
Trich, Wet Prep: NONE SEEN
WBC, Wet Prep HPF POC: >=10 — AB (ref <10)
Yeast Wet Prep HPF POC: NONE SEEN

## 2024-03-22 NOTE — MAU Provider Note (Signed)
 Chief Complaint:  Rupture of Membranes   HPI    Bethany Gallagher is a 33 y.o. G6P5005 at [redacted]w[redacted]d who presents to maternity admissions reporting that she has had some leaking of fluid that began at 08 30 yesterday morning 03/21/2024.  She reports the leaking has been intermittent since yesterday.  She denies any vaginal bleeding or contractions and reports good fetal movements.   Pregnancy Course: Bethany Gallagher Her pregnancy is complicated by THC use, history of chlamydia infection, GERD, history of preeclampsia in a prior pregnancy, grand multiparity   Past Medical History:  Diagnosis Date   GERD (gastroesophageal reflux disease)    Hx of chlamydia infection    Hx of gonorrhea    Pregnancy induced hypertension    2013    Vaginal Pap smear, abnormal    OB History  Gravida Para Term Preterm AB Living  6 5 5  0 0 5  SAB IAB Ectopic Multiple Live Births  0 0 0 0 5    # Outcome Date GA Lbr Len/2nd Weight Sex Type Anes PTL Lv  6 Current           5 Term 08/08/17 [redacted]w[redacted]d 02:37 / 00:03 3076 g Bethany Gallagher EPI  LIV  4 Term 06/09/16 [redacted]w[redacted]d 20:16 / 00:07 3165 g M Vag-Spont EPI  LIV  3 Term 11/23/14 [redacted]w[redacted]d 07:07 / 00:11 3300 g M Vag-Spont EPI  LIV  2 Term 12/31/11 [redacted]w[redacted]d 03:12 / 00:10 2075 g F Vag-Spont EPI  LIV  1 Term 2012 [redacted]w[redacted]d 48:00 2438 g F Vag-Spont EPI  LIV   Past Surgical History:  Procedure Laterality Date   CHOLECYSTECTOMY N/A 02/18/2018   Procedure: LAPAROSCOPIC CHOLECYSTECTOMY;  Surgeon: Bethany Gallagher LABOR, MD;  Location: [facility] OR;  Service: General;  Laterality: N/A;   Family History  Problem Relation Age of Onset   Healthy Mother    Healthy Father    Cancer Maternal Grandfather    Asthma Brother    Alcohol abuse Neg Hx    Diabetes Neg Hx    Heart disease Neg Hx    Hypertension Neg Hx    Stroke Neg Hx    Social History[1] Allergies[2] Medications Prior to Admission  Medication Sig Dispense Refill Last Dose/Taking   Prenatal Vit-Fe Phos-FA-Omega (VITAFOL  GUMMIES)  3.33-0.333-34.8 MG CHEW Chew 3 tablets by mouth daily. 90 tablet 5 Past Month   aspirin  81 MG chewable tablet Chew 1 tablet (81 mg total) by mouth daily. 90 tablet 3    Blood Pressure Monitoring (BLOOD PRESSURE KIT) DEVI 1 Device by Does not apply route once a week. (Patient not taking: Reported on 03/21/2024) 1 each 0    Ferric Maltol  (ACCRUFER ) 30 MG CAPS Take 1 capsule (30 mg total) by mouth 2 (two) times daily before a meal. Take 2 hrs before, or 2 hrs after a meal. 60 capsule 3 More than a month    I have reviewed patient's Past Medical Hx, Surgical Hx, Family Hx, Social Hx, medications and allergies.   ROS  Pertinent items noted in HPI and remainder of comprehensive ROS otherwise negative.   PHYSICAL EXAM  Patient Vitals for the past 24 hrs:  BP Temp Temp src Pulse Resp SpO2 Height Weight  03/22/24 0921 115/72 -- -- 96 16 99 % -- --  03/22/24 0844 112/73 -- -- 100 18 99 % -- --  03/22/24 0832 114/62 98.5 F (36.9 C) Oral (!) 114 20 100 % -- --  03/22/24 0828 -- -- -- -- -- --  5' 5 (1.651 m) 72.8 kg    Constitutional: Well-developed, well-nourished female in no acute distress.  Cardiovascular: normal rate & rhythm, warm and well-perfused Respiratory: normal effort, no problems with respiration noted GI: Abd soft, non-tender, gravid MS: Extremities nontender, no edema, normal ROM Neurologic: Alert and oriented x 4.  Pelvic: Exam chaperoned by Bethany Ade RN Speculum: No pooling, no evidence of blood in the vault, scant vaginal discharge, cervix is visually closed ( Swabs sent with ROM Plus)     Fetal Tracing: NST Reactive Baseline:140 Variability: moderate Accelerations: present Decelerations: absent Toco: CTX are  irregular    Labs: Results for orders placed or performed during the hospital encounter of 03/22/24 (from the past 24 hours)  POCT fern test     Status: None   Collection Time: 03/22/24  9:04 AM  Result Value Ref Range   POCT Fern Test Negative = intact  amniotic membranes   Wet prep, genital     Status: Abnormal   Collection Time: 03/22/24  9:18 AM  Result Value Ref Range   Yeast Wet Prep HPF POC NONE SEEN NONE SEEN   Trich, Wet Prep NONE SEEN NONE SEEN   Clue Cells Wet Prep HPF POC NONE SEEN NONE SEEN   WBC, Wet Prep HPF POC >=10 (A) <10   Sperm NONE SEEN   Rupture of Membrane (ROM) Plus     Status: None   Collection Time: 03/22/24  9:18 AM  Result Value Ref Range   Rom Plus NEGATIVE     Imaging:  No results found.  MDM & MAU COURSE  MDM:  HIGH  Prenatal chart reviewed Physical exam performed with pelvic Vaginal cultures : Wet prep negative, GC pending at discharge Bethany Gallagher is negative ROM plus collected: Negative NST for gestational age and fetal reassurance: Reactive NST   MAU Course: Orders Placed This Encounter  Procedures   Wet prep, genital   Rupture of Membrane (ROM) Plus   POCT fern test   Discharge patient Discharge disposition: 01-Home or Self Care; Discharge patient date: 03/22/2024    I have reviewed the patient chart and performed the physical exam . I have ordered & interpreted the lab results and reviewed and interpreted the NST Medications ordered as stated below.  A/P as described below.  Counseling and education provided and patient agreeable  with plan as described below. Verbalized understanding.    ASSESSMENT   1. Intact amniotic membranes during pregnancy in third trimester   2. False labor   3. [redacted] weeks gestation of pregnancy   4. NST (non-stress test) reactive on fetal surveillance      PLAN  Discharge home in stable condition with return precautions.   See AVS for full description of information given to the patient including both verbal and written. Patient verbalized understanding and agrees with the plan as described above.     Follow-up Information     [facility] for [facility] at [facility] Follow up.   Specialty: Obstetrics and Gynecology Why: If symptoms worsen or  fail to resolve, As scheduled for ongoing prenatal care Contact information: [address], [address] [place]  [address] [phone]                Allergies as of 03/22/2024   No Known Allergies      Medication List     TAKE these medications    ACCRUFeR  30 MG Caps Generic drug: Ferric Maltol  Take 1 capsule (30 mg total)  by mouth 2 (two) times daily before a meal. Take 2 hrs before, or 2 hrs after a meal.   aspirin  81 MG chewable tablet Chew 1 tablet (81 mg total) by mouth daily.   Blood Pressure Kit Devi 1 Device by Does not apply route once a week.   Vitafol  Gummies 3.33-0.333-34.8 MG Chew Chew 3 tablets by mouth daily.        Olam Dalton, MSN, [facility], Center for [facility]    This chart was dictated using voice recognition software, Dragon. Despite the best efforts of this provider to proofread and correct errors, errors may still occur which can change documentation meaning.       [1]  Social History Tobacco Use   Smoking status: Former    Types: Cigarettes   Smokeless tobacco: Former    Quit date: 11/05/2015  Vaping Use   Vaping status: Never Used  Substance Use Topics   Alcohol use: Not Currently   Drug use: Not Currently    Types: Marijuana    Comment: august  [2] No Known Allergies

## 2024-03-22 NOTE — MAU Note (Signed)
 Bethany Gallagher is a 33 y.o. at [redacted]w[redacted]d here in MAU reporting: she began leaking clear fluid from her vagina at 0830 yesterday morning.  Reports leaking has been intermittently since yesterday.  Denies VB.  Reports +FM.  LMP: 07/16/2023 Onset of complaint: yesterday Pain score: 7 Vitals:   03/22/24 0832  BP: 114/62  Pulse: (!) 114  Resp: 20  Temp: 98.5 F (36.9 C)  SpO2: 100%     FHT: 136 bpm  Lab orders placed from triage: None

## 2024-03-22 NOTE — Discharge Instructions (Signed)
 SABRA

## 2024-03-23 LAB — GC/CHLAMYDIA PROBE AMP (~~LOC~~) NOT AT ARMC
Chlamydia: NEGATIVE
Comment: NEGATIVE
Comment: NORMAL
Neisseria Gonorrhea: NEGATIVE

## 2024-03-31 ENCOUNTER — Ambulatory Visit: Payer: Self-pay | Admitting: Obstetrics and Gynecology

## 2024-03-31 ENCOUNTER — Encounter: Payer: Self-pay | Admitting: Obstetrics and Gynecology

## 2024-03-31 VITALS — BP 103/70 | HR 107 | Wt 163.6 lb

## 2024-03-31 DIAGNOSIS — Z3009 Encounter for other general counseling and advice on contraception: Secondary | ICD-10-CM | POA: Diagnosis not present

## 2024-03-31 DIAGNOSIS — Z3A37 37 weeks gestation of pregnancy: Secondary | ICD-10-CM

## 2024-03-31 DIAGNOSIS — O09299 Supervision of pregnancy with other poor reproductive or obstetric history, unspecified trimester: Secondary | ICD-10-CM

## 2024-03-31 DIAGNOSIS — O0943 Supervision of pregnancy with grand multiparity, third trimester: Secondary | ICD-10-CM

## 2024-03-31 DIAGNOSIS — O09293 Supervision of pregnancy with other poor reproductive or obstetric history, third trimester: Secondary | ICD-10-CM | POA: Diagnosis not present

## 2024-03-31 DIAGNOSIS — Z3480 Encounter for supervision of other normal pregnancy, unspecified trimester: Secondary | ICD-10-CM

## 2024-03-31 NOTE — Progress Notes (Signed)
 Pt presents for ROB visit. C/o pressure and contractions.

## 2024-03-31 NOTE — Progress Notes (Signed)
" ° °  PRENATAL VISIT NOTE  Subjective:  Bethany Gallagher is a 33 y.o. G6P5005 at [redacted]w[redacted]d being seen today for ongoing prenatal care.  She is currently monitored for the following issues for this low-risk pregnancy and has Mild tetrahydrocannabinol (THC) abuse; Hx of chlamydia infection; GERD (gastroesophageal reflux disease); Supervision of other normal pregnancy, antepartum; Hx of preeclampsia, prior pregnancy, currently pregnant; and Grand multiparity with current pregnancy on their problem list.  Patient doing well with no acute concerns today. She reports occasional contractions.  Contractions: Irregular.  .  Movement: Present. Denies leaking of fluid.   The following portions of the patient's history were reviewed and updated as appropriate: allergies, current medications, past family history, past medical history, past social history, past surgical history and problem list. Problem list updated.  Objective:   Vitals:   03/31/24 1142  BP: 103/70  Pulse: (!) 107  Weight: 163 lb 9.6 oz (74.2 kg)    Fetal Status: Fetal Heart Rate (bpm): 154 Fundal Height: 37 cm Movement: Present     General:  Alert, oriented and cooperative. Patient is in no acute distress.  Skin: Skin is warm and dry. No rash noted.   Cardiovascular: Normal heart rate noted  Respiratory: Normal respiratory effort, no problems with respiration noted  Abdomen: Soft, gravid, appropriate for gestational age.  Pain/Pressure: Present     Pelvic: Cervical exam performed Dilation: 1 Effacement (%): 50 Station: Ballotable  Extremities: Normal range of motion.  Edema: None  Mental Status:  Normal mood and affect. Normal behavior. Normal judgment and thought content.   Assessment and Plan:  Pregnancy: G6P5005 at [redacted]w[redacted]d  1. [redacted] weeks gestation of pregnancy (Primary)   2. Supervision of other normal pregnancy, antepartum Continue routine prenatal care  - Strep Gp B NAA  3. Hx of preeclampsia, prior pregnancy, currently  pregnant No s/sx of preeclampsia  4. Grand multiparity with current pregnancy in third trimester Tubal form signed today  Term labor symptoms and general obstetric precautions including but not limited to vaginal bleeding, contractions, leaking of fluid and fetal movement were reviewed in detail with the patient.  Please refer to After Visit Summary for other counseling recommendations.   Return in about 1 week (around 04/07/2024) for ROB, in person.   Jerilynn Buddle, MD Faculty Attending Center for [facility] Healthcare   "

## 2024-04-04 ENCOUNTER — Ambulatory Visit

## 2024-04-04 ENCOUNTER — Ambulatory Visit (HOSPITAL_BASED_OUTPATIENT_CLINIC_OR_DEPARTMENT_OTHER)

## 2024-04-04 ENCOUNTER — Ambulatory Visit: Admitting: Obstetrics

## 2024-04-04 ENCOUNTER — Other Ambulatory Visit

## 2024-04-04 VITALS — BP 110/70

## 2024-04-04 DIAGNOSIS — Z3480 Encounter for supervision of other normal pregnancy, unspecified trimester: Secondary | ICD-10-CM

## 2024-04-04 DIAGNOSIS — Z363 Encounter for antenatal screening for malformations: Secondary | ICD-10-CM | POA: Diagnosis not present

## 2024-04-04 DIAGNOSIS — Z3A37 37 weeks gestation of pregnancy: Secondary | ICD-10-CM | POA: Diagnosis not present

## 2024-04-04 DIAGNOSIS — O09293 Supervision of pregnancy with other poor reproductive or obstetric history, third trimester: Secondary | ICD-10-CM | POA: Insufficient documentation

## 2024-04-04 DIAGNOSIS — O0933 Supervision of pregnancy with insufficient antenatal care, third trimester: Secondary | ICD-10-CM | POA: Diagnosis not present

## 2024-04-04 NOTE — Progress Notes (Signed)
 MFM Consult Note  Bethany Gallagher is currently at [redacted]w[redacted]d. She was seen for an ultrasound exam today as she has had limited prenatal care in her current pregnancy and due to grand multiparity.    She denies any problems in her current pregnancy.  Her blood pressure today was 110/70.  Based on her LMP, her EDC is April 21, 2024, making her 37 weeks and 4 days pregnant today.  She had a cell free DNA test drawn earlier in her pregnancy which indicated a low risk for trisomy 21, 54, and 13.  A female fetus is predicted.  Sonographic findings Single intrauterine pregnancy at 37w 4d. Fetal cardiac activity:  Observed and appears normal. Presentation: Cephalic. The views of the fetal anatomy were limited today due to her advanced gestational age. Fetal biometry shows the estimated fetal weight of 6 lb 2 oz, 2781 grams (18%). Amniotic fluid: Within normal limits.  AFI: 12.28 cm.  MVP: 3.97 cm. Placenta: Posterior.  Fetal breathing movements and fetal body movements were noted throughout today's exam.    Doppler studies of the umbilical arteries performed today showed a normal S/D ratio.  There were no signs of absent or reversed end-diastolic flow.  The patient was advised that the overall EFW obtained today is consistent with the EDD based on her LMP, making her 37 weeks and 4 days pregnant today.  The patient has a history of having smaller sized babies.  Fetal kick count instructions were reviewed.    She should be delivered by her due date.  The patient stated that all of her questions were answered.   A total of 30 minutes was spent counseling and coordinating the care for this patient.  Greater than 50% of the time was spent in direct face-to-face contact.

## 2024-04-05 LAB — STREP GP B NAA: Strep Gp B NAA: NEGATIVE

## 2024-04-06 NOTE — L&D Delivery Note (Signed)
 OB/GYN Faculty Practice Delivery Note  Bethany Gallagher is a 34 y.o. [id number] s/p SVD at [redacted]w[redacted]d. She was admitted for elective IOL.   ROM: 1h 74m with clear fluid GBS Status: negative Maximum Maternal Temperature: 98.9  Labor Progress: Initital SVE 1cm. Progressed to complete with cytotec  and AROM.  Delivery Date/Time: 04/23/24 @ 1447 Delivery: Called to room and patient was complete. Pushed for only a few contractions. Head delivered LOA. No nuchal cord present. Shoulder and body delivered after releasing posterior shoulder. Infant with spontaneous cry, placed on mother's abdomen, dried and stimulated. Cord clamped x 2 after 1-minute delay, and cut by FOB. Cord blood drawn. Placenta delivered spontaneously, intact, with 3-vessel cord. Fundus firm with massage and Pitocin . Labia, perineum, vagina, and cervix inspected, no laceration identified and excellent hemostasis and approximation noted.   Placenta: extra lobe. To L&D Complications: none Lacerations: no laceration identified and excellent hemostasis and approximation noted.  EBL: 231 Analgesia: epidural  Postpartum Planning [x ] transfer orders to MB [ ]  discharge summary started & shared [ ]  message to sent to schedule follow-up  [x ] lists updated  Infant: Baby girl  APGARs 9/9  3100g  Conard Me, SNM, RNC-OB Student Nurse Midwife 04/23/2024 3:16 PM

## 2024-04-07 ENCOUNTER — Ambulatory Visit: Payer: Self-pay | Admitting: Obstetrics and Gynecology

## 2024-04-10 ENCOUNTER — Ambulatory Visit: Payer: Self-pay | Admitting: Obstetrics and Gynecology

## 2024-04-10 VITALS — BP 112/71 | HR 108 | Wt 167.2 lb

## 2024-04-10 DIAGNOSIS — O0943 Supervision of pregnancy with grand multiparity, third trimester: Secondary | ICD-10-CM

## 2024-04-10 DIAGNOSIS — Z3009 Encounter for other general counseling and advice on contraception: Secondary | ICD-10-CM

## 2024-04-10 DIAGNOSIS — O09299 Supervision of pregnancy with other poor reproductive or obstetric history, unspecified trimester: Secondary | ICD-10-CM

## 2024-04-10 DIAGNOSIS — Z3A38 38 weeks gestation of pregnancy: Secondary | ICD-10-CM

## 2024-04-10 DIAGNOSIS — Z3480 Encounter for supervision of other normal pregnancy, unspecified trimester: Secondary | ICD-10-CM

## 2024-04-10 DIAGNOSIS — O09293 Supervision of pregnancy with other poor reproductive or obstetric history, third trimester: Secondary | ICD-10-CM

## 2024-04-10 NOTE — Progress Notes (Signed)
 Pt presents for rob. Pt has no questions or concerns at this time.

## 2024-04-10 NOTE — Progress Notes (Signed)
" ° °  PRENATAL VISIT NOTE  Subjective:  Bethany Gallagher is a 34 y.o. G6P5005 at [redacted]w[redacted]d being seen today for ongoing prenatal care.  She is currently monitored for the following issues for this low-risk pregnancy and has Mild tetrahydrocannabinol (THC) abuse; Hx of chlamydia infection; GERD (gastroesophageal reflux disease); Supervision of other normal pregnancy, antepartum; Hx of preeclampsia, prior pregnancy, currently pregnant; Grand multiparity with current pregnancy; and Unwanted fertility on their problem list.  Patient doing well with no acute concerns today. She reports occasional contractions.  Contractions: Irritability. Vag. Bleeding: None.  Movement: Present. Denies leaking of fluid.   The following portions of the patient's history were reviewed and updated as appropriate: allergies, current medications, past family history, past medical history, past social history, past surgical history and problem list. Problem list updated.  Objective:   Vitals:   04/10/24 0952  BP: 112/71  Pulse: (!) 108  Weight: 167 lb 3.2 oz (75.8 kg)    Fetal Status: Fetal Heart Rate (bpm): 150 Fundal Height: 38 cm Movement: Present     General:  Alert, oriented and cooperative. Patient is in no acute distress.  Skin: Skin is warm and dry. No rash noted.   Cardiovascular: Normal heart rate noted  Respiratory: Normal respiratory effort, no problems with respiration noted  Abdomen: Soft, gravid, appropriate for gestational age.  Pain/Pressure: Present     Pelvic: Cervical exam deferred        Extremities: Normal range of motion.  Edema: Trace  Mental Status:  Normal mood and affect. Normal behavior. Normal judgment and thought content.   Assessment and Plan:  Pregnancy: G6P5005 at [redacted]w[redacted]d  1. [redacted] weeks gestation of pregnancy (Primary)   2. Unwanted fertility BTL form signed 03/31/24  3. Supervision of other normal pregnancy, antepartum Continue routine prenatal care Per MFM, delivery advised by  due date, will schedule IOL  4. Hx of preeclampsia, prior pregnancy, currently pregnant No s/sx of preeclampsia  5. Grand multiparity with current pregnancy in third trimester   Term labor symptoms and general obstetric precautions including but not limited to vaginal bleeding, contractions, leaking of fluid and fetal movement were reviewed in detail with the patient.  Please refer to After Visit Summary for other counseling recommendations.   Return in about 1 week (around 04/17/2024) for ROB, in person.   Jerilynn Buddle, MD Faculty Attending Center for [facility] Healthcare   "

## 2024-04-17 ENCOUNTER — Telehealth (HOSPITAL_COMMUNITY): Payer: Self-pay | Admitting: *Deleted

## 2024-04-17 NOTE — Telephone Encounter (Signed)
 Preadmission screen

## 2024-04-18 ENCOUNTER — Encounter: Payer: Self-pay | Admitting: Obstetrics & Gynecology

## 2024-04-18 ENCOUNTER — Encounter (HOSPITAL_COMMUNITY): Payer: Self-pay | Admitting: *Deleted

## 2024-04-18 ENCOUNTER — Telehealth (HOSPITAL_COMMUNITY): Payer: Self-pay | Admitting: *Deleted

## 2024-04-18 NOTE — Telephone Encounter (Signed)
 Preadmission screen

## 2024-04-19 ENCOUNTER — Inpatient Hospital Stay (HOSPITAL_COMMUNITY)
Admission: AD | Admit: 2024-04-19 | Discharge: 2024-04-19 | Disposition: A | Discharge status: Home / Self Care | Attending: Obstetrics and Gynecology | Admitting: Obstetrics and Gynecology

## 2024-04-19 ENCOUNTER — Encounter (HOSPITAL_COMMUNITY): Payer: Self-pay | Admitting: Obstetrics and Gynecology

## 2024-04-19 DIAGNOSIS — Z3A39 39 weeks gestation of pregnancy: Secondary | ICD-10-CM | POA: Diagnosis not present

## 2024-04-19 DIAGNOSIS — O479 False labor, unspecified: Secondary | ICD-10-CM | POA: Diagnosis not present

## 2024-04-19 NOTE — MAU Note (Signed)
 Bethany Gallagher is a 34 y.o. at [redacted]w[redacted]d here in MAU reporting: ctx for the past 2 hours about 15-20 min. Denies any vag bleeding or leaking. Good fetal movement  LMP:  Onset of complaint: 2 hours Pain score: 8 Vitals:   04/19/24 1821  BP: 98/64  Pulse: (!) 111  Resp: 18  Temp: 98.8 F (37.1 C)     FHT: 160  Lab orders placed from triage: labor eval

## 2024-04-19 NOTE — MAU Provider Note (Signed)
 Bethany Gallagher is a 34 y.o. G29P5005 female at [redacted]w[redacted]d  RN Labor check, not seen by provider SVE by RN: Dilation: 1.5 Effacement (%): 50 Station: -3 Exam by:: Oleh Loges RN  NST: FHR baseline 140s bpm, Variability: moderate, Accelerations:present, Decelerations:  Absent= Cat 1/Reactive; mi v-shaped variable right when EFM started, then reactive x 30 mins Toco: irreg  D/C home  Suzen JONETTA Gentry CNM 04/19/2024 7:49 PM

## 2024-04-21 ENCOUNTER — Inpatient Hospital Stay (HOSPITAL_COMMUNITY): Payer: Self-pay

## 2024-04-22 ENCOUNTER — Encounter (HOSPITAL_COMMUNITY): Payer: Self-pay | Admitting: Obstetrics and Gynecology

## 2024-04-22 ENCOUNTER — Inpatient Hospital Stay (HOSPITAL_COMMUNITY)
Admission: AD | Admit: 2024-04-22 | Discharge: 2024-04-22 | Disposition: A | Discharge status: Home / Self Care | Attending: Obstetrics and Gynecology | Admitting: Obstetrics and Gynecology

## 2024-04-22 DIAGNOSIS — Z3A40 40 weeks gestation of pregnancy: Secondary | ICD-10-CM

## 2024-04-22 DIAGNOSIS — Z3689 Encounter for other specified antenatal screening: Secondary | ICD-10-CM

## 2024-04-22 DIAGNOSIS — Z3480 Encounter for supervision of other normal pregnancy, unspecified trimester: Secondary | ICD-10-CM

## 2024-04-22 DIAGNOSIS — O4703 False labor before 37 completed weeks of gestation, third trimester: Secondary | ICD-10-CM | POA: Diagnosis not present

## 2024-04-22 NOTE — MAU Provider Note (Signed)
"   None      S: Ms. Bethany Gallagher is a 34 y.o. G6P5005 at [redacted]w[redacted]d  who presents to MAU today complaining contractions. She denies vaginal bleeding. She denies LOF. She reports normal fetal movement.    O: BP 107/68 (BP Location: Right Arm)   Pulse 88   Temp 98 F (36.7 C) (Oral)   Resp 12   Ht 5' 5 (1.651 m)   Wt 75.8 kg   LMP 07/16/2023 (Exact Date)   BMI 27.79 kg/m  GENERAL: Well-developed, well-nourished female in no acute distress.  HEAD: Normocephalic, atraumatic.  CHEST: Normal effort of breathing, regular heart rate ABDOMEN: Soft, nontender, gravid  Cervical exam:  Dilation: 1 Effacement (%): 50 Station: -3 Presentation: Undeterminable Exam by:: DCallaway, RN   Fetal Monitoring: Baseline: 135 Variability: Moderate Accelerations: Present Decelerations: None Contractions: Irregular   A: SIUP at [redacted]w[redacted]d  False labor  P: -Patient unchanged after nurse evaluation. -Provider to bedside to discuss discharge and reassure patient that she remains on induction list.  Cautioned that at 40 weeks she is considered elective so medical patients will be initially accommodated. -Labor precautions reviewed.  -Patient verbalizes understanding. -Discharged to home in stable condition.  Synthia Raisin, CNM 04/22/2024 10:06 AM  "

## 2024-04-22 NOTE — MAU Note (Signed)
 Pt says UC strong since 0500. PNC- [facility] No VE Denies HSV GBS- neg Was sch for an Induction yesterday . Feels baby moving

## 2024-04-23 ENCOUNTER — Encounter (HOSPITAL_COMMUNITY): Payer: Self-pay | Admitting: Obstetrics and Gynecology

## 2024-04-23 ENCOUNTER — Inpatient Hospital Stay (HOSPITAL_COMMUNITY)
Admission: RE | Admit: 2024-04-23 | Discharge: 2024-04-24 | DRG: 807 | Disposition: A | Payer: Self-pay | Attending: Obstetrics and Gynecology | Admitting: Obstetrics and Gynecology

## 2024-04-23 ENCOUNTER — Other Ambulatory Visit: Payer: Self-pay

## 2024-04-23 ENCOUNTER — Inpatient Hospital Stay (HOSPITAL_COMMUNITY): Payer: Self-pay | Admitting: Anesthesiology

## 2024-04-23 ENCOUNTER — Encounter (HOSPITAL_COMMUNITY): Payer: Self-pay

## 2024-04-23 DIAGNOSIS — Z87891 Personal history of nicotine dependence: Secondary | ICD-10-CM

## 2024-04-23 DIAGNOSIS — Z3009 Encounter for other general counseling and advice on contraception: Secondary | ICD-10-CM | POA: Diagnosis present

## 2024-04-23 DIAGNOSIS — Z3480 Encounter for supervision of other normal pregnancy, unspecified trimester: Secondary | ICD-10-CM

## 2024-04-23 DIAGNOSIS — O0940 Supervision of pregnancy with grand multiparity, unspecified trimester: Secondary | ICD-10-CM

## 2024-04-23 DIAGNOSIS — O09299 Supervision of pregnancy with other poor reproductive or obstetric history, unspecified trimester: Secondary | ICD-10-CM

## 2024-04-23 DIAGNOSIS — O480 Post-term pregnancy: Principal | ICD-10-CM | POA: Diagnosis present

## 2024-04-23 DIAGNOSIS — Z3A40 40 weeks gestation of pregnancy: Secondary | ICD-10-CM | POA: Diagnosis not present

## 2024-04-23 DIAGNOSIS — O26893 Other specified pregnancy related conditions, third trimester: Secondary | ICD-10-CM | POA: Diagnosis present

## 2024-04-23 LAB — HEMOGLOBIN A1C
Hgb A1c MFr Bld: 5.5 % (ref 4.8–5.6)
Mean Plasma Glucose: 111.15 mg/dL

## 2024-04-23 LAB — COMPREHENSIVE METABOLIC PANEL WITH GFR
ALT: 26 U/L (ref 0–44)
AST: 26 U/L (ref 15–41)
Albumin: 3.5 g/dL (ref 3.5–5.0)
Alkaline Phosphatase: 239 U/L — ABNORMAL HIGH (ref 38–126)
Anion gap: 11 (ref 5–15)
BUN: 5 mg/dL — ABNORMAL LOW (ref 6–20)
CO2: 21 mmol/L — ABNORMAL LOW (ref 22–32)
Calcium: 9.1 mg/dL (ref 8.9–10.3)
Chloride: 102 mmol/L (ref 98–111)
Creatinine, Ser: 0.65 mg/dL (ref 0.44–1.00)
GFR, Estimated: >60 mL/min
Glucose, Bld: 88 mg/dL (ref 70–99)
Potassium: 3.9 mmol/L (ref 3.5–5.1)
Sodium: 135 mmol/L (ref 135–145)
Total Bilirubin: 0.4 mg/dL (ref 0.0–1.2)
Total Protein: 6.7 g/dL (ref 6.5–8.1)

## 2024-04-23 LAB — CBC
HCT: 33.4 % — ABNORMAL LOW (ref 36.0–46.0)
Hemoglobin: 10.9 g/dL — ABNORMAL LOW (ref 12.0–15.0)
MCH: 28.9 pg (ref 26.0–34.0)
MCHC: 32.6 g/dL (ref 30.0–36.0)
MCV: 88.6 fL (ref 80.0–100.0)
Platelets: 271 K/uL (ref 150–400)
RBC: 3.77 MIL/uL — ABNORMAL LOW (ref 3.87–5.11)
RDW: 14.1 % (ref 11.5–15.5)
WBC: 5.8 K/uL (ref 4.0–10.5)
nRBC: 0.3 % — ABNORMAL HIGH (ref 0.0–0.2)

## 2024-04-23 LAB — TYPE AND SCREEN
ABO/RH(D): O POS
Antibody Screen: NEGATIVE

## 2024-04-23 LAB — SYPHILIS: RPR W/REFLEX TO RPR TITER AND TREPONEMAL ANTIBODIES, TRADITIONAL SCREENING AND DIAGNOSIS ALGORITHM: RPR Ser Ql: NONREACTIVE

## 2024-04-23 MED ORDER — BENZOCAINE-MENTHOL 20-0.5 % EX AERO: 1.0000 | INHALATION_SPRAY | CUTANEOUS | Status: DC | PRN

## 2024-04-23 MED ORDER — ACETAMINOPHEN 325 MG PO TABS: 650.0000 mg | ORAL_TABLET | ORAL | Status: DC | PRN

## 2024-04-23 MED ORDER — TRANEXAMIC ACID-NACL 1000-0.7 MG/100ML-% IV SOLN
INTRAVENOUS | Status: AC
  Administered 2024-04-23: 1000 mg via INTRAVENOUS
  Filled 2024-04-23: qty 100

## 2024-04-23 MED ORDER — LACTATED RINGERS IV SOLN: 500.0000 mL | Freq: Once | INTRAVENOUS | Status: DC

## 2024-04-23 MED ORDER — MISOPROSTOL 50MCG HALF TABLET
50.0000 ug | ORAL_TABLET | Freq: Once | ORAL | Status: AC
  Administered 2024-04-23: 50 ug via ORAL
  Filled 2024-04-23: qty 1

## 2024-04-23 MED ORDER — FENTANYL CITRATE (PF) 100 MCG/2ML IJ SOLN: 50.0000 ug | INTRAMUSCULAR | Status: DC | PRN

## 2024-04-23 MED ORDER — COCONUT OIL OIL
1.0000 | TOPICAL_OIL | Status: DC | PRN
  Administered 2024-04-24: 1 via TOPICAL

## 2024-04-23 MED ORDER — ONDANSETRON HCL 4 MG PO TABS: 4.0000 mg | ORAL_TABLET | ORAL | Status: DC | PRN

## 2024-04-23 MED ORDER — LIDOCAINE HCL (PF) 1 % IJ SOLN: 30.0000 mL | INTRAMUSCULAR | Status: DC | PRN

## 2024-04-23 MED ORDER — TETANUS-DIPHTH-ACELL PERTUSSIS 5-2-15.5 LF-MCG/0.5 IM SUSP: 0.5000 mL | Freq: Once | INTRAMUSCULAR | Status: DC

## 2024-04-23 MED ORDER — OXYTOCIN-SODIUM CHLORIDE 30-0.9 UT/500ML-% IV SOLN
2.5000 [IU]/h | INTRAVENOUS | Status: DC
  Filled 2024-04-23: qty 500

## 2024-04-23 MED ORDER — DIPHENHYDRAMINE HCL 50 MG/ML IJ SOLN: 12.5000 mg | INTRAMUSCULAR | Status: DC | PRN

## 2024-04-23 MED ORDER — TERBUTALINE SULFATE 1 MG/ML IJ SOLN: 0.2500 mg | Freq: Once | INTRAMUSCULAR | Status: DC | PRN

## 2024-04-23 MED ORDER — LIDOCAINE HCL (PF) 1 % IJ SOLN
INTRAMUSCULAR | Status: DC | PRN
  Administered 2024-04-23: 5 mL via EPIDURAL

## 2024-04-23 MED ORDER — ONDANSETRON HCL 4 MG/2ML IJ SOLN: 4.0000 mg | Freq: Four times a day (QID) | INTRAMUSCULAR | Status: DC | PRN

## 2024-04-23 MED ORDER — PRENATAL MULTIVITAMIN CH
1.0000 | ORAL_TABLET | Freq: Every day | ORAL | Status: DC
  Administered 2024-04-24: 1 via ORAL
  Filled 2024-04-23: qty 1

## 2024-04-23 MED ORDER — OXYCODONE HCL 5 MG PO TABS: 5.0000 mg | ORAL_TABLET | ORAL | Status: DC | PRN

## 2024-04-23 MED ORDER — EPHEDRINE 5 MG/ML INJ: 10.0000 mg | INTRAVENOUS | Status: DC | PRN

## 2024-04-23 MED ORDER — WITCH HAZEL-GLYCERIN EX PADS: 1.0000 | MEDICATED_PAD | CUTANEOUS | Status: DC | PRN

## 2024-04-23 MED ORDER — OXYCODONE-ACETAMINOPHEN 5-325 MG PO TABS: 1.0000 | ORAL_TABLET | ORAL | Status: DC | PRN

## 2024-04-23 MED ORDER — ONDANSETRON HCL 4 MG/2ML IJ SOLN: 4.0000 mg | INTRAMUSCULAR | Status: DC | PRN

## 2024-04-23 MED ORDER — PHENYLEPHRINE 80 MCG/ML (10ML) SYRINGE FOR IV PUSH (FOR BLOOD PRESSURE SUPPORT): 80.0000 ug | PREFILLED_SYRINGE | INTRAVENOUS | Status: DC | PRN

## 2024-04-23 MED ORDER — DIPHENHYDRAMINE HCL 25 MG PO CAPS: 25.0000 mg | ORAL_CAPSULE | Freq: Four times a day (QID) | ORAL | Status: DC | PRN

## 2024-04-23 MED ORDER — IBUPROFEN 600 MG PO TABS
600.0000 mg | ORAL_TABLET | Freq: Four times a day (QID) | ORAL | Status: DC
  Administered 2024-04-23 – 2024-04-24 (×4): 600 mg via ORAL
  Filled 2024-04-23 (×4): qty 1

## 2024-04-23 MED ORDER — OXYTOCIN-SODIUM CHLORIDE 30-0.9 UT/500ML-% IV SOLN: 1.0000 m[IU]/min | INTRAVENOUS | Status: DC

## 2024-04-23 MED ORDER — SIMETHICONE 80 MG PO CHEW: 80.0000 mg | CHEWABLE_TABLET | ORAL | Status: DC | PRN

## 2024-04-23 MED ORDER — ZOLPIDEM TARTRATE 5 MG PO TABS: 5.0000 mg | ORAL_TABLET | Freq: Every evening | ORAL | Status: DC | PRN

## 2024-04-23 MED ORDER — TRANEXAMIC ACID-NACL 1000-0.7 MG/100ML-% IV SOLN: 1000.0000 mg | INTRAVENOUS | Status: AC

## 2024-04-23 MED ORDER — OXYCODONE HCL 5 MG PO TABS: 10.0000 mg | ORAL_TABLET | ORAL | Status: DC | PRN

## 2024-04-23 MED ORDER — LACTATED RINGERS IV SOLN: 500.0000 mL | INTRAVENOUS | Status: DC | PRN

## 2024-04-23 MED ORDER — LACTATED RINGERS IV SOLN: INTRAVENOUS | Status: DC

## 2024-04-23 MED ORDER — SOD CITRATE-CITRIC ACID 500-334 MG/5ML PO SOLN: 30.0000 mL | ORAL | Status: DC | PRN

## 2024-04-23 MED ORDER — DIBUCAINE (PERIANAL) 1 % EX OINT: 1.0000 | TOPICAL_OINTMENT | CUTANEOUS | Status: DC | PRN

## 2024-04-23 MED ORDER — OXYTOCIN BOLUS FROM INFUSION
333.0000 mL | Freq: Once | INTRAVENOUS | Status: AC
  Administered 2024-04-23: 333 mL via INTRAVENOUS

## 2024-04-23 MED ORDER — SENNOSIDES-DOCUSATE SODIUM 8.6-50 MG PO TABS
2.0000 | ORAL_TABLET | Freq: Every day | ORAL | Status: DC
  Administered 2024-04-24: 2 via ORAL
  Filled 2024-04-23: qty 2

## 2024-04-23 MED ORDER — FENTANYL-BUPIVACAINE-NACL 0.5-0.125-0.9 MG/250ML-% EP SOLN
12.0000 mL/h | EPIDURAL | Status: DC | PRN
  Administered 2024-04-23: 12 mL/h via EPIDURAL
  Filled 2024-04-23: qty 250

## 2024-04-23 MED ORDER — MISOPROSTOL 25 MCG QUARTER TABLET
25.0000 ug | ORAL_TABLET | Freq: Once | ORAL | Status: AC
  Administered 2024-04-23: 25 ug via VAGINAL
  Filled 2024-04-23: qty 1

## 2024-04-23 NOTE — Plan of Care (Signed)

## 2024-04-23 NOTE — Lactation Note (Signed)
 This note was copied from a baby's chart. Lactation Consultation Note  Patient Name: Bethany Gallagher Date: 04/23/2024 Age:32 hours Reason for consult: Initial assessment;Term  P6- MOB's feeding plan is to offer both breast milk and formula. MOB plans to latch infant during the day, and formula feed during the night. MOB reports that this infant has been latching really well so far. MOB denies having any pain or discomfort. Infant had recently fed, so LC encouraged MOB to call for a latch assessment. MOB requested apple juice and formula, so LC provided her with both. LC encouraged MOB to stimulate her breast during the times that infant has formula only. LC sent [facility] referral.  LC reviewed the first 24 hr birthday nap, day 2 cluster feeding, feeding infant on cue 8-12x in 24 hrs, not allowing infant to go over 3 hrs without a feeding, CDC milk storage guidelines, LC services handout and engorgement/breast care. LC encouraged MOB to call for further assistance as needed.  Maternal Data Has patient been taught Hand Expression?: No Does the patient have breastfeeding experience prior to this delivery?: Yes How long did the patient breastfeed?: first child for 1 year and 4th child for 1 year, other 3 children were formula only by choice  Feeding Mother's Current Feeding Choice: Breast Milk and Formula  Lactation Tools Discussed/Used Pump Education: Milk Storage  Interventions Interventions: Breast feeding basics reviewed;Education;LC Services brochure  Discharge Discharge Education: Engorgement and breast care;Warning signs for feeding baby Pump: Referral sent for [facility] Pump  Consult Status Consult Status: Follow-up Date: 04/24/24 Follow-up type: In-patient    Recardo Hoit BS, IBCLC 04/23/2024, 7:48 PM

## 2024-04-23 NOTE — Anesthesia Procedure Notes (Signed)
 Epidural Patient location during procedure: OB Start time: 04/23/2024 10:18 AM End time: 04/23/2024 10:31 AM  Staffing Anesthesiologist: Jefm Garnette LABOR, MD Performed: anesthesiologist   Preanesthetic Checklist Completed: patient identified, IV checked, site marked, risks and benefits discussed, surgical consent, monitors and equipment checked, pre-op evaluation and timeout performed  Epidural Patient position: sitting Prep: DuraPrep and site prepped and draped Patient monitoring: continuous pulse ox and blood pressure Approach: midline Location: L3-L4 Injection technique: LOR air  Needle:  Needle type: Tuohy  Needle gauge: 17 G Needle length: 9 cm and 9 Needle insertion depth: 5 cm Catheter type: closed end flexible Catheter size: 19 Gauge Catheter at skin depth: 11 cm Test dose: negative  Assessment Events: blood not aspirated, no cerebrospinal fluid, injection not painful, no injection resistance, no paresthesia and negative IV test  Additional Notes Patient identified. Risks/Benefits/Options discussed with patient including but not limited to bleeding, infection, nerve damage, paralysis, failed block, incomplete pain control, headache, blood pressure changes, nausea, vomiting, reactions to medication both or allergic, itching and postpartum back pain. Confirmed with bedside nurse the patient's most recent platelet count. Confirmed with patient that they are not currently taking any anticoagulation, have any bleeding history or any family history of bleeding disorders. Patient expressed understanding and wished to proceed. All questions were answered. Sterile technique was used throughout the entire procedure. Please see nursing notes for vital signs. Test dose was given through epidural needle and negative prior to continuing to dose epidural or start infusion. Warning signs of high block given to the patient including shortness of breath, tingling/numbness in hands, complete  motor block, or any concerning symptoms with instructions to call for help. Patient was given instructions on fall risk and not to get out of bed. All questions and concerns addressed with instructions to call with any issues.  1 Attempt (S) . Patient tolerated procedure well.

## 2024-04-23 NOTE — Anesthesia Preprocedure Evaluation (Addendum)
 "                                  Anesthesia Evaluation  Patient identified by MRN, date of birth, ID band Patient awake    Reviewed: Allergy & Precautions, NPO status , Patient's Chart, lab work & pertinent test results  Airway Mallampati: II  TM Distance: >3 FB Neck ROM: Full    Dental no notable dental hx. (+) Teeth Intact   Pulmonary former smoker   Pulmonary exam normal breath sounds clear to auscultation       Cardiovascular Normal cardiovascular exam Rhythm:Regular Rate:Normal     Neuro/Psych    GI/Hepatic negative GI ROS, Neg liver ROS,,,  Endo/Other  negative endocrine ROS    Renal/GU negative Renal ROS  negative genitourinary   Musculoskeletal   Abdominal   Peds negative pediatric ROS (+)  Hematology Lab Results      Component                Value               Date                      WBC                      5.8                 04/23/2024                HGB                      10.9 (L)            04/23/2024                HCT                      33.4 (L)            04/23/2024                MCV                      88.6                04/23/2024                PLT                      271                 04/23/2024              Anesthesia Other Findings   Reproductive/Obstetrics (+) Pregnancy                              Anesthesia Physical Anesthesia Plan  ASA: 2  Anesthesia Plan: Epidural   Post-op Pain Management:    Induction:   PONV Risk Score and Plan:   Airway Management Planned:   Additional Equipment:   Intra-op Plan:   Post-operative Plan:   Informed Consent: I have reviewed the patients History and Physical, chart, labs and discussed the procedure including the risks, benefits and alternatives for the proposed anesthesia with the patient or authorized  representative who has indicated his/her understanding and acceptance.       Plan Discussed with:   Anesthesia Plan  Comments: (40.2 wk G6P5 for LEA)         Anesthesia Quick Evaluation  "

## 2024-04-23 NOTE — H&P (Signed)
 OBSTETRIC ADMISSION HISTORY AND PHYSICAL  Bethany Gallagher is a 34 y.o. female [phone] with IUP at [redacted]w[redacted]d (dated by LMP, Estimated Date of Delivery: 04/21/24) presenting for elective IOL.   She reports +FMs, No LOF, no VB, no blurry vision, headaches or peripheral edema, and RUQ pain.    She plans on breast feeding. She request interval BTS for birth control.  She received her prenatal care at [facility]   Prenatal History/Complications:   Grand multiparity Late [facility]  Past Medical History: Past Medical History:  Diagnosis Date   GERD (gastroesophageal reflux disease)    Hx of chlamydia infection    Hx of gonorrhea    Pregnancy induced hypertension    2013    Vaginal Pap smear, abnormal     Past Surgical History: Past Surgical History:  Procedure Laterality Date   CHOLECYSTECTOMY N/A 02/18/2018   Procedure: LAPAROSCOPIC CHOLECYSTECTOMY;  Surgeon: Signe Mitzie LABOR, MD;  Location: [facility] OR;  Service: General;  Laterality: N/A;    Obstetrical History: OB History     Gravida  6   Para  5   Term  5   Preterm  0   AB  0   Living  5      SAB  0   IAB  0   Ectopic  0   Multiple  0   Live Births  5           Social History Social History   Socioeconomic History   Marital status: Significant Other    Spouse name: Not on file   Number of children: Not on file   Years of education: Not on file   Highest education level: Not on file  Occupational History   Not on file  Tobacco Use   Smoking status: Former    Types: Cigarettes   Smokeless tobacco: Former    Quit date: 11/05/2015  Vaping Use   Vaping status: Never Used  Substance and Sexual Activity   Alcohol use: Not Currently   Drug use: Not Currently    Types: Marijuana    Comment: august   Sexual activity: Yes    Partners: Male    Birth control/protection: None  Other Topics Concern   Not on file  Social History Narrative   Not on file   Social Drivers of Health   Tobacco Use: Medium Risk  (04/19/2024)   Patient History    Smoking Tobacco Use: Former    Smokeless Tobacco Use: Former    Passive Exposure: Not on Actuary Strain: Not on file  Food Insecurity: No Food Insecurity (04/23/2024)   Epic    Worried About Programme Researcher, Broadcasting/film/video in the Last Year: Never true    Ran Out of Food in the Last Year: Never true  Transportation Needs: No Transportation Needs (04/23/2024)   Epic    Lack of Transportation (Medical): No    Lack of Transportation (Non-Medical): No  Physical Activity: Not on file  Stress: Not on file  Social Connections: Not on file  Depression (EYV7-0): Low Risk (11/04/2023)   Depression (PHQ2-9)    PHQ-2 Score: 0  Alcohol Screen: Not on file  Housing: Low Risk (04/23/2024)   Epic    Unable to Pay for Housing in the Last Year: No    Number of Times Moved in the Last Year: 0    Homeless in the Last Year: No  Utilities: Not At Risk (04/23/2024)   Epic    Threatened  with loss of utilities: No  Health Literacy: Not on file    Family History: Family History  Problem Relation Age of Onset   Healthy Mother    Healthy Father    Cancer Maternal Grandfather    Asthma Brother    Alcohol abuse Neg Hx    Diabetes Neg Hx    Heart disease Neg Hx    Hypertension Neg Hx    Stroke Neg Hx     Allergies: Allergies[1]  Medications Prior to Admission  Medication Sig Dispense Refill Last Dose/Taking   Blood Pressure Monitoring (BLOOD PRESSURE KIT) DEVI 1 Device by Does not apply route once a week. (Patient not taking: Reported on 04/04/2024) 1 each 0    Prenatal Vit-Fe Fumarate-FA (PRENATAL MULTIVITAMIN) TABS tablet Take 1 tablet by mouth daily at 12 noon.        Review of Systems  All systems reviewed and negative except as stated in HPI.  Blood pressure 97/63, pulse (!) 108, temperature 98.1 F (36.7 C), temperature source Oral, height 5' 5 (1.651 m), weight 75 kg, last menstrual period 07/16/2023. General appearance: alert, cooperative, and  appears stated age Lungs: breathing comfortably on room air Heart: regular rate Abdomen: soft, non-tender; gravid Extremities: no edema of bilateral lower extremities DTR's intact Presentation: cephalic Fetal monitoringBaseline: 125 bpm, Variability: Good {> 6 bpm), Accelerations: Reactive, and Decelerations: Absent Uterine activity None  Dilation: 1 Effacement (%): 50 Station: -3 Exam by:: Dedra Fireman, RN   Prenatal labs: ABO, Rh: --/--/PENDING (01/18 9389) Antibody: PENDING (01/18 0610) Rubella: 1.04 (12/12 1018) RPR: NON REACTIVE (12/12 1018)  HBsAg: NON REACTIVE (12/12 1018)  HIV: Non Reactive (12/12 1018)  GBS: Negative/-- (12/29 1141)  2 hr Glucola not done Genetic screening  LR female Anatomy US  Appears normal  Last US : At [redacted]w[redacted]d - cephalic presentation, EFW 2781g (18 %tile), AC 56%  Prenatal Transfer Tool  Maternal Diabetes: No Genetic Screening: Normal Maternal Ultrasounds/Referrals: Normal Fetal Ultrasounds or other Referrals:  None Maternal Substance Abuse:  No Significant Maternal Medications:  None Significant Maternal Lab Results:  Group B Strep negative Number of Prenatal Visits:greater than 3 verified prenatal visits Other Comments:  None  Results for orders placed or performed during the hospital encounter of 04/23/24 (from the past 24 hours)  CBC   Collection Time: 04/23/24  5:50 AM  Result Value Ref Range   WBC 5.8 4.0 - 10.5 K/uL   RBC 3.77 (L) 3.87 - 5.11 MIL/uL   Hemoglobin 10.9 (L) 12.0 - 15.0 g/dL   HCT 66.5 (L) 63.9 - 53.9 %   MCV 88.6 80.0 - 100.0 fL   MCH 28.9 26.0 - 34.0 pg   MCHC 32.6 30.0 - 36.0 g/dL   RDW 85.8 88.4 - 84.4 %   Platelets 271 150 - 400 K/uL   nRBC 0.3 (H) 0.0 - 0.2 %  Type and screen   Collection Time: 04/23/24  6:10 AM  Result Value Ref Range   ABO/RH(D) PENDING    Antibody Screen PENDING    Sample Expiration      04/26/2024,2359 Performed at [facility], [address], [place], [place] [address]      Patient Active Problem List   Diagnosis Date Noted   Indication for care or intervention in labor or delivery 04/23/2024   Unwanted fertility 03/31/2024   Grand multiparity with current pregnancy 11/30/2023   Hx of preeclampsia, prior pregnancy, currently pregnant 11/17/2023   Supervision of other normal pregnancy, antepartum 11/04/2023  Hx of chlamydia infection    GERD (gastroesophageal reflux disease)    Mild tetrahydrocannabinol (THC) abuse 04/13/2016    Assessment/Plan:  TIFFANE SHELDON is a 33 y.o. G6P5005 at [redacted]w[redacted]d here for elective IOL. History of late [facility] and grand multiparity   #Labor: IOL process explained to the patient. Will start with dual cytotec . Patient to consider FB in 4 hours at next check. #Pain: Planning epidural prior to AROM #FWB: Cat I #ID:  GBS (-) #MOF: Breast and bottle #MOC: Interval BTS. Wanted ppBTS but paperwork not signed until 12/26.  #Circ:  N/A  Barkley Angles, MD OB Fellow, Faculty Practice [facility] 04/23/2024 6:38 AM        [1] No Known Allergies

## 2024-04-23 NOTE — Discharge Summary (Signed)
 "    Postpartum Discharge Summary  Date of Service updated***     Patient Name: Bethany Gallagher DOB: 08-Sep-1990 MRN: [id number]  Date of admission: 04/23/2024 Delivery date:04/23/2024 Delivering provider: CLAUDENE, [place]  Date of discharge: 04/23/2024  Admitting diagnosis: Indication for care or intervention in labor or delivery [O75.9] Intrauterine pregnancy: [redacted]w[redacted]d     Secondary diagnosis:  Principal Problem:   Indication for care or intervention in labor or delivery  Additional problems: Limited prenatal care, grand multiparity    Discharge diagnosis: Term Pregnancy Delivered                                              Post partum procedures:{Postpartum procedures:23558} Augmentation: AROM and Cytotec  Complications: None  Hospital course: Induction of Labor With Vaginal Delivery   34 y.o. yo [id number] at [redacted]w[redacted]d was admitted to the hospital 04/23/2024 for induction of labor.  Indication for induction: Postdates.  Patient had an uncomplicated labor course complicated. Membrane Rupture Time/Date: 1:45 PM,04/23/2024  Delivery Method:Vaginal, Spontaneous Operative Delivery:N/A Episiotomy: None Lacerations:  None Details of delivery can be found in separate delivery note.  Patient had a postpartum course complicated by***. Patient is discharged home 04/23/24.  Newborn Data: Birth date:04/23/2024 Birth time:2:47 PM Gender:Female Living status:Living Apgars:9 ,9  Weight:3100 g  Magnesium  Sulfate received: {Mag received:30440022} BMZ received: No Rhophylac:N/A MMR:N/A T-DaP:{Tdap:23962} Flu: {Qol:76036} RSV Vaccine received: {RSV:31013} Transfusion:{Transfusion received:30440034}  Immunizations received: Immunization History  Administered Date(s) Administered   Tdap 06/17/2012    Physical exam  Vitals:   04/23/24 1530 04/23/24 1546 04/23/24 1632 04/23/24 1730  BP: 107/66 108/63 108/66 106/71  Pulse: 83 81 76 68  Resp:   16 18  Temp:   98.8 F (37.1 C) 98 F (36.7 C)   TempSrc:   Oral Oral  SpO2:   100% 100%  Weight:      Height:       General: {Exam; general:21111117} Lochia: {Desc; appropriate/inappropriate:30686::appropriate} Uterine Fundus: {Desc; firm/soft:30687} Incision: {Exam; incision:21111123} DVT Evaluation: {Exam; dvt:2111122} Labs: Lab Results  Component Value Date   WBC 5.8 04/23/2024   HGB 10.9 (L) 04/23/2024   HCT 33.4 (L) 04/23/2024   MCV 88.6 04/23/2024   PLT 271 04/23/2024      Latest Ref Rng & Units 04/23/2024    6:28 AM  CMP  Glucose 70 - 99 mg/dL 88   BUN 6 - 20 mg/dL 5   Creatinine 9.55 - 8.99 mg/dL 9.34   Sodium 864 - 854 mmol/L 135   Potassium 3.5 - 5.1 mmol/L 3.9   Chloride 98 - 111 mmol/L 102   CO2 22 - 32 mmol/L 21   Calcium 8.9 - 10.3 mg/dL 9.1   Total Protein 6.5 - 8.1 g/dL 6.7   Total Bilirubin 0.0 - 1.2 mg/dL 0.4   Alkaline Phos 38 - 126 U/L 239   AST 15 - 41 U/L 26   ALT 0 - 44 U/L 26    Edinburgh Score:    04/23/2024    5:30 PM  Edinburgh Postnatal Depression Scale Screening Tool  I have been able to laugh and see the funny side of things. 0  I have looked forward with enjoyment to things. 0  I have blamed myself unnecessarily when things went wrong. 2  I have been anxious or worried for no good reason. 2  I have felt scared or panicky  for no good reason. 0  Things have been getting on top of me. 1  I have been so unhappy that I have had difficulty sleeping. 1  I have felt sad or miserable. 1  I have been so unhappy that I have been crying. 1  The thought of harming myself has occurred to me. 0  Edinburgh Postnatal Depression Scale Total 8   Edinburgh Postnatal Depression Scale Total: 8   After visit meds:  Allergies as of 04/23/2024   No Known Allergies   Med Rec must be completed prior to using this [facility]***        Discharge home in stable condition Infant Feeding: {Baby feeding:23562} Infant Disposition:{CHL IP OB HOME WITH FNUYZM:76418} Discharge instruction: per  After Visit Summary and Postpartum booklet. Activity: Advance as tolerated. Pelvic rest for 6 weeks.  Diet: {OB ipzu:78888878} Future Appointments:No future appointments. Follow up Visit:   Please schedule this patient for a In person postpartum visit in 4 weeks with the following provider: MD. Additional Postpartum F/U:Consent for tubal  Low risk pregnancy complicated by: NA Delivery mode:  Vaginal, Spontaneous Anticipated Birth Control:  Plans Interval BTL   04/23/2024 Garen SHAUNNA Puffer, MD    "

## 2024-04-24 MED ORDER — ACETAMINOPHEN 325 MG PO TABS: 650.0000 mg | ORAL_TABLET | ORAL | 0 refills | Status: AC | PRN

## 2024-04-24 MED ORDER — IBUPROFEN 600 MG PO TABS: 600.0000 mg | ORAL_TABLET | Freq: Four times a day (QID) | ORAL | 0 refills | Status: AC

## 2024-04-24 MED ORDER — POLYETHYLENE GLYCOL 3350 17 GM/SCOOP PO POWD: 17.0000 g | Freq: Every day | ORAL | 1 refills | Status: AC | PRN

## 2024-04-24 NOTE — Lactation Note (Signed)
 This note was copied from a baby's chart. Lactation Consultation Note  Patient Name: Bethany Gallagher Unijb'd Date: 04/24/2024 Age:32 hours, P6  Reason for consult: Follow-up assessment;Term;Infant weight loss;Breastfeeding assistance, nurse requested. As LC entered room mom was starting to feed a bottle.  LC offered to assist to see if the baby would breast feed. Mom receptive. LC changed a large wet and a stool ( transitioning).  LC replaced the cotton balls for a Urine sample.  LC assisted to latch on the right breast, and noted swollen Montgomery glands and some edema making it difficult for the baby to maintain depth at the breast. Over 15 mins of on / off latch the baby only sustained a latched 5 mins , but had good swallows  every latch.  Mom has copious amounts of colostrum and easily hand expressed onto a spoon. Baby spoon fed well and was able to stretch her tongue well over the gum line. After feeding baby STS on moms chest.  LC recommended steps for latching, hand express,pre-pump, reverse pressure and coconut oil between feedings with shells to work on the tissue. All 3 breast feeding tools provided.  24 feeding goals reviewed.   Maternal Data Has patient been taught Hand Expression?: Yes (excellent flow, easy to hand express,)  Feeding Mother's Current Feeding Choice: Breast Milk and Formula  LATCH Score Latch: Repeated attempts needed to sustain latch, nipple held in mouth throughout feeding, stimulation needed to elicit sucking reflex.  Audible Swallowing: Spontaneous and intermittent  Type of Nipple: Everted at rest and after stimulation (areola edema and swollen areolas - LC showed mom how to do the reverse pressure and recommended steps for laughing.)  Comfort (Breast/Nipple): Soft / non-tender  Hold (Positioning): Full assist, staff holds infant at breast  LATCH Score: 7   Lactation Tools Discussed/Used Tools: Shells;Pump;Flanges Flange Size: 21 Breast  pump type: Manual Pump Education: Setup, frequency, and cleaning;Milk Storage  Interventions Interventions: Breast feeding basics reviewed;Assisted with latch;Skin to skin;Breast massage;Hand express;Pre-pump if needed;Reverse pressure;Breast compression;Adjust position;Support pillows;Position options;Shells;Hand pump;Education;LC Services brochure;CDC milk storage guidelines;CDC Guidelines for Breast Pump Cleaning  Discharge Pump: Personal;Manual;DEBP  Consult Status Consult Status: Follow-up Date: 04/25/24 Follow-up type: In-patient    Rollene Caldron Andre Gallego 04/24/2024, 10:01 AM

## 2024-04-24 NOTE — Clinical Social Work Maternal (Signed)
 " CLINICAL SOCIAL WORK MATERNAL/CHILD NOTE  Patient Details  Name: JOREEN SWEARINGIN MRN: [id number] Date of Birth: [birth year]  Date:  04/24/2024  Clinical Social Worker Initiating Note:  Rosina Molt Date/Time: Initiated:  04/24/24/1529     Child's Name:  Azonia Gainey   Biological Parents:  Mother, Father Samyria Rudie 1990-07-28 Terrye Ligas 07-05-1998)   Need for Interpreter:  None   Reason for Referral:  Current Substance Use/Substance Use During Pregnancy     Address:  [address] [place] [address]    Phone number:  [phone] (home)     Additional phone number:   Household Members/Support Persons (HM/SP):   Household Member/Support Person 1, Household Member/Support Person 2, Household Member/Support Person 3, Household Member/Support Person 4, Household Member/Support Person 5, Household Member/Support Person 6   HM/SP Name Relationship DOB or Age  HM/SP -1 Demetrius Gainey FOB 07-05-1998  HM/SP -2 Alexandria Moore-Harrisonn (lives with her mom) Daughter 05-03-2010  HM/SP -3 Anyah Swallow (lives with her mom) Daughter 12-31-2011  HM/SP -4 Lincoln Raguel [place] 11-23-2014  HM/SP -5 Clarice Zulauf 06-09-2016  HM/SP -6 [address] [place] 08-08-2017  HM/SP -7        HM/SP -8          Natural Supports (not living in the home):  Spouse/significant other, Immediate Family   Professional Supports: None   Employment: Unemployed   Type of Work:     Education:  9 to 11 years   Homebound arranged:    Surveyor, Quantity Resources:  Medicaid   Other Resources:  [employer], Sales Executive     Cultural/Religious Considerations Which May Impact Care:    Strengths:  Ability to meet basic needs  , Home prepared for child  , Pediatrician chosen   Psychotropic Medications:         Pediatrician:    [employer] area  Pediatrician List:   [facility] ([address] [employer])  [place]    [place]      Pediatrician Fax Number:    Risk Factors/Current Problems:  Substance Use     Cognitive State:  Able to Concentrate  , Alert  , Insightful  , Goal Oriented  , Linear Thinking     Mood/Affect:  Calm  , Comfortable  , Interested  , Relaxed     CSW Assessment: CSW received a consult due to [facility] use during pregnancy. CSW met MOB at bedside to complete a full psychosocial assessment and offer support. CSW entered the room introduced herself and acknowledged her family was present. CSW asked MOB for privacy reasons could her guest stepout for the assessment; MOB was agreeable and her guest stepped out. CSW explained her role and the reason for the visit. MOB presented bonding/holding the infant as they lay in bed. MOB was polite, easy to engage, receptive to meeting with CSW, and appeared forthcoming.  CSW collected MOB's demographic information; and MOB reported both of her daughters Alexandria Moore-Harrison (05-03-2010) and Azaria Glassberg-Hairston currently reside with her mom Brooksie Gearing). MOB reported at the birth of her daughters they all lived with her mom and once MOB moved out, her daughters stayed behind with her mom. MOB denied CPS hx and involvement of placement with her daughters.  CSW inquired about MOB's mental health history. MOB denied any/all mental health history. CSW provided education regarding the baby blues period vs.  perinatal mood disorders, discussed treatment and gave resources for mental health follow up if concerns arise.  CSW recommends self-evaluation during the postpartum time period using the New Mom Checklist from Postpartum Progress and encouraged MOB to contact a medical professional if symptoms are noted at any time.  CSW assessed for safety with MOB SI/HI/DV;MOB denied all.  CSW asked MOB does she receive support resources; MOB said yes(WIC and food stamps). MOB reported having all essential items for the infant including a  carseat, bassinet and crib for safe sleeping. CSW provided review of Sudden Infant Death Syndrome (SIDS) precautions.   CSW informed MOB due to [facility] during her pregnancy; the hospital will perform a UDS and CDS on the infant. If the screenings return with positive results a report to CPS will be made; MOB was understanding. MOB reported her last use of THC was 2 months ago due to low appetite. MOB denied use of any other illicit substances.  CSW will continue to monitor the UDS/CDS and complete a CPS report if warranted.   CSW Plan/Description:  No Further Intervention Required/No Barriers to Discharge, Sudden Infant Death Syndrome (SIDS) Education, Perinatal Mood and Anxiety Disorder (PMADs) Education, Hospital Drug Screen Policy Information, Other Information/Referral to [facility], CSW Will Continue to Monitor Umbilical Cord Tissue Drug Screen Results and Make Report if Ranelle Rosina MARLA Joshua KEN 04/24/2024, 3:35 PM  "

## 2024-04-25 NOTE — Lactation Note (Signed)
 This note was copied from a baby's chart. Lactation Consultation Note  Patient Name: Bethany Gallagher Unijb'd Date: 04/25/2024 Age:32 hours Reason for consult: Follow-up assessment;Term  P6, Baby has primarily been formula fed. Reviewed engorgement care and monitoring voids/stools. Mother does not have questions or concerns at this time.   Maternal Data Does the patient have breastfeeding experience prior to this delivery?: Yes  Feeding Mother's Current Feeding Choice: Breast Milk and Formula  Interventions Interventions: Education  Discharge Discharge Education: Engorgement and breast care;Warning signs for feeding baby  Consult Status Consult Status: Complete Date: 04/25/24   Shannon Dines Boschen  RN, IBCLC 04/25/2024, 1:03 PM

## 2024-04-25 NOTE — Anesthesia Postprocedure Evaluation (Signed)
"   Anesthesia Post Note  Patient: Bethany Gallagher  Procedure(s) Performed: AN AD HOC LABOR EPIDURAL     Patient location during evaluation: Other Anesthesia Type: Epidural Level of consciousness: awake and alert and oriented Pain management: satisfactory to patient Vital Signs Assessment: post-procedure vital signs reviewed and stable Respiratory status: respiratory function stable Cardiovascular status: stable Postop Assessment: no headache, no backache, epidural receding, patient able to bend at knees, no signs of nausea or vomiting, adequate PO intake and able to ambulate Anesthetic complications: no   No notable events documented.  Last Vitals:  Vitals:   04/24/24 0035 04/24/24 0529  BP: 98/70 113/80  Pulse: 72 72  Resp:  18  Temp: 36.7 C   SpO2: 100% 100%    Last Pain:  Vitals:   04/24/24 1152  TempSrc:   PainSc: 7    Pain Goal:                   [name]      "

## 2024-05-04 ENCOUNTER — Telehealth (HOSPITAL_COMMUNITY): Payer: Self-pay | Admitting: *Deleted

## 2024-05-04 NOTE — Telephone Encounter (Signed)
 05/04/2024  Name: Bethany Gallagher MRN: [id number] DOB: Jan 01, 1991  Reason for Call:  Transition of Care Hospital Discharge Call  Contact Status: Patient Contact Status: Unable to contact  Language assistant needed:          Follow-Up Questions:    Van Postnatal Depression Scale:  In the Past 7 Days:    PHQ2-9 Depression Scale:     Discharge Follow-up:    Post-discharge interventions: NA  Signature Allean IVAR Carton, RN, 05/04/24, 1000

## 2024-05-29 ENCOUNTER — Ambulatory Visit: Payer: Self-pay | Admitting: Obstetrics and Gynecology
